# Patient Record
Sex: Female | Born: 1935 | Race: White | Hispanic: No | Marital: Married | Smoking: Former smoker
Health system: Southern US, Community
[De-identification: ages and names within clinical notes are randomized; demographics above are authoritative.]

## PROBLEM LIST (undated history)

## (undated) DIAGNOSIS — F0280 Dementia in other diseases classified elsewhere without behavioral disturbance: Secondary | ICD-10-CM

## (undated) DIAGNOSIS — E785 Hyperlipidemia, unspecified: Secondary | ICD-10-CM

## (undated) DIAGNOSIS — M8580 Other specified disorders of bone density and structure, unspecified site: Secondary | ICD-10-CM

## (undated) DIAGNOSIS — J189 Pneumonia, unspecified organism: Secondary | ICD-10-CM

## (undated) DIAGNOSIS — I10 Essential (primary) hypertension: Secondary | ICD-10-CM

## (undated) DIAGNOSIS — K5790 Diverticulosis of intestine, part unspecified, without perforation or abscess without bleeding: Secondary | ICD-10-CM

## (undated) DIAGNOSIS — C50912 Malignant neoplasm of unspecified site of left female breast: Secondary | ICD-10-CM

## (undated) DIAGNOSIS — G309 Alzheimer's disease, unspecified: Secondary | ICD-10-CM

## (undated) HISTORY — DX: Diverticulosis of intestine, part unspecified, without perforation or abscess without bleeding: K57.90

## (undated) HISTORY — DX: Essential (primary) hypertension: I10

## (undated) HISTORY — DX: Alzheimer's disease, unspecified: G30.9

## (undated) HISTORY — DX: Dementia in other diseases classified elsewhere, unspecified severity, without behavioral disturbance, psychotic disturbance, mood disturbance, and anxiety: F02.80

## (undated) HISTORY — DX: Other specified disorders of bone density and structure, unspecified site: M85.80

## (undated) HISTORY — DX: Hyperlipidemia, unspecified: E78.5

## (undated) HISTORY — PX: CATARACT EXTRACTION W/ INTRAOCULAR LENS IMPLANT: SHX1309

---

## 1998-02-18 ENCOUNTER — Other Ambulatory Visit: Admission: RE | Admit: 1998-02-18 | Discharge: 1998-02-18 | Payer: Self-pay | Admitting: Obstetrics & Gynecology

## 1998-12-16 ENCOUNTER — Encounter: Payer: Self-pay | Admitting: Family Medicine

## 1998-12-31 ENCOUNTER — Other Ambulatory Visit: Admission: RE | Admit: 1998-12-31 | Discharge: 1998-12-31 | Payer: Self-pay | Admitting: Internal Medicine

## 1999-06-13 ENCOUNTER — Emergency Department (HOSPITAL_COMMUNITY): Admission: EM | Admit: 1999-06-13 | Discharge: 1999-06-13 | Payer: Self-pay | Admitting: Emergency Medicine

## 1999-06-13 ENCOUNTER — Encounter: Payer: Self-pay | Admitting: Emergency Medicine

## 2000-01-13 ENCOUNTER — Other Ambulatory Visit: Admission: RE | Admit: 2000-01-13 | Discharge: 2000-01-13 | Payer: Self-pay | Admitting: Gastroenterology

## 2000-01-13 ENCOUNTER — Encounter (INDEPENDENT_AMBULATORY_CARE_PROVIDER_SITE_OTHER): Payer: Self-pay | Admitting: Specialist

## 2001-10-28 ENCOUNTER — Encounter: Payer: Self-pay | Admitting: Orthopaedic Surgery

## 2001-10-28 ENCOUNTER — Emergency Department (HOSPITAL_COMMUNITY): Admission: EM | Admit: 2001-10-28 | Discharge: 2001-10-28 | Payer: Self-pay | Admitting: Emergency Medicine

## 2004-01-21 ENCOUNTER — Ambulatory Visit: Payer: Self-pay | Admitting: Internal Medicine

## 2004-02-02 ENCOUNTER — Ambulatory Visit: Payer: Self-pay | Admitting: Internal Medicine

## 2005-01-12 ENCOUNTER — Ambulatory Visit: Payer: Self-pay | Admitting: Internal Medicine

## 2005-01-19 ENCOUNTER — Ambulatory Visit: Payer: Self-pay | Admitting: Internal Medicine

## 2005-02-14 DIAGNOSIS — C50912 Malignant neoplasm of unspecified site of left female breast: Secondary | ICD-10-CM

## 2005-02-14 HISTORY — DX: Malignant neoplasm of unspecified site of left female breast: C50.912

## 2005-02-14 HISTORY — PX: BREAST LUMPECTOMY: SHX2

## 2005-07-06 ENCOUNTER — Ambulatory Visit: Payer: Self-pay | Admitting: Internal Medicine

## 2005-07-29 ENCOUNTER — Ambulatory Visit: Payer: Self-pay | Admitting: Internal Medicine

## 2005-09-20 ENCOUNTER — Ambulatory Visit: Payer: Self-pay | Admitting: Cardiology

## 2005-09-20 ENCOUNTER — Ambulatory Visit: Payer: Self-pay | Admitting: Internal Medicine

## 2005-09-20 DIAGNOSIS — G252 Other specified forms of tremor: Secondary | ICD-10-CM

## 2005-09-20 DIAGNOSIS — Z8669 Personal history of other diseases of the nervous system and sense organs: Secondary | ICD-10-CM

## 2005-09-20 DIAGNOSIS — G250 Essential tremor: Secondary | ICD-10-CM

## 2005-09-27 ENCOUNTER — Ambulatory Visit: Payer: Self-pay | Admitting: Family Medicine

## 2005-11-28 ENCOUNTER — Ambulatory Visit: Payer: Self-pay | Admitting: Family Medicine

## 2006-01-18 ENCOUNTER — Encounter: Payer: Self-pay | Admitting: Family Medicine

## 2006-05-12 ENCOUNTER — Encounter: Payer: Self-pay | Admitting: Family Medicine

## 2006-05-12 DIAGNOSIS — I10 Essential (primary) hypertension: Secondary | ICD-10-CM

## 2006-05-12 DIAGNOSIS — M8580 Other specified disorders of bone density and structure, unspecified site: Secondary | ICD-10-CM

## 2006-05-12 DIAGNOSIS — E7800 Pure hypercholesterolemia, unspecified: Secondary | ICD-10-CM

## 2006-05-12 DIAGNOSIS — K5730 Diverticulosis of large intestine without perforation or abscess without bleeding: Secondary | ICD-10-CM | POA: Insufficient documentation

## 2006-05-12 DIAGNOSIS — N952 Postmenopausal atrophic vaginitis: Secondary | ICD-10-CM | POA: Insufficient documentation

## 2006-11-08 ENCOUNTER — Encounter (INDEPENDENT_AMBULATORY_CARE_PROVIDER_SITE_OTHER): Payer: Self-pay | Admitting: *Deleted

## 2006-11-09 ENCOUNTER — Ambulatory Visit: Payer: Self-pay | Admitting: Family Medicine

## 2006-11-14 ENCOUNTER — Encounter: Payer: Self-pay | Admitting: Family Medicine

## 2007-01-10 ENCOUNTER — Ambulatory Visit: Payer: Self-pay | Admitting: *Deleted

## 2007-01-10 ENCOUNTER — Observation Stay (HOSPITAL_COMMUNITY): Admission: EM | Admit: 2007-01-10 | Discharge: 2007-01-10 | Payer: Self-pay | Admitting: Emergency Medicine

## 2007-01-17 ENCOUNTER — Ambulatory Visit: Payer: Self-pay | Admitting: Family Medicine

## 2007-01-26 ENCOUNTER — Ambulatory Visit: Payer: Self-pay | Admitting: Cardiology

## 2007-01-26 ENCOUNTER — Ambulatory Visit (HOSPITAL_COMMUNITY): Admission: RE | Admit: 2007-01-26 | Discharge: 2007-01-26 | Payer: Self-pay | Admitting: Cardiology

## 2007-05-21 ENCOUNTER — Encounter: Payer: Self-pay | Admitting: Family Medicine

## 2007-06-01 ENCOUNTER — Encounter (INDEPENDENT_AMBULATORY_CARE_PROVIDER_SITE_OTHER): Payer: Self-pay | Admitting: *Deleted

## 2007-06-20 ENCOUNTER — Ambulatory Visit: Payer: Self-pay | Admitting: Family Medicine

## 2007-06-20 DIAGNOSIS — L821 Other seborrheic keratosis: Secondary | ICD-10-CM

## 2007-08-02 ENCOUNTER — Ambulatory Visit: Payer: Self-pay | Admitting: Cardiology

## 2008-01-24 ENCOUNTER — Ambulatory Visit: Payer: Self-pay | Admitting: Family Medicine

## 2008-01-24 DIAGNOSIS — L259 Unspecified contact dermatitis, unspecified cause: Secondary | ICD-10-CM | POA: Insufficient documentation

## 2008-01-29 ENCOUNTER — Ambulatory Visit: Payer: Self-pay | Admitting: Family Medicine

## 2008-01-30 LAB — CONVERTED CEMR LAB
ALT: 12 units/L (ref 0–35)
Bilirubin, Direct: 0.1 mg/dL (ref 0.0–0.3)
CO2: 31 meq/L (ref 19–32)
Calcium: 9.4 mg/dL (ref 8.4–10.5)
Chloride: 104 meq/L (ref 96–112)
Creatinine, Ser: 0.6 mg/dL (ref 0.4–1.2)
Direct LDL: 152.3 mg/dL
GFR calc non Af Amer: 104 mL/min
Sodium: 140 meq/L (ref 135–145)
Total Bilirubin: 0.8 mg/dL (ref 0.3–1.2)
Total CHOL/HDL Ratio: 4
VLDL: 13 mg/dL (ref 0–40)

## 2008-02-19 ENCOUNTER — Encounter: Payer: Self-pay | Admitting: Family Medicine

## 2008-02-19 ENCOUNTER — Ambulatory Visit: Payer: Self-pay | Admitting: Internal Medicine

## 2008-02-19 ENCOUNTER — Ambulatory Visit: Payer: Self-pay | Admitting: Gastroenterology

## 2008-03-05 ENCOUNTER — Ambulatory Visit: Payer: Self-pay | Admitting: Gastroenterology

## 2008-03-05 ENCOUNTER — Encounter: Payer: Self-pay | Admitting: Gastroenterology

## 2008-03-06 ENCOUNTER — Encounter: Payer: Self-pay | Admitting: Gastroenterology

## 2008-03-31 ENCOUNTER — Ambulatory Visit: Payer: Self-pay | Admitting: Family Medicine

## 2008-05-05 ENCOUNTER — Encounter (INDEPENDENT_AMBULATORY_CARE_PROVIDER_SITE_OTHER): Payer: Self-pay | Admitting: *Deleted

## 2008-05-09 ENCOUNTER — Ambulatory Visit: Payer: Self-pay | Admitting: Family Medicine

## 2008-05-14 LAB — CONVERTED CEMR LAB
Cholesterol: 224 mg/dL — ABNORMAL HIGH (ref 0–200)
HDL: 46.8 mg/dL (ref >39.00)
Total CHOL/HDL Ratio: 5
VLDL: 20.4 mg/dL (ref 0.0–40.0)

## 2008-05-22 ENCOUNTER — Encounter: Payer: Self-pay | Admitting: Family Medicine

## 2008-05-26 ENCOUNTER — Encounter: Payer: Self-pay | Admitting: Family Medicine

## 2008-05-27 ENCOUNTER — Encounter: Payer: Self-pay | Admitting: Family Medicine

## 2008-07-01 ENCOUNTER — Encounter: Payer: Self-pay | Admitting: Family Medicine

## 2008-07-02 ENCOUNTER — Encounter: Payer: Self-pay | Admitting: Family Medicine

## 2008-07-03 ENCOUNTER — Telehealth: Payer: Self-pay | Admitting: Family Medicine

## 2008-07-04 ENCOUNTER — Ambulatory Visit: Payer: Self-pay | Admitting: Family Medicine

## 2008-07-10 ENCOUNTER — Encounter: Admission: RE | Admit: 2008-07-10 | Discharge: 2008-07-10 | Payer: Self-pay | Admitting: Otolaryngology

## 2008-07-10 LAB — CONVERTED CEMR LAB
BUN: 14 mg/dL (ref 6–23)
Calcium: 9.8 mg/dL (ref 8.4–10.5)
Creatinine, Ser: 0.71 mg/dL (ref 0.40–1.20)
Glucose, Bld: 87 mg/dL (ref 70–99)
Potassium: 5.1 meq/L (ref 3.5–5.3)

## 2008-07-11 DIAGNOSIS — D0512 Intraductal carcinoma in situ of left breast: Secondary | ICD-10-CM | POA: Insufficient documentation

## 2008-07-15 ENCOUNTER — Encounter: Admission: RE | Admit: 2008-07-15 | Discharge: 2008-07-15 | Payer: Self-pay | Admitting: Surgery

## 2008-07-17 ENCOUNTER — Ambulatory Visit (HOSPITAL_BASED_OUTPATIENT_CLINIC_OR_DEPARTMENT_OTHER): Admission: RE | Admit: 2008-07-17 | Discharge: 2008-07-17 | Payer: Self-pay | Admitting: Surgery

## 2008-07-17 ENCOUNTER — Encounter: Admission: RE | Admit: 2008-07-17 | Discharge: 2008-07-17 | Payer: Self-pay | Admitting: Surgery

## 2008-07-17 ENCOUNTER — Encounter (INDEPENDENT_AMBULATORY_CARE_PROVIDER_SITE_OTHER): Payer: Self-pay | Admitting: Surgery

## 2008-07-24 ENCOUNTER — Encounter: Payer: Self-pay | Admitting: Family Medicine

## 2008-07-28 ENCOUNTER — Ambulatory Visit: Payer: Self-pay | Admitting: Oncology

## 2008-08-13 ENCOUNTER — Ambulatory Visit: Payer: Self-pay | Admitting: Family Medicine

## 2008-08-19 ENCOUNTER — Encounter: Payer: Self-pay | Admitting: Family Medicine

## 2008-08-19 ENCOUNTER — Ambulatory Visit: Admission: RE | Admit: 2008-08-19 | Discharge: 2008-10-15 | Payer: Self-pay | Admitting: Radiation Oncology

## 2008-08-19 LAB — CBC WITH DIFFERENTIAL (CANCER CENTER ONLY)
BASO#: 0.1 10*3/uL (ref 0.0–0.2)
HCT: 38.9 % (ref 34.8–46.6)
HGB: 13.7 g/dL (ref 11.6–15.9)
LYMPH#: 1.8 10*3/uL (ref 0.9–3.3)
MCHC: 35.2 g/dL (ref 32.0–36.0)
MONO#: 0.5 10*3/uL (ref 0.1–0.9)
NEUT%: 67.2 % (ref 39.6–80.0)

## 2008-08-19 LAB — CMP (CANCER CENTER ONLY)
ALT(SGPT): 16 U/L (ref 10–47)
Albumin: 3.5 g/dL (ref 3.3–5.5)
CO2: 29 mEq/L (ref 18–33)
Calcium: 9.5 mg/dL (ref 8.0–10.3)
Chloride: 97 mEq/L — ABNORMAL LOW (ref 98–108)
Sodium: 138 mEq/L (ref 128–145)
Total Protein: 7.1 g/dL (ref 6.4–8.1)

## 2008-08-20 ENCOUNTER — Encounter: Payer: Self-pay | Admitting: Family Medicine

## 2008-08-20 LAB — CONVERTED CEMR LAB
AST: 17 units/L (ref 0–37)
Cholesterol: 151 mg/dL (ref 0–200)
Triglycerides: 61 mg/dL (ref 0.0–149.0)

## 2008-10-09 ENCOUNTER — Ambulatory Visit: Payer: Self-pay | Admitting: Oncology

## 2008-10-12 ENCOUNTER — Encounter: Payer: Self-pay | Admitting: Family Medicine

## 2008-10-14 ENCOUNTER — Encounter: Payer: Self-pay | Admitting: Family Medicine

## 2008-10-14 LAB — CBC WITH DIFFERENTIAL (CANCER CENTER ONLY)
BASO%: 0.5 % (ref 0.0–2.0)
HCT: 39.7 % (ref 34.8–46.6)
HGB: 13.4 g/dL (ref 11.6–15.9)
LYMPH#: 1.2 10*3/uL (ref 0.9–3.3)
MONO#: 0.3 10*3/uL (ref 0.1–0.9)
NEUT%: 68.3 % (ref 39.6–80.0)
RBC: 4.16 10*6/uL (ref 3.70–5.32)
RDW: 11.4 % (ref 10.5–14.6)
WBC: 5.3 10*3/uL (ref 3.9–10.0)

## 2008-10-14 LAB — CMP (CANCER CENTER ONLY)
BUN, Bld: 11 mg/dL (ref 7–22)
CO2: 31 mEq/L (ref 18–33)
Calcium: 9.3 mg/dL (ref 8.0–10.3)
Chloride: 99 mEq/L (ref 98–108)
Creat: 0.7 mg/dl (ref 0.6–1.2)
Total Bilirubin: 0.5 mg/dl (ref 0.20–1.60)

## 2008-12-11 ENCOUNTER — Ambulatory Visit: Payer: Self-pay | Admitting: Oncology

## 2008-12-16 ENCOUNTER — Encounter: Payer: Self-pay | Admitting: Family Medicine

## 2008-12-16 LAB — CBC WITH DIFFERENTIAL (CANCER CENTER ONLY)
BASO%: 0.5 % (ref 0.0–2.0)
LYMPH#: 1.6 10*3/uL (ref 0.9–3.3)
LYMPH%: 23.4 % (ref 14.0–48.0)
MONO#: 0.4 10*3/uL (ref 0.1–0.9)
NEUT#: 4.6 10*3/uL (ref 1.5–6.5)
Platelets: 249 10*3/uL (ref 145–400)
RDW: 11.2 % (ref 10.5–14.6)
WBC: 6.7 10*3/uL (ref 3.9–10.0)

## 2008-12-16 LAB — CMP (CANCER CENTER ONLY)
ALT(SGPT): 17 U/L (ref 10–47)
Alkaline Phosphatase: 60 U/L (ref 26–84)
CO2: 30 mEq/L (ref 18–33)
Creat: 0.6 mg/dl (ref 0.6–1.2)
Sodium: 138 mEq/L (ref 128–145)
Total Bilirubin: 0.3 mg/dl (ref 0.20–1.60)
Total Protein: 6.7 g/dL (ref 6.4–8.1)

## 2009-02-18 ENCOUNTER — Encounter: Payer: Self-pay | Admitting: Family Medicine

## 2009-03-03 ENCOUNTER — Ambulatory Visit: Payer: Self-pay | Admitting: Family Medicine

## 2009-06-05 ENCOUNTER — Encounter: Payer: Self-pay | Admitting: Family Medicine

## 2009-06-09 LAB — HM MAMMOGRAPHY

## 2009-07-23 ENCOUNTER — Ambulatory Visit: Payer: Self-pay | Admitting: Family Medicine

## 2009-07-24 LAB — CONVERTED CEMR LAB
AST: 17 units/L (ref 0–37)
Albumin: 3.8 g/dL (ref 3.5–5.2)
BUN: 10 mg/dL (ref 6–23)
Calcium: 9 mg/dL (ref 8.4–10.5)
Cholesterol: 190 mg/dL (ref 0–200)
Creatinine, Ser: 0.5 mg/dL (ref 0.4–1.2)
GFR calc non Af Amer: 117.39 mL/min (ref >60)
Glucose, Bld: 88 mg/dL (ref 70–99)
HDL: 50.3 mg/dL (ref >39.00)
LDL Cholesterol: 111 mg/dL — ABNORMAL HIGH (ref 0–99)
Potassium: 4.5 meq/L (ref 3.5–5.1)
Total Bilirubin: 0.6 mg/dL (ref 0.3–1.2)
Triglycerides: 145 mg/dL (ref 0.0–149.0)
VLDL: 29 mg/dL (ref 0.0–40.0)

## 2009-07-31 ENCOUNTER — Ambulatory Visit: Payer: Self-pay | Admitting: Family Medicine

## 2009-07-31 LAB — CONVERTED CEMR LAB

## 2009-09-16 ENCOUNTER — Encounter: Payer: Self-pay | Admitting: Family Medicine

## 2009-11-10 ENCOUNTER — Ambulatory Visit: Payer: Self-pay | Admitting: Family Medicine

## 2009-11-10 DIAGNOSIS — L84 Corns and callosities: Secondary | ICD-10-CM | POA: Insufficient documentation

## 2009-12-10 ENCOUNTER — Ambulatory Visit: Payer: Self-pay | Admitting: Oncology

## 2009-12-16 ENCOUNTER — Encounter: Payer: Self-pay | Admitting: Family Medicine

## 2009-12-16 LAB — CBC WITH DIFFERENTIAL (CANCER CENTER ONLY)
BASO%: 0.4 % (ref 0.0–2.0)
EOS%: 2.2 % (ref 0.0–7.0)
HCT: 37.1 % (ref 34.8–46.6)
LYMPH%: 26.5 % (ref 14.0–48.0)
MCH: 32.7 pg (ref 26.0–34.0)
MCHC: 34.5 g/dL (ref 32.0–36.0)
MCV: 95 fL (ref 81–101)
MONO%: 8 % (ref 0.0–13.0)
NEUT%: 62.9 % (ref 39.6–80.0)
RDW: 11.3 % (ref 10.5–14.6)

## 2009-12-16 LAB — CMP (CANCER CENTER ONLY)
ALT(SGPT): 19 U/L (ref 10–47)
AST: 18 U/L (ref 11–38)
CO2: 30 mEq/L (ref 18–33)
Creat: 0.6 mg/dl (ref 0.6–1.2)
Total Bilirubin: 0.5 mg/dl (ref 0.20–1.60)

## 2010-01-26 ENCOUNTER — Ambulatory Visit: Payer: Self-pay | Admitting: Family Medicine

## 2010-03-08 ENCOUNTER — Encounter: Payer: Self-pay | Admitting: Radiation Oncology

## 2010-03-12 ENCOUNTER — Ambulatory Visit: Payer: Self-pay | Admitting: Oncology

## 2010-03-16 ENCOUNTER — Encounter: Payer: Self-pay | Admitting: Family Medicine

## 2010-03-16 LAB — CBC WITH DIFFERENTIAL/PLATELET
BASO%: 0.5 % (ref 0.0–2.0)
Basophils Absolute: 0 10*3/uL (ref 0.0–0.1)
EOS%: 1.4 % (ref 0.0–7.0)
HGB: 13.1 g/dL (ref 11.6–15.9)
MCH: 33 pg (ref 25.1–34.0)
MONO#: 0.5 10*3/uL (ref 0.1–0.9)
RDW: 12.9 % (ref 11.2–14.5)
WBC: 6.7 10*3/uL (ref 3.9–10.3)
lymph#: 1.4 10*3/uL (ref 0.9–3.3)

## 2010-03-16 LAB — COMPREHENSIVE METABOLIC PANEL
ALT: <8 U/L (ref 0–35)
AST: 14 U/L (ref 0–37)
Albumin: 4 g/dL (ref 3.5–5.2)
BUN: 11 mg/dL (ref 6–23)
CO2: 27 mEq/L (ref 19–32)
Calcium: 9.3 mg/dL (ref 8.4–10.5)
Chloride: 101 mEq/L (ref 96–112)
Potassium: 4.6 mEq/L (ref 3.5–5.3)

## 2010-03-16 NOTE — Assessment & Plan Note (Signed)
Summary: place on big toe on left foot/rbh   Vital Signs:  Patient profile:   77 year old female Height:      67 inches Weight:      160.0 pounds BMI:     25.15 Temp:     98.8 degrees F oral Pulse rate:   64 / minute Pulse rhythm:   regular BP sitting:   140 / 78  (left arm) Cuff size:   regular  Vitals Entered By: Benny Lennert CMA Duncan Dull) (November 10, 2009 2:21 PM)  History of Present Illness: Chief complaint place on bottom of left great toe  At base of left great toe...large callus, come bloody discoloration.  Minimally painful.  No discharge.  No fever.  No redness on feet.   No personal history of diabetes.  Problems Prior to Update: 1)  Preventive Health Care  (ICD-V70.0) 2)  Carcinoma in Situ of Breast  (ICD-233.0) 3)  Dermatitis, Hands  (ICD-692.9) 4)  Seborrheic Keratosis  (ICD-702.19) 5)  Syncope, Hx of  (ICD-V12.49) 6)  Vaginitis, Atrophic  (ICD-627.3) 7)  Hypercholesterolemia  (ICD-272.0) 8)  Tremor, Essential  (ICD-333.1) 9)  Bradycardia / Atenolol  (ICD-427.89) 10)  Osteopenia  (ICD-733.90) 11)  Hypertension  (ICD-401.9) 12)  Diverticulosis, Colon  (ICD-562.10)  Current Medications (verified): 1)  Atenolol 25 Mg  Tabs (Atenolol) .... Take One By Mouth Once A Day 2)  Lisinopril 10 Mg  Tabs (Lisinopril) .... Take 1 Tablet By Mouth Once A Day 3)  Calcium Carbonate-Vitamin D 600-400 Mg-Unit  Tabs (Calcium Carbonate-Vitamin D) .... Take 1 Tablet By Mouth Once A Day 4)  Tamoxifen Citrate 20 Mg Tabs (Tamoxifen Citrate) .... Take 1 Tablet By Mouth Once A Day  Allergies: 1)  ! Sulfa  Past History:  Past medical, surgical, family and social histories (including risk factors) reviewed, and no changes noted (except as noted below).  Past Medical History: Reviewed history from 05/12/2006 and no changes required. Diverticulosis, colon Hypertension Osteopenia  Past Surgical History: Reviewed history from 03/03/2009 and no changes required. DEXA T 1-5  2006 HEAD CT NEG 09/2005 CXR NEG 1994 ENROLLED IN CLIP PROGRAM [id number] left breast lumpectomy   Family History: Reviewed history from 06/20/2007 and no changes required. no family  hx of skin cancer  Social History: Reviewed history and no changes required.  Review of Systems General:  Denies fatigue and fever. CV:  Denies chest pain or discomfort and swelling of feet. Resp:  Denies shortness of breath.  Physical Exam  General:  Well-developed,well-nourished,in no acute distress; alert,appropriate and cooperative throughout examination Msk:  left great toe...at base of toe pad..lagre callus with rough edges. Also right 4th digit with corn between toes. Cleaned areas with alcohol, debrided with 15 blade.  No bleeding, no pain Pulses:  R and L posterior tibial pulses are full and equal bilaterally  Extremities:  no edmea   Impression & Recommendations:  Problem # 1:  CALLUS, TOE (ICD-700) Debrided two areas of callus...procedure note in PE section.   Complete Medication List: 1)  Atenolol 25 Mg Tabs (Atenolol) .... Take one by mouth once a day 2)  Lisinopril 10 Mg Tabs (Lisinopril) .... Take 1 tablet by mouth once a day 3)  Calcium Carbonate-vitamin D 600-400 Mg-unit Tabs (Calcium carbonate-vitamin d) .... Take 1 tablet by mouth once a day 4)  Tamoxifen Citrate 20 Mg Tabs (Tamoxifen citrate) .... Take 1 tablet by mouth once a day  Other Orders: Shave Skin Lesion < 0.5 cm/trunk/arm/leg (11300)  Patient Instructions: 1)  Kersal to soften feet nightly. 2)   Consider wearing corn pads to prevent areas from rubbing on shoes and other toes.  3)  Follow up as needed.   Current Allergies (reviewed today): ! SULFA Flu Vaccine Next Due:  Refused

## 2010-03-16 NOTE — Letter (Signed)
Summary: [facility]  [facility]   Imported By: Lanelle Bal 12/28/2009 12:24:47  _____________________________________________________________________  External Attachment:    Type:   Image     Comment:   External Document

## 2010-03-16 NOTE — Letter (Signed)
Summary: [facility]   Imported By: Lester  10/21/2009 12:41:30  _____________________________________________________________________  External Attachment:    Type:   Image     Comment:   External Document

## 2010-03-16 NOTE — Assessment & Plan Note (Signed)
Summary: REFILL MEDICATION/CLE   Vital Signs:  Patient profile:   77 year old female Height:      67 inches Weight:      157.0 pounds BMI:     24.68 Temp:     99.0 degrees F oral Pulse rate:   60 / minute Pulse rhythm:   regular BP sitting:   130 / 70  (left arm) Cuff size:   regular  Vitals Entered By: Benny Lennert CMA Duncan Dull) (March 03, 2009 2:24 PM)  History of Present Illness: Chief complaint refill medication  Left breast adenocarcinoma... s/p lumpectomy and radiation. Sees Dr. Welton Flakes...on tamoxifen..  Stopped alendronate for  osteoporosis.Marland Kitchennow on tamoxifen which may help...recheck next year.   Hypertension History:      She denies headache, chest pain, dyspnea with exertion, peripheral edema, and side effects from treatment.  well controlled at home .        Positive major cardiovascular risk factors include female age 20 years old or older, hyperlipidemia, and hypertension.  Negative major cardiovascular risk factors include non-tobacco-user status.     Problems Prior to Update: 1)  Carcinoma in Situ of Breast  (ICD-233.0) 2)  Screening For Lipoid Disorders  (ICD-V77.91) 3)  Dermatitis, Hands  (ICD-692.9) 4)  Seborrheic Keratosis  (ICD-702.19) 5)  Laceration, Scalp  (ICD-873.0) 6)  Syncope, Hx of  (ICD-V12.49) 7)  Vaginitis, Atrophic  (ICD-627.3) 8)  Hypercholesterolemia  (ICD-272.0) 9)  Syncope  (ICD-780.2) 10)  Tremor, Essential  (ICD-333.1) 11)  Bradycardia / Atenolol  (ICD-427.89) 12)  Osteopenia  (ICD-733.90) 13)  Hypertension  (ICD-401.9) 14)  Diverticulosis, Colon  (ICD-562.10)  Current Medications (verified): 1)  Atenolol 25 Mg  Tabs (Atenolol) .... Take One By Mouth Once A Day 2)  Lisinopril 10 Mg  Tabs (Lisinopril) .... Take 1 Tablet By Mouth Once A Day 3)  Calcium Carbonate-Vitamin D 600-400 Mg-Unit  Tabs (Calcium Carbonate-Vitamin D) .... Take 1 Tablet By Mouth Once A Day  Allergies: 1)  ! Sulfa  Past History:  Past medical, surgical,  family and social histories (including risk factors) reviewed, and no changes noted (except as noted below).  Past Medical History: Reviewed history from 05/12/2006 and no changes required. Diverticulosis, colon Hypertension Osteopenia  Past Surgical History: DEXA T 1-5 2006 HEAD CT NEG 09/2005 CXR NEG 1994 ENROLLED IN CLIP PROGRAM [id number] left breast lumpectomy   Family History: Reviewed history from 06/20/2007 and no changes required. no family  hx of skin cancer  Social History: Reviewed history and no changes required.  Review of Systems General:  Denies fatigue and fever. CV:  Denies chest pain or discomfort. Resp:  Denies shortness of breath. GI:  Denies abdominal pain.  Physical Exam  General:  overweight appearing female in NAD, with tremor Mouth:  MMM Neck:  .nekc no cervical or supraclavicular lymphadenopathy  Lungs:  Normal respiratory effort, chest expands symmetrically. Lungs are clear to auscultation, no crackles or wheezes. Heart:  Normal rate and regular rhythm. S1 and S2 normal without gallop, murmur, click, rub or other extra sounds. Abdomen:  Bowel sounds positive,abdomen soft and non-tender without masses, organomegaly or hernias noted. Pulses:  R and L posterior tibial pulses are full and equal bilaterally  Extremities:  no edmea  Skin:  dorsal surface of B hands red, cracked, dry flacky multiple SK and skin tags. Psych:  Cognition and judgment appear intact. Alert and cooperative with normal attention span and concentration. No apparent delusions, illusions, hallucinations   Impression & Recommendations:  Problem #  1:  HYPERTENSION (ICD-401.9) Well controlled on current medcation, refilled.  Her updated medication list for this problem includes:    Atenolol 25 Mg Tabs (Atenolol) .Marland Kitchen... Take one by mouth once a day    Lisinopril 10 Mg Tabs (Lisinopril) .Marland Kitchen... Take 1 tablet by mouth once a day  Problem # 2:  HYPERCHOLESTEROLEMIA (ICD-272.0)  Due  for reeval in 6 months.   Labs Reviewed: SGOT: 17 (08/13/2008)   SGPT: 12 (08/13/2008)  Prior 10 Yr Risk Heart Disease: 7 % (01/24/2008)   HDL:53.10 (08/13/2008), 46.80 (05/09/2008)  LDL:86 (08/13/2008), DEL (84/13/2440)  Chol:151 (08/13/2008), 224 (05/09/2008)  Trig:61.0 (08/13/2008), 102.0 (05/09/2008)  Problem # 3:  OSTEOPENIA (ICD-733.90) On tamoxifen....due for DXA in 1 year.  The following medications were removed from the medication list:    Alendronate Sodium 70 Mg Tabs (Alendronate sodium) .Marland Kitchen... 1 tab by mouth weekly Her updated medication list for this problem includes:    Calcium Carbonate-vitamin D 600-400 Mg-unit Tabs (Calcium carbonate-vitamin d) .Marland Kitchen... Take 1 tablet by mouth once a day  Complete Medication List: 1)  Atenolol 25 Mg Tabs (Atenolol) .... Take one by mouth once a day 2)  Lisinopril 10 Mg Tabs (Lisinopril) .... Take 1 tablet by mouth once a day 3)  Calcium Carbonate-vitamin D 600-400 Mg-unit Tabs (Calcium carbonate-vitamin d) .... Take 1 tablet by mouth once a day  Hypertension Assessment/Plan:      The patient's hypertensive risk group is category B: At least one risk factor (excluding diabetes) with no target organ damage.  Her calculated 10 year risk of coronary heart disease is 7 %.  Today's blood pressure is 130/70.  Her blood pressure goal is < 140/90.  Patient Instructions: 1)  Schedule CPE in June with fasting lipids. CMET Dx 272.0 prior.  Prescriptions: LISINOPRIL 10 MG  TABS (LISINOPRIL) Take 1 tablet by mouth once a day  #30 x 11   Entered and Authorized by:   Kerby Nora MD   Signed by:   Kerby Nora MD on 03/03/2009   Method used:   Electronically to        [facility]* (retail)       [id number]       [place], [place]  [address]       Ph: [phone]       Fax: [phone]   RxID:   [id number]   Current Allergies (reviewed today): ! SULFA

## 2010-03-16 NOTE — Assessment & Plan Note (Signed)
Summary: cpx/dlo   Vital Signs:  Patient profile:   77 year old female Height:      67 inches Weight:      159.8 pounds BMI:     25.12 Temp:     98.2 degrees F oral Pulse rate:   60 / minute Pulse rhythm:   regular BP sitting:   120 / 82  (left arm) Cuff size:   regular  Vitals Entered By: Benny Lennert CMA Duncan Dull) (July 31, 2009 1:46 PM) CC: first annual wellness visit, Hypertension Management  Does patient need assistance? Functional Status Self care Ambulation Normal Comments No recent falls, reviewed home safety precautions.   Vision Screening:      Vision Comments: wear glasses  Hearing Screen  20db HL: Left  Right  Audiometry Comment: can hear whisper 6 feet away   Hearing Testing Entered By: Benny Lennert CMA Duncan Dull) (July 31, 2009 1:48 PM)     Last PAP Date 07/31/2009 Last PAP Result DVE no pap q 3 years because breast cancer   Prevention & Chronic Care Immunizations   Influenza vaccine: Not documented   Influenza vaccine due: Refused  (01/24/2008)    Tetanus booster: 01/24/2008: Tdap   Tetanus booster due: 11/28/2007    Pneumococcal vaccine: Pneumovax (Medicare)  (01/24/2008)   Pneumococcal vaccine deferral: Not indicated  (07/31/2009)   Pneumococcal vaccine due: None    H. zoster vaccine: Not documented   H. zoster vaccine deferral: Not available  (07/31/2009)  Colorectal Screening   Hemoccult: Not documented   Hemoccult due: Not Indicated    Colonoscopy: Location:  [facility].    (03/05/2008)   Colonoscopy due: 03/06/2011  Other Screening   Pap smear: DVE no pap q 3 years because breast cancer  (07/31/2009)   Pap smear due: 07/31/2012    Mammogram: normal B diagnostic  (06/09/2009)   Mammogram due: 06/10/2010    DXA bone density scan: abnormal, osteopenia in femur  (02/22/2008)   DXA scan due: 02/21/2010    Smoking status: quit  (05/12/2006)  Lipids   Total Cholesterol: 190  (07/23/2009)   LDL: 111   (07/23/2009)   LDL Direct: 160.5  (05/09/2008)   HDL: 50.30  (07/23/2009)   Triglycerides: 145.0  (07/23/2009)   Lipid panel due: 08/01/2010    SGOT (AST): 17  (07/23/2009)   SGPT (ALT): 13  (07/23/2009)   Alkaline phosphatase: 47  (07/23/2009)   Total bilirubin: 0.6  (07/23/2009)   Liver panel due: 08/01/2010    Lipid flowsheet reviewed?: Yes   Progress toward LDL goal: At goal  Hypertension   Last Blood Pressure: 120 / 82  (07/31/2009)   Serum creatinine: 0.5  (07/23/2009)   Serum potassium 4.5  (07/23/2009)   Basic metabolic panel due: 08/01/2010    Hypertension flowsheet reviewed?: Yes   Progress toward BP goal: At goal  Self-Management Support :    Hypertension self-management support: Not documented    Lipid self-management support: Not documented    History of Present Illness: Here for Medicare AWV:  1.   Risk factors based on Past M, S, F history: 2.   Physical Activities:  3.   Depression/mood:  4.   Hearing:  5.   ADL's:  6.   Fall Risk:  7.   Home Safety:  8.   Height, weight, &visual acuity: 9.   Counseling:  10.   Labs ordered based on risk factors:  11.  Referral Coordination 12.           Care Plan 13.            Cognitive Assessment   Left breast adenocarcinoma... s/p lumpectomy and radiation. Sees Dr. Welton Flakes...on tamoxifen..  Stopped alendronate for  osteoporosis.Marland Kitchennow on tamoxifen which may help.    Hypertension History:      She denies headache, palpitations, peripheral edema, neurologic problems, and side effects from treatment.  well controlled at home .        Positive major cardiovascular risk factors include female age 82 years old or older, hyperlipidemia, and hypertension.  Negative major cardiovascular risk factors include non-tobacco-user status.     Preventive Screening-Counseling & Management  Alcohol-Tobacco     Alcohol drinks/day: 0     Alcohol Counseling: not indicated; patient does not  drink  Caffeine-Diet-Exercise     Diet Counseling: not indicated; diet is assessed to be healthy     Does Patient Exercise: yes     Type of exercise: walking     Exercise (avg: min/session): 20-30 min     Times/week: 4     Exercise Counseling: not indicated; exercise is adequate     [facility] Depression Score: none      Depression Counseling: not indicated; screening negative for depression  Problems Prior to Update: 1)  Carcinoma in Situ of Breast  (ICD-233.0) 2)  Dermatitis, Hands  (ICD-692.9) 3)  Seborrheic Keratosis  (ICD-702.19) 4)  Syncope, Hx of  (ICD-V12.49) 5)  Vaginitis, Atrophic  (ICD-627.3) 6)  Hypercholesterolemia  (ICD-272.0) 7)  Syncope  (ICD-780.2) 8)  Tremor, Essential  (ICD-333.1) 9)  Bradycardia / Atenolol  (ICD-427.89) 10)  Osteopenia  (ICD-733.90) 11)  Hypertension  (ICD-401.9) 12)  Diverticulosis, Colon  (ICD-562.10)  Current Medications (verified): 1)  Atenolol 25 Mg  Tabs (Atenolol) .... Take One By Mouth Once A Day 2)  Lisinopril 10 Mg  Tabs (Lisinopril) .... Take 1 Tablet By Mouth Once A Day 3)  Calcium Carbonate-Vitamin D 600-400 Mg-Unit  Tabs (Calcium Carbonate-Vitamin D) .... Take 1 Tablet By Mouth Once A Day  Allergies: 1)  ! Sulfa  Past History:  Past medical, surgical, family and social histories (including risk factors) reviewed, and no changes noted (except as noted below).  Past Medical History: Reviewed history from 05/12/2006 and no changes required. Diverticulosis, colon Hypertension Osteopenia  Past Surgical History: Reviewed history from 03/03/2009 and no changes required. DEXA T 1-5 2006 HEAD CT NEG 09/2005 CXR NEG 1994 ENROLLED IN CLIP PROGRAM [id number] left breast lumpectomy   Family History: Reviewed history from 06/20/2007 and no changes required. no family  hx of skin cancer  Social History: Reviewed history and no changes required. Does Patient Exercise:  yes  Review of Systems       No further syncopal events.   General:  Complains of fatigue; denies fever. Resp:  Denies shortness of breath. GI:  Denies abdominal pain. GU:  Denies abnormal vaginal bleeding and dysuria. Derm:  Denies rash.  Physical Exam  General:  overweight appearing female in NAD, with tremor Ears:  External ear exam shows no significant lesions or deformities.  Otoscopic examination reveals clear canals, tympanic membranes are intact bilaterally without bulging, retraction, inflammation or discharge. Hearing is grossly normal bilaterally. Nose:  External nasal examination shows no deformity or inflammation. Nasal mucosa are pink and moist without lesions or exudates. Mouth:  MMM Neck:  no carotid bruit or thyromegaly no cervical or supraclavicular lymphadenopathy  Chest Wall:  No  deformities, masses, or tenderness noted. Breasts:  No mass, nodules, thickening, tenderness, bulging, retraction, inflamation, nipple discharge or skin changes noted.   Lungs:  Normal respiratory effort, chest expands symmetrically. Lungs are clear to auscultation, no crackles or wheezes. Heart:  Normal rate and regular rhythm. S1 and S2 normal without gallop, murmur, click, rub or other extra sounds. Genitalia:  normal introitus, cystocele, and rectocele, mild vaginal prolapsenormal introitus, no external lesions, normal uterus size and position, no adnexal masses or tenderness, cystocele, rectocele, and vaginal atrophy.   Pulses:  R and L posterior tibial pulses are full and equal bilaterally  Extremities:  no edema Neurologic:  alert & oriented X3.   Skin:  dorsal surface of B hands red, cracked, dry flacky multiple SK and skin tags. Psych:  Cognition and judgment appear intact. Alert and cooperative with normal attention span and concentration. No apparent delusions, illusions, hallucinations   Impression & Recommendations:  Problem # 1:  Preventive Health Care (ICD-V70.0) The patient's preventative maintenance and recommended screening tests  for an annual wellness exam were reviewed in full today. Brought up to date unless services declined.  Counselled on the importance of diet, exercise, and its role in overall health and mortality. The patient's FH and SH was reviewed, including their home life, tobacco status, and drug and alcohol status.     Problem # 2:  HYPERCHOLESTEROLEMIA (ICD-272.0) Slightly worsening control...but LDL remains at goal. Reviewed diet cand lifestyle changes.   Problem # 3:  HYPERTENSION (ICD-401.9) Well controlled. Continue current medication.  Her updated medication list for this problem includes:    Atenolol 25 Mg Tabs (Atenolol) .Marland Kitchen... Take one by mouth once a day    Lisinopril 10 Mg Tabs (Lisinopril) .Marland Kitchen... Take 1 tablet by mouth once a day  Problem # 4:  TREMOR, ESSENTIAL (ICD-333.1) stable.   Complete Medication List: 1)  Atenolol 25 Mg Tabs (Atenolol) .... Take one by mouth once a day 2)  Lisinopril 10 Mg Tabs (Lisinopril) .... Take 1 tablet by mouth once a day 3)  Calcium Carbonate-vitamin D 600-400 Mg-unit Tabs (Calcium carbonate-vitamin d) .... Take 1 tablet by mouth once a day 4)  Tamoxifen Citrate 20 Mg Tabs (Tamoxifen citrate) .... Take 1 tablet by mouth once a day  Hypertension Assessment/Plan:      The patient's hypertensive risk group is category B: At least one risk factor (excluding diabetes) with no target organ damage.  Her calculated 10 year risk of coronary heart disease is 9 %.  Today's blood pressure is 120/82.  Her blood pressure goal is < 140/90.  Patient Instructions: 1)  Check with insurance for shingles vaccine coverage.  2)  Please schedule a follow-up appointment in 6 months HTN.   Current Allergies (reviewed today): ! SULFA  Last PAP:  DVE, no pap (01/24/2008 2:19:15 PM) PAP Result Date:  07/31/2009 PAP Result:  DVE no pap q 3 years because breast cancer PAP Next Due:  3 yr

## 2010-03-16 NOTE — Letter (Signed)
Summary: [facility]   Imported By: Lanelle Bal 03/26/2009 10:36:20  _____________________________________________________________________  External Attachment:    Type:   Image     Comment:   External Document

## 2010-03-18 NOTE — Assessment & Plan Note (Signed)
Summary: ROA FOR 6 MONTH FOLLOW-UP/JRR   Vital Signs:  Patient profile:   77 year old female Height:      67 inches Weight:      160.0 pounds BMI:     25.15 Temp:     98.2 degrees F oral Pulse rate:   64 / minute Pulse rhythm:   regular BP sitting:   120 / 70  (left arm) Cuff size:   regular  Vitals Entered By: Benny Lennert CMA Duncan Dull) (January 26, 2010 11:57 AM)  History of Present Illness:   77 year old female here for 6 month follow up appt.   Sees ONC for breast CA in situ on tamoxifen .. on for 5 years total..on year #2. Next follow up in Feb.   Callus improved on right foot.Marland Kitchen area on left great tow where we debrided has decreased in size but still protruding out.   Hypertension History:      She denies headache, chest pain, palpitations, dyspnea with exertion, peripheral edema, neurologic problems, and side effects from treatment.  Well controlled at home. Marland Kitchen        Positive major cardiovascular risk factors include female age 27 years old or older, hyperlipidemia, and hypertension.  Negative major cardiovascular risk factors include non-tobacco-user status.    Lipid Management History:      Positive NCEP/ATP III risk factors include female age 67 years old or older and hypertension.  Negative NCEP/ATP III risk factors include non-tobacco-user status.     Problems Prior to Update: 1)  Callus, Toe  (ICD-700) 2)  Preventive Health Care  (ICD-V70.0) 3)  Carcinoma in Situ of Breast  (ICD-233.0) 4)  Dermatitis, Hands  (ICD-692.9) 5)  Seborrheic Keratosis  (ICD-702.19) 6)  Syncope, Hx of  (ICD-V12.49) 7)  Vaginitis, Atrophic  (ICD-627.3) 8)  Hypercholesterolemia  (ICD-272.0) 9)  Tremor, Essential  (ICD-333.1) 10)  Bradycardia / Atenolol  (ICD-427.89) 11)  Osteopenia  (ICD-733.90) 12)  Hypertension  (ICD-401.9) 13)  Diverticulosis, Colon  (ICD-562.10)  Current Medications (verified): 1)  Atenolol 25 Mg  Tabs (Atenolol) .... Take One By Mouth Once A Day 2)   Lisinopril 10 Mg  Tabs (Lisinopril) .... Take 1 Tablet By Mouth Once A Day 3)  Calcium Carbonate-Vitamin D 600-400 Mg-Unit  Tabs (Calcium Carbonate-Vitamin D) .... Take 1 Tablet By Mouth Once A Day 4)  Tamoxifen Citrate 20 Mg Tabs (Tamoxifen Citrate) .... Take 1 Tablet By Mouth Once A Day  Allergies: 1)  ! Sulfa  Past History:  Past medical, surgical, family and social histories (including risk factors) reviewed, and no changes noted (except as noted below).  Past Medical History: Reviewed history from 05/12/2006 and no changes required. Diverticulosis, colon Hypertension Osteopenia  Past Surgical History: Reviewed history from 03/03/2009 and no changes required. DEXA T 1-5 2006 HEAD CT NEG 09/2005 CXR NEG 1994 ENROLLED IN CLIP PROGRAM [phone] left breast lumpectomy   Family History: Reviewed history from 06/20/2007 and no changes required. no family  hx of skin cancer  Social History: Reviewed history and no changes required.  Review of Systems General:  Denies chills, fatigue, and fever. CV:  Denies chest pain or discomfort. Resp:  Denies shortness of breath. GI:  Denies abdominal pain. GU:  Denies dysuria.  Physical Exam  General:  Well-developed,well-nourished,in no acute distress; alert,appropriate and cooperative throughout examination Mouth:  MMM Neck:  no carotid bruit or thyromegaly no cervical or supraclavicular lymphadenopathy  Lungs:  Normal respiratory effort, chest expands symmetrically. Lungs are clear  to auscultation, no crackles or wheezes. Heart:  Normal rate and regular rhythm. S1 and S2 normal without gallop, murmur, click, rub or other extra sounds. Pulses:  R and L posterior tibial pulses are full and equal bilaterally  Extremities:  no edmea Skin:  dorsal surface of B hands red, cracked, dry flacky multiple SK and skin tags.  LArge callus on right foot great toe over toe pad Psych:  Cognition and judgment appear intact. Alert and cooperative  with normal attention span and concentration. No apparent delusions, illusions, hallucinations   Impression & Recommendations:  Problem # 1:  HYPERTENSION (ICD-401.9) Assessment Unchanged Well controlled. Continue current medication.  Her updated medication list for this problem includes:    Atenolol 25 Mg Tabs (Atenolol) .Marland Kitchen... Take one by mouth once a day    Lisinopril 10 Mg Tabs (Lisinopril) .Marland Kitchen... Take 1 tablet by mouth once a day  Problem # 2:  CALLUS, TOE (ICD-700) Assessment: Deteriorated Large ge callus debrided with 11 blade scalpe after callus was cleaned with alcohol. No bleeding.   Problem # 3:  CARCINOMA IN SITU OF BREAST (ICD-233.0) Assessment: Unchanged Last note from ONC reviewed.   Complete Medication List: 1)  Atenolol 25 Mg Tabs (Atenolol) .... Take one by mouth once a day 2)  Lisinopril 10 Mg Tabs (Lisinopril) .... Take 1 tablet by mouth once a day 3)  Calcium Carbonate-vitamin D 600-400 Mg-unit Tabs (Calcium carbonate-vitamin d) .... Take 1 tablet by mouth once a day 4)  Tamoxifen Citrate 20 Mg Tabs (Tamoxifen citrate) .... Take 1 tablet by mouth once a day  Hypertension Assessment/Plan:      The patient's hypertensive risk group is category B: At least one risk factor (excluding diabetes) with no target organ damage.  Her calculated 10 year risk of coronary heart disease is 9 %.  Today's blood pressure is 120/70.  Her blood pressure goal is < 140/90.  Lipid Assessment/Plan:      Based on NCEP/ATP III, the patient's risk factor category is "2 or more risk factors and a calculated 10 year CAD risk of < 20%".  The patient's lipid goals are as follows: Total cholesterol goal is 200; LDL cholesterol goal is 130; HDL cholesterol goal is 40; Triglyceride goal is 150.     Patient Instructions: 1)  Please schedule a follow-up appointment in 6 months  CPX.  2)  Fasting labs prior 3)  BMP prior to visit, ICD-9: 401.1, 272.0 4)  Hepatic Panel prior to visit ICD-9:  5)   Lipid panel prior to visit ICD-9 :    Orders Added: 1)  Est. Patient Level IV [[address]]    Current Allergies (reviewed today): ! SULFA

## 2010-04-01 NOTE — Letter (Signed)
Summary: [facility]  [facility]   Imported By: Kassie Mends 03/24/2010 10:19:39  _____________________________________________________________________  External Attachment:    Type:   Image     Comment:   External Document

## 2010-04-26 ENCOUNTER — Encounter: Payer: Self-pay | Admitting: Family Medicine

## 2010-04-26 ENCOUNTER — Ambulatory Visit (INDEPENDENT_AMBULATORY_CARE_PROVIDER_SITE_OTHER): Payer: Medicare Other | Admitting: Family Medicine

## 2010-04-26 DIAGNOSIS — L821 Other seborrheic keratosis: Secondary | ICD-10-CM

## 2010-04-27 ENCOUNTER — Telehealth: Payer: Self-pay | Admitting: Family Medicine

## 2010-05-04 NOTE — Assessment & Plan Note (Signed)
Summary: check mole on face/rbh   Vital Signs:  Patient profile:   77 year old female Height:      67 inches Weight:      163 pounds BMI:     25.62 Temp:     98.5 degrees F oral Pulse rate:   72 / minute Pulse rhythm:   regular BP sitting:   130 / 80  (left arm) Cuff size:   regular  Vitals Entered By: Benny Lennert CMA Duncan Dull) (April 26, 2010 3:18 PM)  History of Present Illness: Chief complaint check mole on face  Mole on left lower jaw.. increasing in size. Doubled in size in last 2-3 months. Itchy. No bleeding. No pain.  Has multiple SKs. No personal histroy of melanoma or skin cancer.  Problems Prior to Update: 1)  Callus, Toe  (ICD-700) 2)  Preventive Health Care  (ICD-V70.0) 3)  Carcinoma in Situ of Breast  (ICD-233.0) 4)  Dermatitis, Hands  (ICD-692.9) 5)  Seborrheic Keratosis  (ICD-702.19) 6)  Syncope, Hx of  (ICD-V12.49) 7)  Vaginitis, Atrophic  (ICD-627.3) 8)  Hypercholesterolemia  (ICD-272.0) 9)  Tremor, Essential  (ICD-333.1) 10)  Bradycardia / Atenolol  (ICD-427.89) 11)  Osteopenia  (ICD-733.90) 12)  Hypertension  (ICD-401.9) 13)  Diverticulosis, Colon  (ICD-562.10)  Current Medications (verified): 1)  Atenolol 25 Mg  Tabs (Atenolol) .... Take One By Mouth Once A Day 2)  Lisinopril 10 Mg  Tabs (Lisinopril) .... Take 1 Tablet By Mouth Once A Day 3)  Calcium Carbonate-Vitamin D 600-400 Mg-Unit  Tabs (Calcium Carbonate-Vitamin D) .... Take 1 Tablet By Mouth Once A Day 4)  Tamoxifen Citrate 20 Mg Tabs (Tamoxifen Citrate) .... Take 1 Tablet By Mouth Once A Day  Allergies: 1)  ! Sulfa  Past History:  Past medical, surgical, family and social histories (including risk factors) reviewed, and no changes noted (except as noted below).  Past Medical History: Reviewed history from 05/12/2006 and no changes required. Diverticulosis, colon Hypertension Osteopenia  Past Surgical History: Reviewed history from 03/03/2009 and no changes required. DEXA T 1-5  2006 HEAD CT NEG 09/2005 CXR NEG 1994 ENROLLED IN CLIP PROGRAM [phone] left breast lumpectomy   Family History: Reviewed history from 06/20/2007 and no changes required. no family  hx of skin cancer  Social History: Reviewed history and no changes required.  Review of Systems General:  Denies fatigue and fever. CV:  Denies chest pain or discomfort. Resp:  Denies shortness of breath.  Physical Exam  General:  Well-developed,well-nourished,in no acute distress; alert,appropriate and cooperative throughout examination Mouth:  MMM Lungs:  Normal respiratory effort, chest expands symmetrically. Lungs are clear to auscultation, no crackles or wheezes. Heart:  Normal rate and regular rhythm. S1 and S2 normal without gallop, murmur, click, rub or other extra sounds. Skin:  irritated SK left jaw. No worrisome features   Impression & Recommendations:  Problem # 1:  SEBORRHEIC KERATOSIS (ICD-702.19) Assessment New Irritated SK.  Offered removal given location and irritation, but no worrisome characteristics.  pt not interested in rempoval unless absolutely necessary.  Complete Medication List: 1)  Atenolol 25 Mg Tabs (Atenolol) .... Take one by mouth once a day 2)  Lisinopril 10 Mg Tabs (Lisinopril) .... Take 1 tablet by mouth once a day 3)  Calcium Carbonate-vitamin D 600-400 Mg-unit Tabs (Calcium carbonate-vitamin d) .... Take 1 tablet by mouth once a day 4)  Tamoxifen Citrate 20 Mg Tabs (Tamoxifen citrate) .... Take 1 tablet by mouth once a day  Patient Instructions:  1)  Due for Annual Medicare Wellness in 07/2009 2)  Call f interested in procedure appt for removal of irritate sebborheic dermatosis.   Orders Added: 1)  Est. Patient Level III [40981]    Current Allergies (reviewed today): ! SULFA

## 2010-05-13 NOTE — Progress Notes (Signed)
  Phone Note Other Incoming   Caller: Husband and son Summary of Call: Husband, Jalei Shibley was in today and asked to talk to you about his wife . He has concerns about her memory and fills like he wants to talk to you without his wife knowing. He is taking a HIPPA release form home to fill out with his wife.  Initial call taken by: Mills Koller,  April 27, 2010 2:27 PM  Follow-up for Phone Call        Have him call to speak with me at lunch time after form signed and in chart. Follow-up by: Kerby Nora MD,  April 27, 2010 2:50 PM  Additional Follow-up for Phone Call Additional follow up Details #1::        Dr. Ermalene Searing this form was signed by patient in 07-2009 can you call him on friday to discuss concerns Additional Follow-up by: Benny Lennert CMA Duncan Dull),  April 28, 2010 4:48 PM    Additional Follow-up for Phone Call Additional follow up Details #2::    Called pt's home number to speak with husband.. he was not available. I could not leave a message given husband did not want pt to know that I was talking to him about this issue.  Heather: Please try back again later today.Marland KitchenMarland KitchenI don't know how we can contact him otherwise... we may have to let her know that he wanted to speak with [place] and ask her to have him call us back.  If you have a better idea, let me know.  Follow-up by: Kerby Nora MD,  May 03, 2010 10:40 AM  Additional Follow-up for Phone Call Additional follow up Details #3:: Details for Additional Follow-up Action Taken: Tried to call patient but, wife says that he will not be home until after 3 pm probably Benny Lennert CMA Duncan Dull),  May 03, 2010 11:39 AM  Can you make a note that we need to contact him tommorow on paper do it does not get lost in the Epic changeover? Thx. Additional Follow-up by: Kerby Nora MD,  May 03, 2010 4:53 PM    Note made.Consuello Masse CMA

## 2010-05-19 ENCOUNTER — Telehealth: Payer: Self-pay | Admitting: *Deleted

## 2010-05-19 NOTE — Telephone Encounter (Signed)
Pt's husband want you to be aware that pt is showing signs of dementia.  He said she is due for an office visit in July- nothing scheduled yet- and he would like you to check her for that.  I advised him that she would need a 30 minute appt, but he said she schedules her appts herself and wouldn't know to do that.  Asks if we can schedule her appt and call her with the date and time.

## 2010-05-19 NOTE — Telephone Encounter (Signed)
Please make her next appt 30 min.. Call to schedule.

## 2010-05-24 LAB — BASIC METABOLIC PANEL
CO2: 29 mEq/L (ref 19–32)
Chloride: 102 mEq/L (ref 96–112)
Creatinine, Ser: 0.52 mg/dL (ref 0.4–1.2)
GFR calc Af Amer: >60 mL/min (ref >60)

## 2010-05-24 NOTE — Telephone Encounter (Signed)
Patient advised Dr. Ermalene Searing would like her to come in for appt and appt made 06-29-2010 at 12;15

## 2010-06-22 ENCOUNTER — Encounter (INDEPENDENT_AMBULATORY_CARE_PROVIDER_SITE_OTHER): Payer: Self-pay | Admitting: Surgery

## 2010-06-25 ENCOUNTER — Encounter: Payer: Self-pay | Admitting: Family Medicine

## 2010-06-29 ENCOUNTER — Ambulatory Visit (INDEPENDENT_AMBULATORY_CARE_PROVIDER_SITE_OTHER): Payer: Medicare Other | Admitting: Family Medicine

## 2010-06-29 ENCOUNTER — Encounter: Payer: Self-pay | Admitting: Family Medicine

## 2010-06-29 VITALS — BP 130/80 | HR 72 | Temp 97.9°F | Ht 65.0 in | Wt 163.0 lb

## 2010-06-29 DIAGNOSIS — R413 Other amnesia: Secondary | ICD-10-CM

## 2010-06-29 NOTE — Letter (Signed)
August 02, 2007    Kerby Nora, MD  [address]  [place]  [place] [address]   RE:  OLUWADARA, [name] [name]  MRN:  [id number]  /  DOB:  22-Mar-1933   Dear Linton Rump,   Ms. Tuckerman returns to the office after admission to [facility]  in 2008 for a syncopal spell.  She has a history of hypertension, but  has otherwise been generally healthy.  With adjustment of her  antihypertensive regimen, she has done quite well.  She has had no  recurrent syncope, not even any lightheadedness.  Blood pressure has  been good when assessed at home, but somewhat suboptimal when evaluated  in the office.  She remains active without any other medical issues.   CURRENT MEDICATIONS:  1. Atenolol 25 mg daily.  2. Lisinopril 10 mg daily.  3. Vitamin D plus calcium.   PHYSICAL EXAMINATION:  GENERAL:  Very pleasant woman, in no acute  distress.  The weight is 139, 7 pounds less than at her last visit in  December 2008.  VITAL SIGNS:  Blood pressure 144/60, heart rate 75 and regular,  respirations 13.  SKIN:  Multiple keratoses and skin tags.  LUNGS:  Clear.  CARDIAC:  Normal first and second heart sounds; fourth heart sound  present.  EXTREMITIES:  No edema.  NEUROLOGIC:  Minimal tremor of head and hands.   IMPRESSION:  Ms. Bolanos is doing extremely well at the present time.  She  does not require additional Cardiology followup unless she has recurrent  syncope or other cardiac issues.   Thank you so much for allowing me to share into her care and please let  me know if I can be of further assistance.    Sincerely,      Gerrit Friends. Dietrich Pates, MD, [facility]  Electronically Signed    RMR/MedQ  DD: 08/02/2007  DT: 08/03/2007  Job #: [id number]

## 2010-06-29 NOTE — H&P (Signed)
NAMEDAMARA, KLUNDER NO.:  [id number]   MEDICAL RECORD NO.:  [id number]          PATIENT TYPE:  EMS   LOCATION:  MAJO                         FACILITY:  [facility]   PHYSICIAN:  Elmore Guise., M.D.DATE OF BIRTH:  October 15, 1933   DATE OF ADMISSION:  01/09/2007  DATE OF DISCHARGE:                              HISTORY & PHYSICAL   INDICATION FOR ADMISSION:  Syncope.   PRIMARY CARE PHYSICIAN:  [facility], [place].   HISTORY OF PRESENT ILLNESS:  The patient is a very pleasant 77 year old  white female with past medical history of hypertension and resting  tremor who presented after a syncopal episode.  The patient reports  normal state of health.  She is very active.  She was exercising daily  by walking more than a mile.  Today she was doing her normal activities.  She did her normal morning walk.  This afternoon she was out raking  leaves.  She denies any significant problems.  No recent fever or cough,  nausea, vomiting, diarrhea.  No exertional chest pain.  No palpitations.  No change in her diet.  This evening after a late snack, she was walking  to the table.  She felt acutely flushed and dizzy and then she passed  out.  Her husband reported that he heard her hit the floor, came to her  assistance and she was out for two to three minutes.  He denies any  seizure activity.  On awakening, she was groggy, but alert and oriented.  She was bleeding from the back of her head where she hit the door stop.  She was then brought to the emergency department for further evaluation.  Currently, she was resting comfortably without complaints.  She did have  staples put in the posterior portion with a laceration on her occipital  region which was currently without any further bleeding.  She was  hemodynamically stable and awaiting admission.   REVIEW OF SYSTEMS:  As per HPI.  All others are negative.  The patient  does report that she has been very active over  the last six years. She  used to weigh 225 pounds. She is now down to 145 and over this time just  by doing exercise and diet has been able to trim her weight down over 80  pounds.  She also reports a similar spell back in August 2007. At that  time it was thought to be secondary to bradycardia and she had her  atenolol decreased from 100 down to 50 mg daily.   CURRENT MEDICATIONS:  1. Atenolol 50 mg daily.  2. Enalapril/HCTZ 10/25 mg daily.   ALLERGIES:  SULFA.   FAMILY HISTORY:  Positive for heart disease in a grandparent and  hypertension with her father.   SOCIAL HISTORY:  She is married.  She has an approximately 45-year  history of tobacco and continues to smoke at least one pack per day.  She has occasional alcohol intake.   PHYSICAL EXAMINATION:  VITAL SIGNS:  Her temperature is 98.4, blood  pressure 154/50, heart rate 60-70, normal  sinus rhythm on telemetry  monitoring.  She is satting 97% on room air.  GENERAL:  She is a very pleasant, elderly white female, alert and  oriented except for in no acute distress.  HEENT: Normal.  NECK:  Supple.  No lymphadenopathy.  2+ carotids.  No JVD and no bruits.  LUNGS:  Clear.  HEART:  Regular with soft 2/6 systolic flow murmur noted.  ABDOMEN:  Soft, nontender, nondistended.  No rebound or guarding.  No  abdominal bruits.  EXTREMITIES:  Warm with 2+ pulses and no significant edema.  NEUROLOGIC:  Cranial nerves are intact.  She does have fine resting  tremor noted.  She also has a laceration noted in the occipital area  with staples in place.   LABORATORY DATA:  Her blood work showed myoglobin of 54.9 and 72 and MB  of less than 1 x2.  Troponin-I less than 0.05 x2.  Her coags are normal.  Potassium is 3.5, BUN 18, creatinine 0.7.  Hemoglobin is 13.6. CT scan  of the head and spine were showed no acute changes.  Chest x-ray showed  no acute cardiopulmonary disease.  An ECG showed sinus bradycardia, rate  of 59 per minute with no  significant ST-T wave changes   IMPRESSION:  1. Syncope.  2. History of hypertension.   PLAN:  1. The patient to be admitted to the telemetry bed for 24-hour      observation.  We will continue serial cardiac enzymes, check      orthostatic blood pressures and check an echo.  I plan to decrease      her atenolol down to 25 mg daily.  Further measures per her primary      team.      Elmore Guise., M.D.  Electronically Signed     TWK/MEDQ  D:  01/10/2007  T:  01/10/2007  Job:  [id number]

## 2010-06-29 NOTE — Assessment & Plan Note (Signed)
[facility]                       [facility] OFFICE NOTE   NAME:[name], [name] BOLINGER                        MRN:          [id number]  DATE:01/26/2007                            DOB:          03/16/1933    [name] [name] returns to the office for continued assessment and treatment  of syncope. She was recently admitted to [facility] where  initial evaluation was negative. Her anti-hypertensive regimen was  modified. She has had no subsequent symptoms.   CURRENT MEDICATIONS:  1. Atenolol 25 mg daily.  2. Lisinopril 10 mg daily.  3. Calcium plus vitamin D.   PHYSICAL EXAMINATION:  VITAL SIGNS:  Heart rate 60 and regular, blood  pressure 120/60 without orthostatic change, respirations 16, weight 146  pounds.  NECK:  No jugular venous distention; normal carotid upstrokes without  bruits.  LUNGS:  Clear.  CARDIAC:  Normal first and second heart sounds; fourth heart sound  present.  ABDOMEN:  Soft and nontender; no organomegaly; aortic pulsation not  palpable.  EXTREMITIES:  No edema; normal distal pulses.   ECHOCARDIOGRAM:  Slight hypertrophy at the base of the septum; otherwise  unremarkable.   IMPRESSION:  [name] [name] is doing well following hospital admission,  substitution of an ACE inhibitor for a diuretic, and a decrease in her  dose of beta blocker. Blood pressure control appears good at the present  time. I suspect that she will do well from here on out, but we will plan  to reassess this nice woman in six months.     Gerrit Friends. Dietrich Pates, MD, [facility]  Electronically Signed    RMR/MedQ  DD: 01/26/2007  DT: 01/27/2007  Job #: [id number]

## 2010-06-29 NOTE — Procedures (Signed)
[name], [name] [name]                 ACCOUNT NO.:  [id number]   MEDICAL RECORD NO.:  [id number]          PATIENT TYPE:  OUT   LOCATION:  RAD                           FACILITY:  [facility]   PHYSICIAN:  Gerrit Friends. Dietrich Pates, MD, FACCDATE OF BIRTH:  December 25, 1933   DATE OF PROCEDURE:  01/26/2007  DATE OF DISCHARGE:  01/26/2007                                ECHOCARDIOGRAM   CLINICAL DATA:  A 77 year old woman with chest pain and hypertension.   M-MODE:  Aorta 3.3, left atrium 4.0, septum 1.5, posterior wall 1.1, LV  diastole 4.1, LV systole 3.4.   1. Technically adequate echocardiographic study.  2. Left atrial size at the upper limit of normal; normal right atrium      and right ventricle.  3. Normal and trileaflet aortic valve.  4. Normal diameter of the proximal ascending aorta; mild calcification      of the wall and annulus.  5. Slight thickening of the mitral valve with trivial regurgitation.  6. Slight thickening of the tricuspid valve with mild regurgitation      and normal estimated RV systolic pressure.  7. Normal pulmonic valve and proximal pulmonary artery.  8. Normal left ventricular size; no hypertrophy except for minimal      thickening of the very basilar septum; normal regional and global      LV systolic function.  9. Minimal pericardial effusion.  10.Normal IVC.      Gerrit Friends. Dietrich Pates, MD, [facility]  Electronically Signed     RMR/MEDQ  D:  01/27/2007  T:  01/28/2007  Job:  [id number]

## 2010-06-29 NOTE — Discharge Summary (Signed)
NAMEJERUSALEN, [name] [name]                 ACCOUNT NO.:  [id number]   MEDICAL RECORD NO.:  [id number]          PATIENT TYPE:  INP   LOCATION:  6524                         FACILITY:  [facility]   PHYSICIAN:  Gerrit Friends. Dietrich Pates, MD, FACCDATE OF BIRTH:  02-18-1933   DATE OF ADMISSION:  01/09/2007  DATE OF DISCHARGE:  01/10/2007                               DISCHARGE SUMMARY   PRIMARY CARDIOLOGIST:  Gerrit Friends. Dietrich Pates, MD, [facility].   DISCHARGE DIAGNOSIS:  Syncope.   PAST MEDICAL HISTORY:  Includes hypertension.   HOSPITAL COURSE:  [name] [name] is a 77 year old Caucasian female who  presented after a syncopal episode.  [name] [name] is normally a very active  female who exercises daily.  Apparently on the evening prior to  admission, the patient was walking to the table to have an evening  snack, suddenly  felt flushed and dizzy and passed out.  Husband  reported that he heard her hit the floor, came to her assistance.  She  was out 2 to 3 minutes.  No seizure activity was noted.  On awakening  she was groggy but alert and oriented.  She was bleeding from her head  where she hit the door stop.  She was brought to the emergency room at  [facility] for further evaluation.  She was hemodynamically stable.  Blood pressure 154/50, sat 97% on room air in the emergency room.  Initial cardiac enzymes were negative.  Electrolytes within normal  limits.  Chest x-ray:  No acute findings.  EKG sinus brady at a rate of  59.  The patient was admitted for observation.  She underwent a CT of  the head that showed atrophy with minimal small vessel chronic ischemic  changes of deep cerebral white matter.  No acute findings.   LABORATORY WORK:  The patient ruled out for myocardial infarction.  TSH  3.565.  Chemistry within normal limits with a mildly elevated glucose of  114 and a sodium of 134.  Urinalysis was negative.  Adjustments were  made in the patient's medication.  She had no further syncopal episodes.  Dr.  Dietrich Pates to see the patient on the morning of discharge.  The  patient being sent home on atenolol.  The dose been decreased to 25 mg  daily.  Her HCTZ has been discontinued.  She is just to continue her  lisinopril 10 mg daily.  She is scheduled for followup appointment with  Dr. Dietrich Pates.  I have scheduled her for outpatient echocardiogram  December 12.  She is to present to the front desk at [facility]  at 10:00 a.m. for a 2-D echocardiogram and then she has a followup  appointment with Dr. Dietrich Pates at 11:30 that day.   Duration of discharge encounter less than 30 minutes.      Dorian Pod, ACNP      Gerrit Friends. Dietrich Pates, MD, [facility]  Electronically Signed   MB/MEDQ  D:  01/10/2007  T:  01/10/2007  Job:  [id number]

## 2010-06-29 NOTE — Op Note (Signed)
[name], [name] [name]                 ACCOUNT NO.:  [id number]   MEDICAL RECORD NO.:  [id number]          PATIENT TYPE:  AMB   LOCATION:  DSC                          FACILITY:  [facility]   PHYSICIAN:  Sandria Bales. Ezzard Standing, M.D.  DATE OF BIRTH:  1933/11/10   DATE OF PROCEDURE:  07/17/2008  DATE OF DISCHARGE:                               OPERATIVE REPORT   Date of Surgery ?   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   POSTOPERATIVE DIAGNOSIS:  Ductal carcinoma in situ of left breast at 2-3  o'clock position.   PROCEDURE:  Needle localization left breast lumpectomy.   SURGEON:  Sandria Bales. Ezzard Standing, MD   FIRST ASSISTANT:  None.   ANESTHESIA:  General endotracheal with approximately 30 mL of 0.25%  Marcaine.   COMPLICATIONS:  None.   INDICATIONS FOR PROCEDURE:  [name] is a 77 year old white female,  patient of Dr. Kerby Nora of [facility].  On a routine mammography,  she was found to have abnormal calcifications of her left breast and  underwent a biopsy on Jul 01, 2008, which showed ductal carcinoma in  situ.   I discussed with her the options for treatment and she has elected to  have a lumpectomy of her left breast.  I discussed with her the  indications, potential complications of lumpectomy.  Potential  complications include, but not limited bleeding, infection, nerve  injury, recurrence of her tumor, and inadequate excision.   OPERATIVE NOTE:  The [facility] with a wire  from her left breast that was coming out at the 9 o'clock position.  She  was taken to the operating room where she underwent a general  endotracheal anesthetic.  Her breast was prepped with ChloraPrep and  sterilely draped.   The wire came out fairly at the 3 o'clock position of her left breast  some, 8-10 cm from the areola.  I made a radial incision directly over  where I imagined this abnormality was.   I carried down the  incision all the way down the chest wall, excised a  block of breast tissue approximately 7 or 8 cm long about 4-5 cm wide.  I again went down to the chest wall, I did paint it with orientation  paint kit.   I then did a specimen mammogram, which confirmed the wire and a clip in  the middle of the specimen and this was sent for permanent pathology.   The wound was then irrigated.  The subcutaneous tissue was closed with 3-  0 Vicryl suture.  The skin closed with 5-0 Vicryl suture.  Dermabond  placed in the wound and then sterilely dressed and wrapped.  The patient  tolerated the procedure well, will be discharged home today, will be  back in touch with me in a week for followup.      Sandria Bales. Ezzard Standing, M.D.  Electronically Signed     DHN/MEDQ  D:  07/17/2008  T:  07/18/2008  Job:  [id number]   cc:  Kerby Nora, MD  Wardell Heath, MD

## 2010-07-05 DIAGNOSIS — F0280 Dementia in other diseases classified elsewhere without behavioral disturbance: Secondary | ICD-10-CM | POA: Insufficient documentation

## 2010-07-05 NOTE — Progress Notes (Signed)
Appt cancelled

## 2010-07-14 ENCOUNTER — Ambulatory Visit (INDEPENDENT_AMBULATORY_CARE_PROVIDER_SITE_OTHER): Payer: Medicare Other

## 2010-07-14 ENCOUNTER — Telehealth: Payer: Self-pay | Admitting: *Deleted

## 2010-07-14 ENCOUNTER — Inpatient Hospital Stay (INDEPENDENT_AMBULATORY_CARE_PROVIDER_SITE_OTHER)
Admission: RE | Admit: 2010-07-14 | Discharge: 2010-07-14 | Disposition: A | Discharge status: Home / Self Care | Payer: Medicare Other | Source: Ambulatory Visit | Attending: Emergency Medicine | Admitting: Emergency Medicine

## 2010-07-14 DIAGNOSIS — S52509A Unspecified fracture of the lower end of unspecified radius, initial encounter for closed fracture: Secondary | ICD-10-CM

## 2010-07-14 DIAGNOSIS — S52609A Unspecified fracture of lower end of unspecified ulna, initial encounter for closed fracture: Secondary | ICD-10-CM

## 2010-07-14 NOTE — Telephone Encounter (Signed)
Pt states she fell on concrete yesterday and injured her arm- painful and swollen.  She thinks she could have fractured it.  Per Drs. Copland and Hetty Ely, advised pt to go to ER or [facility] for treatment.  Pt was reluctant to do this, then said she has an ortho that she sees, might call that office.

## 2010-07-14 NOTE — Telephone Encounter (Signed)
Due to feeling might be broken, would have pt seen at ER for immediate diagnosis and immed treatment if broken.

## 2010-07-14 NOTE — Telephone Encounter (Signed)
Very reasonable. Due to volume, I cannot urgently eval today.

## 2010-08-10 ENCOUNTER — Encounter: Payer: Self-pay | Admitting: Family Medicine

## 2010-08-10 ENCOUNTER — Ambulatory Visit (INDEPENDENT_AMBULATORY_CARE_PROVIDER_SITE_OTHER): Payer: Medicare Other | Admitting: Family Medicine

## 2010-08-10 VITALS — BP 110/78 | HR 63 | Temp 98.0°F | Ht 65.0 in | Wt 152.0 lb

## 2010-08-10 DIAGNOSIS — S59909A Unspecified injury of unspecified elbow, initial encounter: Secondary | ICD-10-CM

## 2010-08-10 DIAGNOSIS — S6992XA Unspecified injury of left wrist, hand and finger(s), initial encounter: Secondary | ICD-10-CM

## 2010-08-10 DIAGNOSIS — R413 Other amnesia: Secondary | ICD-10-CM

## 2010-08-10 DIAGNOSIS — S62009A Unspecified fracture of navicular [scaphoid] bone of unspecified wrist, initial encounter for closed fracture: Secondary | ICD-10-CM

## 2010-08-10 NOTE — Assessment & Plan Note (Addendum)
MMSE 26-27/30 most issue is date and short term recall.  Pt does not have significant concerns. I have asked her to bring her husband for discussion if he has concerns in this area to next appt.  Will check lab eval to assess for secondary cause.  Her mother did have alzheimer's dz.  No suggestion of depression. Consider initiating aricept at next OV if labs nml.

## 2010-08-10 NOTE — Progress Notes (Signed)
Subjective:    Patient ID: [name] [name], female    DOB: [birth year], 77 y.o.   MRN: [id number]  HPI 77 year old female here for memory eval. She does not have significant concerns about memory.  She does admit some problems with key placement etc.   Husband voiced concerns over phone  regarding this issue. He did not want to let pt know he had concerns.  She does have questions about what she is supposed to do since the 5/30 injury to her left wrist. X-ray showed hairline fracture in scaphoid. Minimal pain,has been wearing left wrist brace constantly, icing.  She was supposed to follow up with Ortho closely, but per pt she never did. She states her daughter in law was supposed to take care of it but has been out of town.      Review of Systems  Constitutional: Negative for fever and fatigue.  HENT: Negative for ear pain.   Eyes: Negative for pain.  Respiratory: Negative for chest tightness and shortness of breath.   Cardiovascular: Negative for chest pain, palpitations and leg swelling.  Gastrointestinal: Negative for abdominal pain.  Genitourinary: Negative for dysuria.  Neurological: Positive for tremors. Negative for headaches.  Psychiatric/Behavioral: Negative for behavioral problems, dysphoric mood and agitation.       Objective:   Physical Exam  Constitutional: Vital signs are normal. She appears well-developed and well-nourished. She is cooperative.  Non-toxic appearance. She does not appear ill. No distress.  HENT:  Head: Normocephalic.  Right Ear: Hearing, tympanic membrane, external ear and ear canal normal. Tympanic membrane is not erythematous, not retracted and not bulging.  Left Ear: Hearing, tympanic membrane, external ear and ear canal normal. Tympanic membrane is not erythematous, not retracted and not bulging.  Nose: No mucosal edema or rhinorrhea. Right sinus exhibits no maxillary sinus tenderness and no frontal sinus tenderness. Left sinus exhibits no  maxillary sinus tenderness and no frontal sinus tenderness.  Mouth/Throat: Uvula is midline, oropharynx is clear and moist and mucous membranes are normal.  Eyes: Conjunctivae, EOM and lids are normal. Pupils are equal, round, and reactive to light. No foreign bodies found.  Neck: Trachea normal and normal range of motion. Neck supple. Carotid bruit is not present. No mass and no thyromegaly present.  Cardiovascular: Normal rate, regular rhythm, S1 normal, S2 normal, normal heart sounds, intact distal pulses and normal pulses.  Exam reveals no gallop and no friction rub.   No murmur heard. Pulmonary/Chest: Effort normal and breath sounds normal. Not tachypneic. No respiratory distress. She has no decreased breath sounds. She has no wheezes. She has no rhonchi. She has no rales.  Abdominal: Soft. Normal appearance and bowel sounds are normal. There is no tenderness.  Musculoskeletal:       Left wrist in brace, not examined in detail  Neurological: She is alert. She has normal strength and normal reflexes. She displays no atrophy and no tremor. No cranial nerve deficit or sensory deficit. She exhibits normal muscle tone. She displays a negative Romberg sign. Gait normal. GCS eye subscore is 4. GCS verbal subscore is 5. GCS motor subscore is 6.       Has benign tremor fairly severe  MMSE 26-27/30  Skin: Skin is warm, dry and intact. No rash noted.  Psychiatric: She has a normal mood and affect. Her speech is normal and behavior is normal. Judgment and thought content normal. Her mood appears not anxious. Cognition and memory are normal. She does not exhibit a  depressed mood.          Assessment & Plan:

## 2010-08-10 NOTE — Assessment & Plan Note (Signed)
Refer to Ortho for further eval/treat ASAP. Likely was supposed to follow up sooner.

## 2010-08-11 LAB — CBC WITH DIFFERENTIAL/PLATELET
Basophils Absolute: 0 10*3/uL (ref 0.0–0.1)
Eosinophils Absolute: 0.2 10*3/uL (ref 0.0–0.7)
HCT: 38.7 % (ref 36.0–46.0)
Hemoglobin: 13.1 g/dL (ref 12.0–15.0)
Lymphs Abs: 1.8 10*3/uL (ref 0.7–4.0)
MCHC: 33.7 g/dL (ref 30.0–36.0)
MCV: 99.1 fl (ref 78.0–100.0)
Monocytes Absolute: 0.3 10*3/uL (ref 0.1–1.0)
Neutro Abs: 5.6 10*3/uL (ref 1.4–7.7)
Platelets: 231 10*3/uL (ref 150.0–400.0)
RDW: 13 % (ref 11.5–14.6)

## 2010-08-11 LAB — TSH: TSH: 2.71 u[IU]/mL (ref 0.35–5.50)

## 2010-09-09 ENCOUNTER — Encounter: Payer: Self-pay | Admitting: Family Medicine

## 2010-09-09 ENCOUNTER — Ambulatory Visit (INDEPENDENT_AMBULATORY_CARE_PROVIDER_SITE_OTHER): Payer: Medicare Other | Admitting: Family Medicine

## 2010-09-09 VITALS — BP 120/72 | HR 85 | Temp 98.4°F | Ht 65.0 in | Wt 155.8 lb

## 2010-09-09 DIAGNOSIS — R413 Other amnesia: Secondary | ICD-10-CM

## 2010-09-09 MED ORDER — DONEPEZIL HCL 5 MG PO TABS: 5.0000 mg | ORAL_TABLET | Freq: Every evening | ORAL | Status: DC | PRN

## 2010-09-09 NOTE — Patient Instructions (Signed)
Start on aricept. Call if any side effects. Remember that no change in memory is what we are going for.

## 2010-09-09 NOTE — Assessment & Plan Note (Signed)
Discussed in detail with pt and husband.  Concerns for beginning changes of memory loss. Mother with Alzheimer's. Offered neuro referral or MRI brain.. Not clearly indicated and pt not interested in these at this time.   Will start aricept low dose. Rerpeat MMSE in 6 months.

## 2010-09-09 NOTE — Progress Notes (Signed)
  Subjective:    Patient ID: [name] [name], female    DOB: [birth year], 77 y.o.   MRN: [id number]  HPI  Seen last month for memory concerns.  Pt does not feel she has issues, husband is one concerned and is here today.  He has noted some gradual changes over the last year... More forgetful, now getting confused over adding multipling over bookkeeping, but she had done this for a long time.  Memory loss - [name] Nora, MD 08/10/2010 3:55 PM Addendum  MMSE 26-27/30 most issue is date and short term recall.  Pt does not have significant concerns.  I have asked her to bring her husband for discussion if he has concerns in this area to next appt.  Lab eval for secondary cause showed no anemia, nml B12, normal thyroid Her mother did have alzheimer's dz.  No suggestion of depression.  Consider initiating aricept at next OV if labs nml.      Review of Systems  Constitutional: Negative for fever and fatigue.  Respiratory: Negative for chest tightness and shortness of breath.   Cardiovascular: Negative for chest pain, palpitations and leg swelling.  Gastrointestinal: Negative for abdominal pain.  Genitourinary: Negative for dysuria.  Neurological: Positive for tremors. Negative for dizziness, seizures, syncope, facial asymmetry, weakness, light-headedness, numbness and headaches.  Psychiatric/Behavioral: Negative for behavioral problems, decreased concentration and agitation.       Objective:   Physical Exam  Constitutional: She is oriented to person, place, and time. She appears well-developed and well-nourished.  HENT:  Head: Normocephalic.  Neck: Normal range of motion. Neck supple.  Cardiovascular: Normal rate, regular rhythm, normal heart sounds and intact distal pulses.  Exam reveals no gallop and no friction rub.   No murmur heard. Pulmonary/Chest: Effort normal and breath sounds normal. No respiratory distress. She has no wheezes. She has no rales. She exhibits no tenderness.    Abdominal: Soft. Bowel sounds are normal. She exhibits no distension and no mass. There is no tenderness. There is no rebound and no guarding.  Neurological: She is alert and oriented to person, place, and time. She has normal strength and normal reflexes. No cranial nerve deficit. Coordination normal.  Psychiatric: She has a normal mood and affect. Her behavior is normal. Judgment normal. She exhibits abnormal recent memory.          Assessment & Plan:

## 2010-10-08 ENCOUNTER — Other Ambulatory Visit: Payer: Self-pay | Admitting: *Deleted

## 2010-10-08 MED ORDER — ATENOLOL 25 MG PO TABS: 25.0000 mg | ORAL_TABLET | Freq: Every day | ORAL | Status: DC

## 2010-10-25 ENCOUNTER — Encounter (INDEPENDENT_AMBULATORY_CARE_PROVIDER_SITE_OTHER): Payer: Self-pay | Admitting: Surgery

## 2010-11-09 ENCOUNTER — Encounter (HOSPITAL_BASED_OUTPATIENT_CLINIC_OR_DEPARTMENT_OTHER): Payer: Medicare Other | Admitting: Oncology

## 2010-11-09 ENCOUNTER — Other Ambulatory Visit: Payer: Self-pay | Admitting: Oncology

## 2010-11-09 DIAGNOSIS — Z923 Personal history of irradiation: Secondary | ICD-10-CM

## 2010-11-09 DIAGNOSIS — C50419 Malignant neoplasm of upper-outer quadrant of unspecified female breast: Secondary | ICD-10-CM

## 2010-11-09 DIAGNOSIS — D0590 Unspecified type of carcinoma in situ of unspecified breast: Secondary | ICD-10-CM

## 2010-11-09 DIAGNOSIS — M810 Age-related osteoporosis without current pathological fracture: Secondary | ICD-10-CM

## 2010-11-09 LAB — COMPREHENSIVE METABOLIC PANEL
AST: 15 U/L (ref 0–37)
Alkaline Phosphatase: 49 U/L (ref 39–117)
BUN: 12 mg/dL (ref 6–23)
Calcium: 9.2 mg/dL (ref 8.4–10.5)
Creatinine, Ser: 0.59 mg/dL (ref 0.50–1.10)

## 2010-11-09 LAB — CBC WITH DIFFERENTIAL/PLATELET
Basophils Absolute: 0.2 10*3/uL — ABNORMAL HIGH (ref 0.0–0.1)
EOS%: 1.1 % (ref 0.0–7.0)
HCT: 38.1 % (ref 34.8–46.6)
HGB: 13.2 g/dL (ref 11.6–15.9)
MCH: 33.4 pg (ref 25.1–34.0)
MCV: 96.7 fL (ref 79.5–101.0)
MONO%: 5.2 % (ref 0.0–14.0)
NEUT%: 73.8 % (ref 38.4–76.8)
Platelets: 233 10*3/uL (ref 145–400)

## 2010-11-23 ENCOUNTER — Encounter: Payer: Self-pay | Admitting: *Deleted

## 2010-11-23 LAB — POCT CARDIAC MARKERS
CKMB, poc: <1 — ABNORMAL LOW
Myoglobin, poc: 54.9
Myoglobin, poc: 72
Operator id: 284251
Troponin i, poc: <0.05

## 2010-11-23 LAB — I-STAT 8, (EC8 V) (CONVERTED LAB)
BUN: 18
Glucose, Bld: 130 — ABNORMAL HIGH
Hemoglobin: 13.6
Potassium: 3.5
Sodium: 135
pH, Ven: 7.4 — ABNORMAL HIGH

## 2010-11-23 LAB — CARDIAC PANEL(CRET KIN+CKTOT+MB+TROPI)
CK, MB: 2.8
Relative Index: INVALID
Total CK: 45
Troponin I: 0.01

## 2010-11-23 LAB — URINALYSIS, ROUTINE W REFLEX MICROSCOPIC
Bilirubin Urine: NEGATIVE
Hgb urine dipstick: NEGATIVE
Ketones, ur: NEGATIVE
Protein, ur: NEGATIVE
Urobilinogen, UA: 0.2

## 2010-11-23 LAB — CBC
MCV: 95.3
Platelets: 274
WBC: 11.6 — ABNORMAL HIGH

## 2010-11-23 LAB — BASIC METABOLIC PANEL
Chloride: 100
Creatinine, Ser: 0.55
GFR calc Af Amer: >60
Potassium: 4
Sodium: 134 — ABNORMAL LOW

## 2010-11-23 LAB — PROTIME-INR: INR: 1

## 2010-11-23 LAB — URINE MICROSCOPIC-ADD ON

## 2010-11-23 LAB — TSH: TSH: 3.565

## 2010-11-29 ENCOUNTER — Ambulatory Visit (INDEPENDENT_AMBULATORY_CARE_PROVIDER_SITE_OTHER): Payer: Medicare Other | Admitting: Family Medicine

## 2010-11-29 ENCOUNTER — Encounter: Payer: Self-pay | Admitting: Family Medicine

## 2010-11-29 VITALS — BP 124/70 | HR 71 | Temp 98.6°F | Ht 64.0 in | Wt 164.0 lb

## 2010-11-29 DIAGNOSIS — R350 Frequency of micturition: Secondary | ICD-10-CM

## 2010-11-29 DIAGNOSIS — N814 Uterovaginal prolapse, unspecified: Secondary | ICD-10-CM

## 2010-11-29 DIAGNOSIS — R32 Unspecified urinary incontinence: Secondary | ICD-10-CM | POA: Insufficient documentation

## 2010-11-29 LAB — POCT URINALYSIS DIPSTICK
Blood, UA: NEGATIVE
Glucose, UA: NEGATIVE
Spec Grav, UA: 1.015
Urobilinogen, UA: NEGATIVE

## 2010-11-29 NOTE — Assessment & Plan Note (Signed)
Primarily stress incontinence, but posible mild symptoms of urge incont. May consider med to treat.. Will have her discuss with GYn.

## 2010-11-29 NOTE — Patient Instructions (Signed)
At front desk for referral.. Speak with Shirlee Limerick.

## 2010-11-29 NOTE — Assessment & Plan Note (Signed)
Stage 1-2 uterine prolapse along with rectocele and cystocele. Will refer to GYN for ? Pessary vs surgical treatment.

## 2010-11-29 NOTE — Progress Notes (Signed)
  Subjective:    Patient ID: [name] [name], female    DOB: 04-May-1933, 77 y.o.   MRN: [id number]  HPI    77 year old female presents today to clinic with continues urinary incontinence ongoing for the last few months. She has urinary leakage when she stands up, walks. This is the symptom that bothers her the most. She does report some element of urgency and frequency. as well. She empties well. No dysuria, no vaginal or urinary blood.No abdominal pain. No fever.  She has not noted and protrusion of organ with straining.  No BM issues.  Has never tried medication to treat.   She has uterus and ovaries... No past hysterectomy.   On CPX in 2011 on GYN exam... Noted cystocele, and rectocele, mild vaginal prolapse.   Review of Systems  Constitutional: Negative for fever and fatigue.  Respiratory: Negative for shortness of breath.   Cardiovascular: Negative for chest pain.       Objective:   Physical Exam  Constitutional: She appears well-developed and well-nourished.  Cardiovascular: Normal rate, regular rhythm and normal heart sounds.  Exam reveals no gallop and no friction rub.   No murmur heard. Pulmonary/Chest: Effort normal and breath sounds normal.  Abdominal: Soft. Bowel sounds are normal. She exhibits no distension and no mass. There is no tenderness. There is no rebound and no guarding.  Genitourinary:       Stage 1-2 uterine prolapse... Cystocele and rectocele.          Assessment & Plan:

## 2010-11-30 ENCOUNTER — Other Ambulatory Visit: Payer: Self-pay | Admitting: Oncology

## 2010-11-30 DIAGNOSIS — D0590 Unspecified type of carcinoma in situ of unspecified breast: Secondary | ICD-10-CM

## 2010-12-20 ENCOUNTER — Other Ambulatory Visit (HOSPITAL_COMMUNITY): Payer: Self-pay | Admitting: Oncology

## 2010-12-20 ENCOUNTER — Telehealth: Payer: Self-pay | Admitting: *Deleted

## 2010-12-20 ENCOUNTER — Ambulatory Visit (HOSPITAL_BASED_OUTPATIENT_CLINIC_OR_DEPARTMENT_OTHER): Payer: Medicare Other | Admitting: Oncology

## 2010-12-20 ENCOUNTER — Other Ambulatory Visit (HOSPITAL_BASED_OUTPATIENT_CLINIC_OR_DEPARTMENT_OTHER): Payer: Medicare Other | Admitting: Lab

## 2010-12-20 VITALS — BP 148/72 | HR 73 | Temp 98.4°F | Ht 64.9 in | Wt 163.6 lb

## 2010-12-20 DIAGNOSIS — D0590 Unspecified type of carcinoma in situ of unspecified breast: Secondary | ICD-10-CM

## 2010-12-20 LAB — COMPREHENSIVE METABOLIC PANEL
CO2: 26 mEq/L (ref 19–32)
Creatinine, Ser: 0.59 mg/dL (ref 0.50–1.10)
Glucose, Bld: 126 mg/dL — ABNORMAL HIGH (ref 70–99)
Total Bilirubin: 0.2 mg/dL — ABNORMAL LOW (ref 0.3–1.2)

## 2010-12-20 LAB — CBC WITH DIFFERENTIAL/PLATELET
Basophils Absolute: 0.1 10*3/uL (ref 0.0–0.1)
Eosinophils Absolute: 0.1 10*3/uL (ref 0.0–0.5)
HGB: 13 g/dL (ref 11.6–15.9)
LYMPH%: 25.3 % (ref 14.0–49.7)
MCV: 96.1 fL (ref 79.5–101.0)
MONO%: 7.5 % (ref 0.0–14.0)
NEUT#: 4.3 10*3/uL (ref 1.5–6.5)
NEUT%: 64.4 % (ref 38.4–76.8)
Platelets: 229 10*3/uL (ref 145–400)
RBC: 4.03 10*6/uL (ref 3.70–5.45)

## 2010-12-20 LAB — LACTATE DEHYDROGENASE: LDH: 111 U/L (ref 94–250)

## 2010-12-20 NOTE — Telephone Encounter (Signed)
Gave patient appointment for [phone]

## 2010-12-20 NOTE — Progress Notes (Signed)
Note dictated

## 2010-12-21 NOTE — Progress Notes (Signed)
CC:   [name] [name]. Ezzard Standing, M.D. Kerby Nora, MD Maryln Gottron, M.D.  DIAGNOSIS:  A 77 year old female with ductal carcinoma in situ of the left breast status post lumpectomy in June 2010.  PAST THERAPY: 1. Status post left breast localized lumpectomy on July 17, 2008 with     the final pathology revealing an intermediate grade DCIS with     necrosis and microcalcifications measuring 1.0 cm ER positive 100%. 2. She then received radiation therapy to the left breast between September 11, 2008 to October 09, 2008. 3. Tamoxifen 20 mg since August 2010.  INTERVAL HISTORY:  The patient is seen in followup today.  Overall she seems to be doing quite well without any significant complaints.  She denies any nausea, vomiting, fevers, chills, night sweats, headaches. No shortness of breath.  No chest pains or palpitations.  No myalgias or arthralgias.  The remainder of the 10 point review of systems is negative.  PHYSICAL EXAMINATION:  The patient is awake and alert in no acute distress.  She appears well.  Vital signs:  Recorded separately in the EMR.  HEENT:  EOMI.  PERRLA.  Sclerae is anicteric.  No conjunctival pallor.  Oral mucosa is moist.  Neck:  Supple.  Chest:  Lungs are clear bilaterally to auscultation.  Cardiovascular:  Regular rate and rhythm. No murmurs, gallops or rubs.  Abdomen:  Soft, nontender, nondistended. Bowel sounds are present.  No HSM.  Extremities:  No edema.  Neurologic: Nonfocal.  LABORATORY DATA:  Hemoglobin is 13.0, hematocrit 38.7, white count is 6.7 and platelets are 229,000.  IMPRESSION AND PLAN:  A 77 year old female with ductal carcinoma in situ of the left breast status post lumpectomy followed by radiation now on tamoxifen which she is tolerating well overall.  The patient is on Fosamax for her osteoporosis.  I will continue to see her every 6 months.    ______________________________ Drue Second, M.D. KK/MEDQ  D:  12/20/2010  T:  12/21/2010  Job:   [id number]

## 2011-03-17 ENCOUNTER — Encounter: Payer: Self-pay | Admitting: Gastroenterology

## 2011-03-19 ENCOUNTER — Telehealth: Payer: Self-pay | Admitting: *Deleted

## 2011-03-19 NOTE — Telephone Encounter (Signed)
A message was left on voicemail to notify pt of future appts, 06/09/2010(lab@10am , Dr. Welton Flakes @1030 

## 2011-03-23 ENCOUNTER — Telehealth: Payer: Self-pay | Admitting: Gastroenterology

## 2011-03-23 NOTE — Telephone Encounter (Signed)
Received our Recall Colon Letter in the mail.

## 2011-03-28 ENCOUNTER — Other Ambulatory Visit: Payer: Self-pay

## 2011-03-28 MED ORDER — LISINOPRIL 10 MG PO TABS: 10.0000 mg | ORAL_TABLET | Freq: Every day | ORAL | Status: DC

## 2011-03-28 NOTE — Telephone Encounter (Signed)
[facility] faxed refill request lisinopril 10 mg #30 x 11.

## 2011-04-20 DIAGNOSIS — H52209 Unspecified astigmatism, unspecified eye: Secondary | ICD-10-CM | POA: Diagnosis not present

## 2011-04-20 DIAGNOSIS — Z961 Presence of intraocular lens: Secondary | ICD-10-CM | POA: Diagnosis not present

## 2011-05-12 ENCOUNTER — Telehealth: Payer: Self-pay

## 2011-05-12 NOTE — Telephone Encounter (Signed)
Noted. Thanks.

## 2011-05-12 NOTE — Telephone Encounter (Signed)
Pt walked in.Started sore or callus on bottom of left big toe and developed blister size of dime on side of toe which burst 2 days ago.Pt said left toe hurts from base of toenail to where toe joins foot with some redness and warm to touch.Pt said dull pain (pain level 2) on lt big toe and last night toe drained pus. No fever and no hx of diabetes. No available appts. Dr Sharen Hones suggested pt go to UC. Pt said she would go to [facility] now. Redressed toe with telfa and gauze pad.

## 2011-05-13 ENCOUNTER — Encounter (HOSPITAL_COMMUNITY): Payer: Self-pay

## 2011-05-13 ENCOUNTER — Emergency Department (INDEPENDENT_AMBULATORY_CARE_PROVIDER_SITE_OTHER): Payer: Medicare Other

## 2011-05-13 ENCOUNTER — Emergency Department (INDEPENDENT_AMBULATORY_CARE_PROVIDER_SITE_OTHER)
Admission: EM | Admit: 2011-05-13 | Discharge: 2011-05-13 | Disposition: A | Discharge status: Home / Self Care | Payer: Medicare Other | Source: Home / Self Care | Attending: Emergency Medicine | Admitting: Emergency Medicine

## 2011-05-13 DIAGNOSIS — L089 Local infection of the skin and subcutaneous tissue, unspecified: Secondary | ICD-10-CM

## 2011-05-13 DIAGNOSIS — L97509 Non-pressure chronic ulcer of other part of unspecified foot with unspecified severity: Secondary | ICD-10-CM | POA: Diagnosis not present

## 2011-05-13 DIAGNOSIS — G609 Hereditary and idiopathic neuropathy, unspecified: Secondary | ICD-10-CM

## 2011-05-13 DIAGNOSIS — G629 Polyneuropathy, unspecified: Secondary | ICD-10-CM

## 2011-05-13 LAB — GLUCOSE, CAPILLARY: Glucose-Capillary: 89 mg/dL (ref 70–99)

## 2011-05-13 MED ORDER — CEPHALEXIN 500 MG PO CAPS: 500.0000 mg | ORAL_CAPSULE | Freq: Three times a day (TID) | ORAL | Status: AC

## 2011-05-13 MED ORDER — CIPROFLOXACIN HCL 500 MG PO TABS: 250.0000 mg | ORAL_TABLET | Freq: Two times a day (BID) | ORAL | Status: AC

## 2011-05-13 NOTE — ED Notes (Signed)
Pt states that left toes is giving her problems. Bandage removed on left toe and slight drainage noted on gauge. Denies pain and no pain when walking. Upon examination pt states she can feel sensation. Left toe looks blistered on the right side of left toes with a hard callulus on the bottom. Left foot slightly swollen and has an odor.

## 2011-05-13 NOTE — ED Provider Notes (Addendum)
History     CSN: [id number]  Arrival date & time 05/13/11  0840   First MD Initiated Contact with Patient 05/13/11 [phone]      Chief Complaint  Patient presents with  . Toe Injury    (Consider location/radiation/quality/duration/timing/severity/associated sxs/prior treatment) HPI Comments: Patient presents today to urgent care and she's been having a discrete drainage with marked odor from her left great toe. She has noticed this for the last 3 days." It looked like a blister on my left toe" on the right side of my toe and have a callus on the bottom of my left great toe. Told friend and she brought me in today to be checked" "I have no pain and seems like him not feeling anything from my toe"  Unaware of I have diabetes or any other problems such as "nerve issues".  Patient denied any recent injury or trauma. No fevers  The history is provided by the patient.    Past Medical History  Diagnosis Date  . Diverticulosis   . HTN (hypertension)   . Osteopenia   . Breast cancer   . Hyperlipidemia     Past Surgical History  Procedure Date  . Breast lumpectomy   . Right cataract extraction     Family History  Problem Relation Age of Onset  . Cancer Brother     Prostate    History  Substance Use Topics  . Smoking status: Never Smoker   . Smokeless tobacco: Not on file  . Alcohol Use: No    OB History    Grav Para Term Preterm Abortions TAB SAB Ect Mult Living                  Review of Systems  Constitutional: Negative for fever, chills and fatigue.  Cardiovascular: Negative for leg swelling.  Skin: Positive for wound.    Allergies  Sulfonamide derivatives and Sulfur  Home Medications   Current Outpatient Rx  Name Route Sig Dispense Refill  . ATENOLOL 25 MG PO TABS Oral Take 1 tablet (25 mg total) by mouth daily. 30 tablet 11  . CALCIUM CARBONATE-VITAMIN D 600-200 MG-UNIT PO TABS Oral Take 2 tablets by mouth.      Marland Kitchen LISINOPRIL 10 MG PO TABS Oral Take 1  tablet (10 mg total) by mouth daily. 30 tablet 11  . TAMOXIFEN CITRATE 20 MG PO TABS Oral Take 20 mg by mouth.     . CEPHALEXIN 500 MG PO CAPS Oral Take 1 capsule (500 mg total) by mouth 3 (three) times daily. 15 capsule 0  . CIPROFLOXACIN HCL 500 MG PO TABS Oral Take 0.5 tablets (250 mg total) by mouth 2 (two) times daily. 20 tablet 0  . DONEPEZIL HCL 5 MG PO TABS Oral Take 1 tablet (5 mg total) by mouth at bedtime as needed. 30 tablet 11  . SIMVASTATIN 40 MG PO TABS Oral Take 40 mg by mouth daily.       BP 151/57  Pulse 76  Temp(Src) 97.8 F (36.6 C) (Oral)  Resp 16  Ht 5\' 5"  (1.651 m)  Wt 162 lb (73.483 kg)  BMI 26.96 kg/m2  SpO2 100%  Physical Exam  Nursing note reviewed. Constitutional: She appears well-developed and well-nourished.  Musculoskeletal:       Feet:  Neurological: She is alert.  Skin: Skin is warm.    ED Course  Procedures (including critical care time)   Labs Reviewed  GLUCOSE, CAPILLARY  WOUND CULTURE   Dg  Toe Great Left  05/13/2011  *RADIOLOGY REPORT*  Clinical Data: Ulcer on the medial aspect of the great toe.  LEFT GREAT TOE  Comparison: None.  Findings: Skin ulceration is seen.  No soft tissue gas collection is present.  There is no bony destructive change.  No fracture or dislocation.  IMPRESSION: Skin ulceration without underlying evidence of osteomyelitis.  Original Report Authenticated By: Bernadene Bell. D'ALESSIO, M.D.     1. Toe infection   2. Peripheral neuropathy       MDM  Suspect patient has been having some peripheral sensorial deficits and has developed a callus formation on her left toe which subsequently got infected. Have today provided antibiotic coverage to include Pseudomonas and 2 cultures available. Cleaned affected area and debrided necrotic epidermis and residual material. Observe signs of chronicity and granuloma formation with a marked odorous discharge( was cultured). Time frame patient and patient's companion as described  and as of 3 days with skin changes patient denied having felt any discomfort pain swelling or changes prior to that somewhat but callus formation. On neuro sensorial exam exam noted that she has significant compromised proprioception and two point discrimination. mainly in the dorsal aspect of her left foot. Patient was instructed to followup with primary care Dr. for further evaluation and look for etiologies or causes of this peripheral neurological deficit. Capillary refill seemed to be under 2 seconds        Jimmie Molly, MD 05/13/11 1138  Jimmie Molly, MD 05/13/11 [phone]

## 2011-05-13 NOTE — Discharge Instructions (Signed)
Please followup with her primary care doctor early next week further evaluation needs to occur as you seem to be having some important sensorial deficit that could have been the cause for this current infection. Start with both antibiotics we will contact you if there is need to modify his current regimen. Try to keep open while at indoors. Will encourage her to also see a foot doctor to help you fully resolve this localized callus formation.   Neuropathy Neuropathy means your peripheral nerves are not working normally. Peripheral nerves are the nerves outside the brain and spinal cord. Messages between the brain and the rest of the body do not work properly with peripheral nerve disorders. CAUSES There are many different causes of peripheral nerve disorders. These include:  Injury.   Infections.   Diabetes.   Vitamin deficiency.   Poor circulation.   Alcoholism.   Exposure to toxins.   Drug effects.   Tumors.   Kidney disease.  SYMPTOMS  Tingling, burning, pain, and numbness in the extremities.   Weakness and loss of muscle tone and size.  DIAGNOSIS Blood tests and special studies of nerve function may help confirm the diagnosis.  TREATMENT  Treatment includes adopting healthy life habits.   A good diet, vitamin supplements, and mild pain medicine may be needed.   Avoid known toxins such as alcohol, tobacco, and recreational drugs.   Anti-convulsant medicines are helpful in some types of neuropathy.  Make a follow-up appointment with your caregiver to be sure you are getting better with treatment.  SEEK IMMEDIATE MEDICAL CARE IF:   You have breathing problems.   You have severe or uncontrolled pain.   You notice extreme weakness or you feel faint.   You are not better after 1 week or if you have worse symptoms.  Document Released: 03/10/2004 Document Revised: 10/13/2010 Document Reviewed: 01/31/2005 [facility] Patient Information 2012 [place], [place].Peripheral  Nerve Problems Peripheral nerve disorders are problems with the nerves in your arms or legs. CAUSES  There are many different causes of these disorders. They include:  Injury.   Diabetes.   Chronic alcoholism.   Toxic chemicals and drugs.   Vitamin deficiencies.   Tumors.   Liver or kidney diseases.  SYMPTOMS  Some of the problems caused are:  Tingling, burning, pain, and numbness in the extremities and feet.   Weakness, loss of muscle tone, and size.  DIAGNOSIS  Sometimes blood tests and studies to examine nerve function are needed to make the diagnosis.  TREATMENT  Sometimes peripheral nerve problems can be treated with vitamins, medication, and avoiding known toxins such as alcohol. Please make a follow-up appointment to be sure you are getting better with treatment.  SEEK MEDICAL CARE IF:   You are not better after one week of treatment.   You have worsening of problems or have breathing trouble.  Document Released: 03/10/2004 Document Revised: 01/20/2011 Document Reviewed: 01/31/2005 [facility] Patient Information 2012 [place], [place].

## 2011-05-16 ENCOUNTER — Encounter: Payer: Self-pay | Admitting: Family Medicine

## 2011-05-16 ENCOUNTER — Ambulatory Visit (INDEPENDENT_AMBULATORY_CARE_PROVIDER_SITE_OTHER): Payer: Medicare Other | Admitting: Family Medicine

## 2011-05-16 VITALS — BP 140/80 | HR 63 | Temp 97.9°F | Ht 65.0 in | Wt 163.4 lb

## 2011-05-16 DIAGNOSIS — L97509 Non-pressure chronic ulcer of other part of unspecified foot with unspecified severity: Secondary | ICD-10-CM | POA: Diagnosis not present

## 2011-05-16 DIAGNOSIS — L03039 Cellulitis of unspecified toe: Secondary | ICD-10-CM | POA: Diagnosis not present

## 2011-05-16 DIAGNOSIS — L84 Corns and callosities: Secondary | ICD-10-CM

## 2011-05-16 DIAGNOSIS — L02619 Cutaneous abscess of unspecified foot: Secondary | ICD-10-CM

## 2011-05-16 DIAGNOSIS — G629 Polyneuropathy, unspecified: Secondary | ICD-10-CM

## 2011-05-16 DIAGNOSIS — G609 Hereditary and idiopathic neuropathy, unspecified: Secondary | ICD-10-CM

## 2011-05-16 LAB — BASIC METABOLIC PANEL
GFR: 116.82 mL/min (ref >60.00)
Potassium: 4.5 mEq/L (ref 3.5–5.1)
Sodium: 138 mEq/L (ref 135–145)

## 2011-05-16 LAB — TSH: TSH: 2.33 u[IU]/mL (ref 0.35–5.50)

## 2011-05-16 LAB — VITAMIN B12: Vitamin B-12: 300 pg/mL (ref 211–911)

## 2011-05-16 MED ORDER — UREA 40 % EX CREA: TOPICAL_CREAM | CUTANEOUS | Status: DC

## 2011-05-16 NOTE — Progress Notes (Signed)
Patient Name: [name] [name] Date of Birth: 1933-03-04 Age: 78 y.o. Medical Record Number: [id number] Gender: female Date of Encounter: 05/16/2011  History of Present Illness:  [name] [name] is a 78 y.o. very pleasant female patient who presents with the following:  F/u toe, seen at [facility] on 05/13/2011 with concerns over her left toe. She has been having some difficulty with decreased sensation in her toe, did not notice any particular problem, until the time of her toe becoming troubling. She is not really having any pain there. Her family noticed the drainage and black coloration, so took her in for evaluation. At that point she had some of this black tissue debridement, and she has been placed on Cipro and Keflex. She is done well, and is improved compared to her initial evaluation per her report.  Previously, the patient does not have a known history of peripheral neuropathy. She does have some mild dementia. She has no known history of diabetes.  Patient Active Problem List  Diagnoses  . CARCINOMA IN SITU OF BREAST  . HYPERCHOLESTEROLEMIA  . TREMOR, ESSENTIAL  . HYPERTENSION  . DIVERTICULOSIS, COLON  . VAGINITIS, ATROPHIC  . DERMATITIS, HANDS  . CALLUS, TOE  . SEBORRHEIC KERATOSIS  . OSTEOPENIA  . SYNCOPE, HX OF  . Dementia, mild  . Scaphoid fracture of wrist  . Uterine prolapse  . Urinary incontinence  . Breast cancer in situ  . Peripheral neuropathy   Past Medical History  Diagnosis Date  . Diverticulosis   . HTN (hypertension)   . Osteopenia   . Breast cancer   . Hyperlipidemia    Past Surgical History  Procedure Date  . Breast lumpectomy   . Right cataract extraction    History  Substance Use Topics  . Smoking status: Never Smoker   . Smokeless tobacco: Not on file  . Alcohol Use: No   Family History  Problem Relation Age of Onset  . Cancer Brother     Prostate   Allergies  Allergen Reactions  . Sulfonamide Derivatives     REACTION: rash  . Sulfur       hives   Current Outpatient Prescriptions on File Prior to Visit  Medication Sig Dispense Refill  . atenolol (TENORMIN) 25 MG tablet Take 1 tablet (25 mg total) by mouth daily.  30 tablet  11  . Calcium Carbonate-Vitamin D (CALCIUM 600+D) 600-200 MG-UNIT TABS Take 2 tablets by mouth.        . cephALEXin (KEFLEX) 500 MG capsule Take 1 capsule (500 mg total) by mouth 3 (three) times daily.  15 capsule  0  . ciprofloxacin (CIPRO) 500 MG tablet Take 0.5 tablets (250 mg total) by mouth 2 (two) times daily.  20 tablet  0  . donepezil (ARICEPT) 5 MG tablet Take 1 tablet (5 mg total) by mouth at bedtime as needed.  30 tablet  11  . lisinopril (PRINIVIL,ZESTRIL) 10 MG tablet Take 1 tablet (10 mg total) by mouth daily.  30 tablet  11  . simvastatin (ZOCOR) 40 MG tablet Take 40 mg by mouth daily.       . tamoxifen (NOLVADEX) 20 MG tablet Take 20 mg by mouth.          Past Medical History, Surgical History, Social History, Family History, Problem List, Medications, and Allergies have been reviewed and updated if relevant.  Review of Systems: As above. Afebrile. No chills or sweats. Eating and drinking normally. No chest pain. No shortness of breath. No  nausea. No falls or recent trauma  Physical Examination: Filed Vitals:   05/16/11 1013  BP: 140/80  Pulse: 63  Temp: 97.9 F (36.6 C)  TempSrc: Oral  Height: 5\' 5"  (1.651 m)  Weight: 163 lb 6.4 oz (74.118 kg)  SpO2: 99%    Body mass index is 27.19 kg/(m^2).   GEN: WDWN, NAD, Non-toxic, Alert & Oriented x 3 HEENT: Atraumatic, Normocephalic.  Ears and Nose: No external deformity. EXTR: No clubbing/cyanosis/edema, there does appear to be some decreased pulses in the dorsalis pedis compared to the RIGHT, and PT is palpable but faint NEURO: decreased sensation, distal LEFT foot Skin: Well demarcated ulcer without surrounding erythema, redness, fluctuance. Skin with area of prior debridement. Thickened tissue also on the dorsum of the toe with  quite thick skin. PSYCH: Normally interactive. Conversant. Not depressed or anxious appearing.  Calm demeanor.    Assessment and Plan: 1. Peripheral neuropathy  TSH, Vitamin B12, Basic metabolic panel, Vitamin B1, Ankle brachial index, Lower Extremity Arterial Duplex Bilateral  2. Skin ulcer of great toe    3. Cellulitis, toe    4. Corn or callus      Ulcer and infection appears to be overall grossly improving per her report.  Initiate peripheral neuropathy workup for potential reversible medical causes. I discussed with the patient and her family, that sometimes there is no identifiable cause, and this is idiopathic. She is due to followup with her primary care doctor in 4-5 weeks for a recheck and discussion of this and further after our initial workup.  A small amount of corn was debrided down to viable tissue with a #15 blade. No complications. When ulcer heals, they will start with urea cream daily to the area of callus.

## 2011-05-17 ENCOUNTER — Encounter (INDEPENDENT_AMBULATORY_CARE_PROVIDER_SITE_OTHER): Payer: Medicare Other

## 2011-05-17 DIAGNOSIS — I739 Peripheral vascular disease, unspecified: Secondary | ICD-10-CM | POA: Diagnosis not present

## 2011-05-17 DIAGNOSIS — G629 Polyneuropathy, unspecified: Secondary | ICD-10-CM

## 2011-05-17 DIAGNOSIS — E1140 Type 2 diabetes mellitus with diabetic neuropathy, unspecified: Secondary | ICD-10-CM | POA: Insufficient documentation

## 2011-05-17 DIAGNOSIS — L98499 Non-pressure chronic ulcer of skin of other sites with unspecified severity: Secondary | ICD-10-CM

## 2011-05-18 LAB — VITAMIN B1: Vitamin B1 (Thiamine): <7 nmol/L — ABNORMAL LOW (ref 8–30)

## 2011-06-09 ENCOUNTER — Encounter: Payer: Self-pay | Admitting: Oncology

## 2011-06-09 ENCOUNTER — Other Ambulatory Visit (HOSPITAL_BASED_OUTPATIENT_CLINIC_OR_DEPARTMENT_OTHER): Payer: Medicare Other | Admitting: Lab

## 2011-06-09 ENCOUNTER — Telehealth: Payer: Self-pay | Admitting: Oncology

## 2011-06-09 ENCOUNTER — Ambulatory Visit (HOSPITAL_BASED_OUTPATIENT_CLINIC_OR_DEPARTMENT_OTHER): Payer: Medicare Other | Admitting: Oncology

## 2011-06-09 VITALS — BP 160/83 | HR 81 | Temp 98.3°F | Ht 65.0 in | Wt 165.0 lb

## 2011-06-09 DIAGNOSIS — M810 Age-related osteoporosis without current pathological fracture: Secondary | ICD-10-CM

## 2011-06-09 DIAGNOSIS — I10 Essential (primary) hypertension: Secondary | ICD-10-CM | POA: Diagnosis not present

## 2011-06-09 DIAGNOSIS — D0590 Unspecified type of carcinoma in situ of unspecified breast: Secondary | ICD-10-CM

## 2011-06-09 DIAGNOSIS — Z170 Estrogen receptor positive status [ER+]: Secondary | ICD-10-CM

## 2011-06-09 DIAGNOSIS — Z853 Personal history of malignant neoplasm of breast: Secondary | ICD-10-CM | POA: Diagnosis not present

## 2011-06-09 LAB — CBC WITH DIFFERENTIAL/PLATELET
Basophils Absolute: 0.1 10*3/uL (ref 0.0–0.1)
EOS%: 1.8 % (ref 0.0–7.0)
Eosinophils Absolute: 0.1 10*3/uL (ref 0.0–0.5)
HGB: 12.2 g/dL (ref 11.6–15.9)
MCH: 32.8 pg (ref 25.1–34.0)
MONO#: 0.5 10*3/uL (ref 0.1–0.9)
NEUT#: 4.5 10*3/uL (ref 1.5–6.5)
RDW: 12.9 % (ref 11.2–14.5)
WBC: 6.7 10*3/uL (ref 3.9–10.3)
lymph#: 1.6 10*3/uL (ref 0.9–3.3)

## 2011-06-09 LAB — COMPREHENSIVE METABOLIC PANEL
Albumin: 3.8 g/dL (ref 3.5–5.2)
Alkaline Phosphatase: 51 U/L (ref 39–117)
Chloride: 104 mEq/L (ref 96–112)
Glucose, Bld: 99 mg/dL (ref 70–99)
Potassium: 4.2 mEq/L (ref 3.5–5.3)
Sodium: 140 mEq/L (ref 135–145)
Total Protein: 6.1 g/dL (ref 6.0–8.3)

## 2011-06-09 NOTE — Progress Notes (Signed)
OFFICE PROGRESS NOTE  CC  Kerby Nora, MD, MD [address] [place] [address] Dr. Ovidio Kin Dr. 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 Tumor was ER +100%.  #2 patient underwent radiation therapy to the left breast from 09/11/2008 to 10/09/2008.  #3 she was then begun on tamoxifen 20 mg starting in August 2010.  CURRENT THERAPY:Tamoxifen 20 mg daily since August 2010.  INTERVAL HISTORY: [name] [name] 78 y.o. female returns for Followup visit. She is accompanied by her husband as well as her daughter-in-law. She apparently had an infected big toe that was treated with antibiotics and is significantly better. She also has been non-compliant with her tamoxifen and many of her other medications including Aricept dose given to her for memory. She herself feels well no fevers chills night sweats headaches she has no shortness of breath no chest pains no palpitations no myalgias or arthralgias no vaginal bleeding. No hot flashes remainder of the 10 point review of systems is negative.  MEDICAL HISTORY: Past Medical History  Diagnosis Date  . Diverticulosis   . HTN (hypertension)   . Osteopenia   . Breast cancer   . Hyperlipidemia     ALLERGIES:  is allergic to sulfonamide derivatives and sulfur.  MEDICATIONS:  Current Outpatient Prescriptions  Medication Sig Dispense Refill  . atenolol (TENORMIN) 25 MG tablet Take 1 tablet (25 mg total) by mouth daily.  30 tablet  11  . Calcium Carbonate-Vitamin D (CALCIUM 600+D) 600-200 MG-UNIT TABS Take 2 tablets by mouth.        . donepezil (ARICEPT) 5 MG tablet Take 1 tablet (5 mg total) by mouth at bedtime as needed.  30 tablet  11  . lisinopril  (PRINIVIL,ZESTRIL) 10 MG tablet Take 1 tablet (10 mg total) by mouth daily.  30 tablet  11  . simvastatin (ZOCOR) 40 MG tablet Take 40 mg by mouth daily.       . tamoxifen (NOLVADEX) 20 MG tablet Take 20 mg by mouth.       . urea (CARMOL) 40 % CREA Apply to toe callus daily (disp 198.6 gram jar)  1 each  1    SURGICAL HISTORY:  Past Surgical History  Procedure Date  . Breast lumpectomy   . Right cataract extraction     REVIEW OF SYSTEMS:  Pertinent items are noted in HPI.   PHYSICAL EXAMINATION: General appearance: alert, cooperative and appears stated age Neck: no adenopathy, no carotid bruit, no JVD, supple, symmetrical, trachea midline and thyroid not enlarged, symmetric, no tenderness/mass/nodules Lymph nodes: Cervical, supraclavicular, and axillary nodes normal. Resp: clear to auscultation bilaterally Back: symmetric, no curvature. ROM normal. No CVA tenderness. Cardio: regular rate and rhythm, S1, S2 normal, no murmur, click, rub or gallop GI: soft, non-tender; bowel sounds normal; no masses,  no organomegaly Extremities: extremities normal, atraumatic, no cyanosis or edema Neurologic: Grossly normal Bilateral breast examination right breast no masses nipple discharge or skin changes. Left breast reveals well-healed incisional scar no masses nipple discharge. ECOG PERFORMANCE STATUS: 1 - Symptomatic but completely ambulatory  Blood pressure 160/83, pulse 81, temperature 98.3 F (36.8 C), temperature source Oral, height 5\' 5"  (1.651 m), weight 165 lb (74.844 kg).  LABORATORY DATA: Lab Results  Component  Value Date   WBC 6.7 06/09/2011   HGB 12.2 06/09/2011   HCT 35.8 06/09/2011   MCV 96.5 06/09/2011   PLT 301 06/09/2011      Chemistry      Component Value Date/Time   NA 138 05/16/2011 1121   NA 141 12/16/2009 1052   K 4.5 05/16/2011 1121   K 4.4 12/16/2009 1052   CL 100 05/16/2011 1121   CL 101 12/16/2009 1052   CO2 31 05/16/2011 1121   CO2 30 12/16/2009 1052   BUN 14 05/16/2011  1121   BUN 16 12/16/2009 1052   CREATININE 0.5 05/16/2011 1121   CREATININE 0.6 12/16/2009 1052      Component Value Date/Time   CALCIUM 9.8 05/16/2011 1121   CALCIUM 9.4 12/16/2009 1052   ALKPHOS 48 12/20/2010 1051   ALKPHOS 48 12/20/2010 1051   ALKPHOS 48 12/20/2010 1051   ALKPHOS 48 12/16/2009 1052   AST 13 12/20/2010 1051   AST 13 12/20/2010 1051   AST 13 12/20/2010 1051   AST 18 12/16/2009 1052   ALT 10 12/20/2010 1051   ALT 10 12/20/2010 1051   ALT 10 12/20/2010 1051   BILITOT 0.2* 12/20/2010 1051   BILITOT 0.2* 12/20/2010 1051   BILITOT 0.2* 12/20/2010 1051   BILITOT 0.50 12/16/2009 1052       RADIOGRAPHIC STUDIES:   ASSESSMENT: 78 year old female with  #1 DCIS of left breast diagnosed in 2010 status post lumpectomy followed by radiation and now on tamoxifen. Unfortunate she is not taking her tamoxifen regularly.  #2 patient has other multiple medical problems which are managed by her primary care physician.   PLAN:   #1 I have encouraged her to continue taking tamoxifen 20 mg daily as much as she possibly can.  #2 I will see her back in 6 months time.   All questions were answered. The patient knows to call the clinic with any problems, questions or concerns. We can certainly see the patient much sooner if necessary.  I spent 25 minutes counseling the patient face to face. The total time spent in the appointment was 25 minutes.    Drue Second, MD Medical/Oncology [facility] [phone] (beeper) [phone] (Office)  06/09/2011, 11:38 AM

## 2011-06-09 NOTE — Telephone Encounter (Signed)
gve the pt her oct 2013 appt calendar °

## 2011-06-09 NOTE — Patient Instructions (Signed)
1. Continue to take your medictions.  2. I will see you back in 6 months on 10/31

## 2011-08-25 ENCOUNTER — Ambulatory Visit: Payer: Medicare Other | Admitting: Oncology

## 2011-08-25 ENCOUNTER — Other Ambulatory Visit: Payer: Medicare Other | Admitting: Lab

## 2011-10-11 ENCOUNTER — Other Ambulatory Visit: Payer: Self-pay | Admitting: *Deleted

## 2011-10-11 MED ORDER — ATENOLOL 25 MG PO TABS: 25.0000 mg | ORAL_TABLET | Freq: Every day | ORAL | Status: DC

## 2011-10-14 ENCOUNTER — Telehealth: Payer: Self-pay | Admitting: Family Medicine

## 2011-10-14 ENCOUNTER — Encounter: Payer: Self-pay | Admitting: Family Medicine

## 2011-10-14 ENCOUNTER — Ambulatory Visit (INDEPENDENT_AMBULATORY_CARE_PROVIDER_SITE_OTHER): Payer: Medicare Other | Admitting: Family Medicine

## 2011-10-14 VITALS — BP 134/74 | HR 64 | Temp 98.3°F | Resp 20 | Ht 65.0 in | Wt 156.8 lb

## 2011-10-14 DIAGNOSIS — E7800 Pure hypercholesterolemia, unspecified: Secondary | ICD-10-CM

## 2011-10-14 DIAGNOSIS — F0390 Unspecified dementia without behavioral disturbance: Secondary | ICD-10-CM

## 2011-10-14 DIAGNOSIS — M8580 Other specified disorders of bone density and structure, unspecified site: Secondary | ICD-10-CM

## 2011-10-14 DIAGNOSIS — Z0000 Encounter for general adult medical examination without abnormal findings: Secondary | ICD-10-CM | POA: Diagnosis not present

## 2011-10-14 DIAGNOSIS — M899 Disorder of bone, unspecified: Secondary | ICD-10-CM

## 2011-10-14 DIAGNOSIS — I10 Essential (primary) hypertension: Secondary | ICD-10-CM

## 2011-10-14 DIAGNOSIS — H9190 Unspecified hearing loss, unspecified ear: Secondary | ICD-10-CM

## 2011-10-14 DIAGNOSIS — G609 Hereditary and idiopathic neuropathy, unspecified: Secondary | ICD-10-CM

## 2011-10-14 DIAGNOSIS — G629 Polyneuropathy, unspecified: Secondary | ICD-10-CM

## 2011-10-14 MED ORDER — ATENOLOL 25 MG PO TABS: 25.0000 mg | ORAL_TABLET | Freq: Every day | ORAL | Status: DC

## 2011-10-14 NOTE — Progress Notes (Signed)
Subjective:    Patient ID: [name] [name], female    DOB: 01-Feb-1934, 78 y.o.   MRN: [id number]  HPI I have personally reviewed the Medicare Annual Wellness questionnaire and have noted 1. The patient's medical and social history 2. Their use of alcohol, tobacco or illicit drugs 3. Their current medications and supplements 4. The patient's functional ability including ADL's, fall risks, home safety risks and hearing or visual             impairment. 5. Diet and physical activities 6. Evidence for depression or mood disorders The patients weight, height, BMI and visual acuity have been recorded in the chart I have made referrals, counseling and provided education to the patient based review of the above and I have provided the pt with a written personalized care plan for preventive services.  Dementia, mild: per husband today memory is not any better, but not worse. Never took aricept as discussed at previous OV. She does not recall anything about aricept. She threw it in the trash, but she does not recall. May have been causing some upset stomach.  08/10/2010    MMSE 26-27/30 most issue is date and short term recall.   Pt does not have significant concerns.   I have asked her to bring her husband for discussion if he has concerns in this area to next appt.   Lab eval for secondary cause showed no anemia, nml B12, normal thyroid   Her mother did have alzheimer's dz.   No suggestion of depression.   Recent diagnosis of peripheral neuropathy: started on thiamin for  Low levels . She reports this is better now.  Hypertension:  Well controlled on lisinopril  Using medication without problems or lightheadedness: None Chest pain with exertion:None Edema:None Short of breath:None Average home BPs: not checking Other issues:  Elevated Cholesterol: Due for re-eval on zocor 40 mg daily Lab Results  Component Value Date   CHOL 190 07/23/2009   HDL 50.30 07/23/2009   LDLCALC 111* 07/23/2009   LDLDIRECT 160.5 05/09/2008   TRIG 145.0 07/23/2009   CHOLHDL 4 07/23/2009    Using medications without problems: Muscle aches:  Diet compliance: Exercise: Other complaints:   Review of Systems  Constitutional: Negative for fever, fatigue and unexpected weight change.  HENT: Negative for ear pain, congestion, sore throat, sneezing, trouble swallowing and sinus pressure.   Eyes: Negative for pain and itching.  Respiratory: Negative for cough, shortness of breath and wheezing.   Cardiovascular: Negative for chest pain, palpitations and leg swelling.  Gastrointestinal: Negative for nausea, abdominal pain, diarrhea, constipation and blood in stool.  Genitourinary: Negative for dysuria, hematuria, vaginal discharge and difficulty urinating.  Skin: Negative for rash.  Neurological: Positive for numbness. Negative for syncope, weakness, light-headedness and headaches.  Psychiatric/Behavioral: Positive for confusion. Negative for dysphoric mood. The patient is not nervous/anxious.        Objective:   Physical Exam  Constitutional: Vital signs are normal. She appears well-developed and well-nourished. She is cooperative.  Non-toxic appearance. She does not appear ill. No distress.  HENT:  Head: Normocephalic.  Right Ear: Hearing, tympanic membrane, external ear and ear canal normal.  Left Ear: Hearing, tympanic membrane, external ear and ear canal normal.  Nose: Nose normal.  Eyes: Conjunctivae normal, EOM and lids are normal. Pupils are equal, round, and reactive to light. No foreign bodies found.  Neck: Trachea normal and normal range of motion. Neck supple. Carotid bruit is not present. No mass and no  thyromegaly present.  Cardiovascular: Normal rate, regular rhythm, S1 normal, S2 normal, normal heart sounds and intact distal pulses.  Exam reveals no gallop and no friction rub.   No murmur heard. Pulmonary/Chest: Effort normal and breath sounds normal. No respiratory distress. She has no  wheezes. She has no rhonchi. She has no rales. She exhibits no tenderness.  Abdominal: Soft. Normal appearance and bowel sounds are normal. She exhibits no distension, no fluid wave, no abdominal bruit and no mass. There is no hepatosplenomegaly. There is no tenderness. There is no rebound, no guarding and no CVA tenderness. No hernia.  Lymphadenopathy:    She has no cervical adenopathy.    She has no axillary adenopathy.  Neurological: She is alert. She has normal strength. No cranial nerve deficit or sensory deficit. Coordination normal.  Skin: Skin is warm, dry and intact. No rash noted.  Psychiatric: She has a normal mood and affect. Her speech is normal and behavior is normal. Judgment normal. Her mood appears not anxious. She does not exhibit a depressed mood. She exhibits abnormal recent memory.          Assessment & Plan:  The patient's preventative maintenance and recommended screening tests for an annual wellness exam were reviewed in full today. Brought up to date unless services declined.  Counselled on the importance of diet, exercise, and its role in overall health and mortality. The patient's FH and SH was reviewed, including their home life, tobacco status, and drug and alcohol status.    Vaccines:uptodate PNA and td, deferred shingles in 2011 and again this year DVE/pap:m not indicated DEXA: 2011 osteopenia, due this year for repeat  Mammo: hx of breast cancer, 04/2011 mammo stable, followed by Dr. Welton Flakes (has appt in 11/2011)  Colon: 02/2008 abnormal, recommend repeat in 03/2011 Dr. Russella Dar

## 2011-10-14 NOTE — Patient Instructions (Addendum)
Return for fasting labs in next week.  Stop at front desk to schedule bone density.. Can do next April with mammogram. Call Dr. Ardell Isaacs office to schedule 3 year follow up colonoscopy this year.  Stop at front desk to schedule MRI.  Make follow up here to discuss results after MRI returns. Referral to audiologist for hearing screen.

## 2011-10-14 NOTE — Telephone Encounter (Signed)
rx sent

## 2011-10-14 NOTE — Telephone Encounter (Signed)
Pt is needing refill on Atenolol pt uses [facility].

## 2011-10-14 NOTE — Assessment & Plan Note (Addendum)
MMSE has dropped 7 point since last year.. Could no longer note date, year, season, could not spell world backwards , poor short term recall.  Could name only 8/15 animals in 1 minute. Further decline in memory on no medicaiton. Will send for MRI. HAve pt follow up in next few weeks to discuss results further, med vs referral to neuro.

## 2011-10-14 NOTE — Telephone Encounter (Signed)
No answer and no v/m. Will try later.

## 2011-10-20 ENCOUNTER — Other Ambulatory Visit (INDEPENDENT_AMBULATORY_CARE_PROVIDER_SITE_OTHER): Payer: Medicare Other

## 2011-10-20 DIAGNOSIS — G609 Hereditary and idiopathic neuropathy, unspecified: Secondary | ICD-10-CM | POA: Diagnosis not present

## 2011-10-20 DIAGNOSIS — E7800 Pure hypercholesterolemia, unspecified: Secondary | ICD-10-CM

## 2011-10-20 DIAGNOSIS — G629 Polyneuropathy, unspecified: Secondary | ICD-10-CM

## 2011-10-20 DIAGNOSIS — I10 Essential (primary) hypertension: Secondary | ICD-10-CM

## 2011-10-20 LAB — LIPID PANEL
Total CHOL/HDL Ratio: 3
Triglycerides: 147 mg/dL (ref 0.0–149.0)

## 2011-10-20 LAB — COMPREHENSIVE METABOLIC PANEL
ALT: 9 U/L (ref 0–35)
CO2: 28 mEq/L (ref 19–32)
Calcium: 9 mg/dL (ref 8.4–10.5)
Chloride: 102 mEq/L (ref 96–112)
Creatinine, Ser: 0.6 mg/dL (ref 0.4–1.2)
GFR: 105.35 mL/min (ref >60.00)
Sodium: 138 mEq/L (ref 135–145)
Total Protein: 6.8 g/dL (ref 6.0–8.3)

## 2011-10-21 ENCOUNTER — Ambulatory Visit
Admission: RE | Admit: 2011-10-21 | Discharge: 2011-10-21 | Disposition: A | Discharge status: Home / Self Care | Payer: Medicare Other | Source: Ambulatory Visit | Attending: Family Medicine | Admitting: Family Medicine

## 2011-10-21 DIAGNOSIS — F0390 Unspecified dementia without behavioral disturbance: Secondary | ICD-10-CM | POA: Diagnosis not present

## 2011-10-25 ENCOUNTER — Encounter: Payer: Self-pay | Admitting: *Deleted

## 2011-10-25 ENCOUNTER — Other Ambulatory Visit: Payer: Self-pay | Admitting: Family Medicine

## 2011-10-25 DIAGNOSIS — F0390 Unspecified dementia without behavioral disturbance: Secondary | ICD-10-CM

## 2011-10-26 ENCOUNTER — Encounter: Payer: Self-pay | Admitting: Gastroenterology

## 2011-11-04 ENCOUNTER — Encounter: Payer: Self-pay | Admitting: Family Medicine

## 2011-11-04 ENCOUNTER — Ambulatory Visit (INDEPENDENT_AMBULATORY_CARE_PROVIDER_SITE_OTHER): Payer: Medicare Other | Admitting: Family Medicine

## 2011-11-04 VITALS — BP 134/71 | HR 64 | Temp 98.6°F | Ht 65.5 in | Wt 156.5 lb

## 2011-11-04 DIAGNOSIS — F0390 Unspecified dementia without behavioral disturbance: Secondary | ICD-10-CM

## 2011-11-04 DIAGNOSIS — E7800 Pure hypercholesterolemia, unspecified: Secondary | ICD-10-CM

## 2011-11-04 NOTE — Progress Notes (Signed)
  Subjective:    Patient ID: [name] [name], female    DOB: December 07, 1933, 78 y.o.   MRN: [id number]  HPI  Moderate dementia: MRI performed showed atrophy but no specific changes.  Pt is here today to discuss results.  She has a neuro  (Dr. Marjory Lies) referral in place.. Has appt for next week.  Has hearing evaluation set.  Denies depression, anxiety.  No routine exercsie. No recent falls.  Reviewed recent labs... B1 now nml, nml cholesterol no DM, previous RPR, B12, TSH nml in 05/2011.  Review of Systems  Constitutional: Negative for fever and fatigue.  HENT: Negative for ear pain.   Eyes: Negative for pain.  Respiratory: Negative for chest tightness and shortness of breath.   Cardiovascular: Negative for chest pain, palpitations and leg swelling.  Gastrointestinal: Negative for abdominal pain.  Genitourinary: Negative for dysuria.       Objective:   Physical Exam  Constitutional: Vital signs are normal. She appears well-developed and well-nourished. She is cooperative.  Non-toxic appearance. She does not appear ill. No distress.  HENT:  Head: Normocephalic.  Right Ear: Hearing, tympanic membrane, external ear and ear canal normal. Tympanic membrane is not erythematous, not retracted and not bulging.  Left Ear: Hearing, tympanic membrane, external ear and ear canal normal. Tympanic membrane is not erythematous, not retracted and not bulging.  Nose: No mucosal edema or rhinorrhea. Right sinus exhibits no maxillary sinus tenderness and no frontal sinus tenderness. Left sinus exhibits no maxillary sinus tenderness and no frontal sinus tenderness.  Mouth/Throat: Uvula is midline, oropharynx is clear and moist and mucous membranes are normal.  Eyes: Conjunctivae normal, EOM and lids are normal. Pupils are equal, round, and reactive to light. No foreign bodies found.  Neck: Trachea normal and normal range of motion. Neck supple. Carotid bruit is not present. No mass and no thyromegaly  present.  Cardiovascular: Normal rate, regular rhythm, S1 normal, S2 normal, normal heart sounds, intact distal pulses and normal pulses.  Exam reveals no gallop and no friction rub.   No murmur heard. Pulmonary/Chest: Effort normal and breath sounds normal. Not tachypneic. No respiratory distress. She has no decreased breath sounds. She has no wheezes. She has no rhonchi. She has no rales.  Abdominal: Soft. Normal appearance and bowel sounds are normal. There is no tenderness.  Neurological: She is alert.  Skin: Skin is warm, dry and intact. No rash noted.  Psychiatric: Her speech is normal and behavior is normal. Judgment and thought content normal. Her mood appears not anxious. Cognition and memory are impaired. She does not exhibit a depressed mood. She exhibits abnormal recent memory. She exhibits normal remote memory.          Assessment & Plan:

## 2011-11-04 NOTE — Assessment & Plan Note (Signed)
At goal on current meds. 

## 2011-11-04 NOTE — Assessment & Plan Note (Signed)
MRI reviewed in detail. Lab work up negative, b1 now in nml range. Keep appt with Dr Marjory Lies to disucss any further work up and medications.   Of note pt given Aricept in past but threw away because she thought she did not need. Family feels pt definitely needs med to stay where her memory is now for longer. Pt is now more accepting to considering medication. Wi

## 2011-11-04 NOTE — Patient Instructions (Addendum)
Keep appt with neurology for further eval and treatment.

## 2011-11-09 DIAGNOSIS — F068 Other specified mental disorders due to known physiological condition: Secondary | ICD-10-CM | POA: Diagnosis not present

## 2011-11-09 DIAGNOSIS — G250 Essential tremor: Secondary | ICD-10-CM | POA: Diagnosis not present

## 2011-11-09 DIAGNOSIS — G252 Other specified forms of tremor: Secondary | ICD-10-CM | POA: Diagnosis not present

## 2011-11-10 ENCOUNTER — Telehealth: Payer: Self-pay | Admitting: *Deleted

## 2011-11-10 NOTE — Telephone Encounter (Signed)
Daughter Aram Beecham) came in for her Dad Miia Blanks, Sr. who is a patient of Dr. Para March) concerning [name] [name] (who is your patient) being diagnosed with moderate Alzheimer's.  She feels that they all need to talk to you about the patient.  They say her Neurologist is Dr. Concepcion Elk (? Sp)  Should we try to schedule an appt?

## 2011-11-11 NOTE — Telephone Encounter (Signed)
Discussed with daughter in law and son. Dr. Lewie Loron stated she has DX of  moderate alzheimer's. Aram Beecham states that she was started on medication, not sure which med. She concerned that the husband does not know how to handle the Alzheimer's in spouse. They are arguing constantly.  I recommended that Mr. Katina Degree Molly Maduro) make an appt with myself or his PCP to discuss coping mechanisms, stress etc, ways to deal with an Alzhemier's pt.one on one.  I also recommended alzheimer's spousal support group, and or pshycologist referral...son stated Mr. Gramlich would likely ":clam up" in these settings.  Most improvtantly Mr. Grieb needs to go to her appts with her to the neuro to get as much info about what to expect as possible.  Also recommended that if Mrs. Neil Crouch is having feelings of depression over the diagnosis to let me know.  They will call for an appt with myself or Dr. Algis Downs next week.

## 2011-11-11 NOTE — Telephone Encounter (Signed)
You do have permission listed on the snapshot to talk to Aram Beecham, the daughter.

## 2011-11-11 NOTE — Telephone Encounter (Signed)
I just recently saw pt.. Does not need to come back in. I will call the daughter on the phone to discuss if we have a HIPPA release  For me to do so..I don't know where to look for this.. Let me know if I have formal permission to talk to the daughter on the phone or if not have a form completed.

## 2011-11-13 NOTE — Telephone Encounter (Signed)
Noted.  Will await input form Mr Wakeham.

## 2011-11-14 ENCOUNTER — Ambulatory Visit (AMBULATORY_SURGERY_CENTER): Payer: Medicare Other | Admitting: *Deleted

## 2011-11-14 VITALS — Ht 65.0 in | Wt 159.3 lb

## 2011-11-14 DIAGNOSIS — Z86010 Personal history of colonic polyps: Secondary | ICD-10-CM

## 2011-11-14 DIAGNOSIS — Z1211 Encounter for screening for malignant neoplasm of colon: Secondary | ICD-10-CM

## 2011-11-14 MED ORDER — MOVIPREP 100 G PO SOLR: ORAL | Status: DC

## 2011-11-14 NOTE — Progress Notes (Signed)
Patient's daughter-in-law at her side, helping explain the procedure&consent to patient.

## 2011-11-18 ENCOUNTER — Encounter: Payer: Self-pay | Admitting: Gastroenterology

## 2011-11-21 DIAGNOSIS — H908 Mixed conductive and sensorineural hearing loss, unspecified: Secondary | ICD-10-CM | POA: Diagnosis not present

## 2011-11-21 DIAGNOSIS — H903 Sensorineural hearing loss, bilateral: Secondary | ICD-10-CM | POA: Diagnosis not present

## 2011-11-24 ENCOUNTER — Encounter: Payer: Self-pay | Admitting: Family Medicine

## 2011-11-24 ENCOUNTER — Ambulatory Visit (INDEPENDENT_AMBULATORY_CARE_PROVIDER_SITE_OTHER): Payer: Medicare Other | Admitting: Family Medicine

## 2011-11-24 VITALS — BP 124/78 | HR 68 | Temp 98.3°F | Wt 159.5 lb

## 2011-11-24 DIAGNOSIS — H918X9 Other specified hearing loss, unspecified ear: Secondary | ICD-10-CM

## 2011-11-24 DIAGNOSIS — H6120 Impacted cerumen, unspecified ear: Secondary | ICD-10-CM | POA: Diagnosis not present

## 2011-11-24 NOTE — Progress Notes (Signed)
  Subjective:    Patient ID: [name] [name], female    DOB: [birth year], 78 y.o.   MRN: [id number]  HPI   No charge for visit given ears should have been examined at last OV before referral.   B ears cleaned with bulb syringe and warm water. No complications. Review of Systems     Objective:   Physical Exam        Assessment & Plan:

## 2011-11-24 NOTE — Patient Instructions (Addendum)
Cerumenex or debrox ear wax drops in left ear several times a day for one week. Place then lie on side or use cotton ball in external ear to keep drops from spilling out.  return in 1 week for bulb suction if wax does not come out on its own or if hearing not better on left.

## 2011-11-29 ENCOUNTER — Encounter: Payer: Self-pay | Admitting: Family Medicine

## 2011-12-01 ENCOUNTER — Ambulatory Visit (AMBULATORY_SURGERY_CENTER): Payer: Medicare Other | Admitting: Gastroenterology

## 2011-12-01 ENCOUNTER — Encounter: Payer: Self-pay | Admitting: Gastroenterology

## 2011-12-01 VITALS — BP 158/70 | HR 70 | Temp 98.7°F | Resp 20 | Ht 65.0 in | Wt 159.0 lb

## 2011-12-01 DIAGNOSIS — Z860100 Personal history of colon polyps, unspecified: Secondary | ICD-10-CM

## 2011-12-01 DIAGNOSIS — D126 Benign neoplasm of colon, unspecified: Secondary | ICD-10-CM

## 2011-12-01 DIAGNOSIS — F0390 Unspecified dementia without behavioral disturbance: Secondary | ICD-10-CM | POA: Diagnosis not present

## 2011-12-01 DIAGNOSIS — Z1211 Encounter for screening for malignant neoplasm of colon: Secondary | ICD-10-CM | POA: Diagnosis not present

## 2011-12-01 MED ORDER — SODIUM CHLORIDE 0.9 % IV SOLN: 500.0000 mL | INTRAVENOUS | Status: DC

## 2011-12-01 NOTE — Op Note (Signed)
[facility] [address] [place], [address]   COLONOSCOPY PROCEDURE REPORT  PATIENT: [name] [name], [name] [name]  MR#: [id number] BIRTHDATE: 28-Oct-1933 , 78  yrs. old GENDER: Female ENDOSCOPIST: Meryl Dare, MD, [facility]  PROCEDURE DATE:  12/01/2011 PROCEDURE:   Colonoscopy with biopsy ASA CLASS:   Class II INDICATIONS:patient's personal history of adenomatous colon polyps.  MEDICATIONS: MAC sedation, administered by CRNA and propofol (Diprivan) 220mg  IV DESCRIPTION OF PROCEDURE:   After the risks benefits and alternatives of the procedure were thoroughly explained, informed consent was obtained.  A digital rectal exam revealed no abnormalities of the rectum.   The LB [id number]-[id number]  endoscope was introduced through the anus and advanced to the cecum, which was identified by both the appendix and ileocecal valve. No adverse events experienced.   The quality of the prep was good, using MoviPrep  The instrument was then slowly withdrawn as the colon was fully examined.   COLON FINDINGS: A sessile polyp measuring 6 mm in size was found in the sigmoid colon.  A polypectomy was performed with cold forceps. The resection was complete and the polyp tissue was completely retrieved.   Mild diverticulosis was noted.   The colon was otherwise normal.  There was no diverticulosis, inflammation, polyps or cancers unless previously stated.  Retroflexed views revealed small internal hemorrhoids. The time to cecum=2 minutes 12 seconds.  Withdrawal time=12 minutes 09 seconds.  The scope was withdrawn and the procedure completed. COMPLICATIONS: There were no complications.  ENDOSCOPIC IMPRESSION: 1.   Sessile polyp measuring 6 mm in size was found in the sigmoid colon; polypectomy was performed with cold forceps 2.   Mild diverticulosis was noted 3.   Small internal hemorrhoids  RECOMMENDATIONS: 1.  Await pathology results 2.  High fiber diet with liberal fluid intake. 3.   Given your age, you will not need another colonoscopy for colon cancer screening or polyp surveillance.  These types of tests usually stop around the age 68.  eSigned:  Meryl Dare, MD, [facility] 12/01/2011 11:16 AM

## 2011-12-01 NOTE — Progress Notes (Signed)
Abdominal pressure applied to reach the cecum.

## 2011-12-01 NOTE — Patient Instructions (Addendum)
Findings:  Polyp, Mild Diverticulosis, Small Internal Hemorrhoids Recommendations:  High Fiber Diet with Liberal Fluid Intake  YOU HAD AN ENDOSCOPIC PROCEDURE TODAY AT THE [facility]: Refer to the procedure report that was given to you for any specific questions about what was found during the examination.  If the procedure report does not answer your questions, please call your gastroenterologist to clarify.  If you requested that your care partner not be given the details of your procedure findings, then the procedure report has been included in a sealed envelope for you to review at your convenience later.  YOU SHOULD EXPECT: Some feelings of bloating in the abdomen. Passage of more gas than usual.  Walking can help get rid of the air that was put into your GI tract during the procedure and reduce the bloating. If you had a lower endoscopy (such as a colonoscopy or flexible sigmoidoscopy) you may notice spotting of blood in your stool or on the toilet paper. If you underwent a bowel prep for your procedure, then you may not have a normal bowel movement for a few days.  DIET: Your first meal following the procedure should be a light meal and then it is ok to progress to your normal diet.  A half-sandwich or bowl of soup is an example of a good first meal.  Heavy or fried foods are harder to digest and may make you feel nauseous or bloated.  Likewise meals heavy in dairy and vegetables can cause extra gas to form and this can also increase the bloating.  Drink plenty of fluids but you should avoid alcoholic beverages for 24 hours.  ACTIVITY: Your care partner should take you home directly after the procedure.  You should plan to take it easy, moving slowly for the rest of the day.  You can resume normal activity the day after the procedure however you should NOT DRIVE or use heavy machinery for 24 hours (because of the sedation medicines used during the test).    SYMPTOMS TO REPORT  IMMEDIATELY: A gastroenterologist can be reached at any hour.  During normal business hours, 8:30 AM to 5:00 PM Monday through Friday, call [phone].  After hours and on weekends, please call the GI answering service at [phone] who will take a message and have the physician on call contact you.   Following lower endoscopy (colonoscopy or flexible sigmoidoscopy):  Excessive amounts of blood in the stool  Significant tenderness or worsening of abdominal pains  Swelling of the abdomen that is new, acute  Fever of 100F or higher  Following upper endoscopy (EGD)  Vomiting of blood or coffee ground material  New chest pain or pain under the shoulder blades  Painful or persistently difficult swallowing  New shortness of breath  Fever of 100F or higher  Black, tarry-looking stools  FOLLOW UP: If any biopsies were taken you will be contacted by phone or by letter within the next 1-3 weeks.  Call your gastroenterologist if you have not heard about the biopsies in 3 weeks.  Our staff will call the home number listed on your records the next business day following your procedure to check on you and address any questions or concerns that you may have at that time regarding the information given to you following your procedure. This is a courtesy call and so if there is no answer at the home number and we have not heard from you through the emergency physician on call, we will  assume that you have returned to your regular daily activities without incident.  SIGNATURES/CONFIDENTIALITY: You and/or your care partner have signed paperwork which will be entered into your electronic medical record.  These signatures attest to the fact that that the information above on your After Visit Summary has been reviewed and is understood.  Full responsibility of the confidentiality of this discharge information lies with you and/or your care-partner.   Please follow all discharge instructions given to you  by the recovery room nurse. If you have any questions or problems after discharge please call one of the numbers listed above. You will receive a phone call in the am to see how you are doing and answer any questions you may have. Thank you for choosing [facility] for your health care needs.

## 2011-12-01 NOTE — Progress Notes (Signed)
Patient did not experience any of the following events: a burn prior to discharge; a fall within the facility; wrong site/side/patient/procedure/implant event; or a hospital transfer or hospital admission upon discharge from the facility. (G8907) Patient did not have preoperative order for IV antibiotic SSI prophylaxis. (G8918)  

## 2011-12-02 ENCOUNTER — Telehealth: Payer: Self-pay

## 2011-12-02 NOTE — Telephone Encounter (Signed)
  Follow up Call-  Call back number 12/01/2011  Post procedure Call Back phone  # [phone]     Patient questions:  Do you have a fever, pain , or abdominal swelling? no Pain Score  0 *  Have you tolerated food without any problems? yes  Have you been able to return to your normal activities? yes  Do you have any questions about your discharge instructions: Diet   no Medications  no Follow up visit  no  Do you have questions or concerns about your Care? no  Actions: * If pain score is 4 or above: No action needed, pain <4.  Per the pt, "everthing went well". Maw

## 2011-12-07 ENCOUNTER — Encounter: Payer: Self-pay | Admitting: Gastroenterology

## 2011-12-15 ENCOUNTER — Telehealth: Payer: Self-pay | Admitting: Oncology

## 2011-12-15 ENCOUNTER — Ambulatory Visit (HOSPITAL_BASED_OUTPATIENT_CLINIC_OR_DEPARTMENT_OTHER): Payer: Medicare Other | Admitting: Oncology

## 2011-12-15 ENCOUNTER — Encounter: Payer: Self-pay | Admitting: Oncology

## 2011-12-15 ENCOUNTER — Other Ambulatory Visit (HOSPITAL_BASED_OUTPATIENT_CLINIC_OR_DEPARTMENT_OTHER): Payer: Medicare Other | Admitting: Lab

## 2011-12-15 VITALS — BP 135/73 | HR 83 | Temp 98.8°F | Resp 20 | Ht 65.0 in | Wt 162.4 lb

## 2011-12-15 DIAGNOSIS — D0590 Unspecified type of carcinoma in situ of unspecified breast: Secondary | ICD-10-CM

## 2011-12-15 LAB — CBC WITH DIFFERENTIAL/PLATELET
BASO%: 0.5 % (ref 0.0–2.0)
Basophils Absolute: 0 10*3/uL (ref 0.0–0.1)
Eosinophils Absolute: 0.1 10*3/uL (ref 0.0–0.5)
HCT: 38.1 % (ref 34.8–46.6)
HGB: 13 g/dL (ref 11.6–15.9)
LYMPH%: 22.3 % (ref 14.0–49.7)
MCHC: 34.1 g/dL (ref 31.5–36.0)
MONO#: 0.5 10*3/uL (ref 0.1–0.9)
NEUT#: 5.7 10*3/uL (ref 1.5–6.5)
NEUT%: 69.3 % (ref 38.4–76.8)
Platelets: 229 10*3/uL (ref 145–400)
WBC: 8.3 10*3/uL (ref 3.9–10.3)
lymph#: 1.9 10*3/uL (ref 0.9–3.3)

## 2011-12-15 LAB — COMPREHENSIVE METABOLIC PANEL (CC13)
AST: 13 U/L (ref 5–34)
Albumin: 3.4 g/dL — ABNORMAL LOW (ref 3.5–5.0)
Alkaline Phosphatase: 66 U/L (ref 40–150)
BUN: 17 mg/dL (ref 7.0–26.0)
Potassium: 4.3 mEq/L (ref 3.5–5.1)
Total Bilirubin: 0.21 mg/dL (ref 0.20–1.20)

## 2011-12-15 NOTE — Telephone Encounter (Signed)
gve the pt her nov 2014 appt calendar °

## 2011-12-15 NOTE — Progress Notes (Signed)
OFFICE PROGRESS NOTE  CC  Kerby Nora, MD [address] [place] [address] Dr. Ovidio Kin Dr. 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 Tumor was ER +100%.  #2 patient underwent radiation therapy to the left breast from 09/11/2008 to 10/09/2008.  #3 she was then begun on tamoxifen 20 mg starting in August 2010.  CURRENT THERAPY:Tamoxifen 20 mg daily since August 2010.  INTERVAL HISTORY: [name] [name] 78 y.o. female returns for Followup visit. She is accompanied by her husband as well as her daughter-in-law.  She herself feels well no fevers chills night sweats headaches she has no shortness of breath no chest pains no palpitations no myalgias or arthralgias no vaginal bleeding. No hot flashes remainder of the 10 point review of systems is negative.  MEDICAL HISTORY: Past Medical History  Diagnosis Date  . Diverticulosis   . HTN (hypertension)   . Osteopenia   . Hyperlipidemia   . Breast cancer     ALLERGIES:  is allergic to sulfonamide derivatives and sulfur.  MEDICATIONS:  Current Outpatient Prescriptions  Medication Sig Dispense Refill  . atenolol (TENORMIN) 25 MG tablet Take 1 tablet (25 mg total) by mouth daily.  30 tablet  11  . Calcium Carbonate-Vitamin D (CALCIUM 600+D) 600-200 MG-UNIT TABS Take 2 tablets by mouth.        Marland Kitchen lisinopril (PRINIVIL,ZESTRIL) 10 MG tablet Take 1 tablet (10 mg total) by mouth daily.  30 tablet  11  . Memantine HCl ER (NAMENDA XR) 14 MG CP24 Take 1 tablet by mouth daily. Taking 28mg  daily      . simvastatin (ZOCOR) 40 MG tablet Take 40 mg by mouth daily.       . tamoxifen (NOLVADEX) 20 MG tablet Take 20 mg by mouth.       . thiamine (VITAMIN B-1) 100  MG tablet Take 100 mg by mouth daily.        SURGICAL HISTORY:  Past Surgical History  Procedure Date  . Breast lumpectomy 2007    left breast  . Right cataract extraction     REVIEW OF SYSTEMS:  Pertinent items are noted in HPI.   PHYSICAL EXAMINATION: General appearance: alert, cooperative and appears stated age Neck: no adenopathy, no carotid bruit, no JVD, supple, symmetrical, trachea midline and thyroid not enlarged, symmetric, no tenderness/mass/nodules Lymph nodes: Cervical, supraclavicular, and axillary nodes normal. Resp: clear to auscultation bilaterally Back: symmetric, no curvature. ROM normal. No CVA tenderness. Cardio: regular rate and rhythm, S1, S2 normal, no murmur, click, rub or gallop GI: soft, non-tender; bowel sounds normal; no masses,  no organomegaly Extremities: extremities normal, atraumatic, no cyanosis or edema Neurologic: Grossly normal Bilateral breast examination right breast no masses nipple discharge or skin changes. Left breast reveals well-healed incisional scar no masses nipple discharge. ECOG PERFORMANCE STATUS: 1 - Symptomatic but completely ambulatory  Blood pressure 135/73, pulse 83, temperature 98.8 F (37.1 C), temperature source Oral, resp. rate 20, height 5\' 5"  (1.651 m), weight 162 lb 6.4 oz (73.664 kg).  LABORATORY DATA: Lab Results  Component Value Date   WBC 8.3 12/15/2011   HGB 13.0 12/15/2011   HCT 38.1 12/15/2011   MCV 95.3 12/15/2011   PLT 229 12/15/2011  Chemistry      Component Value Date/Time   NA 139 12/15/2011 1132   NA 138 10/20/2011 0805   NA 141 12/16/2009 1052   K 4.3 12/15/2011 1132   K 4.0 10/20/2011 0805   K 4.4 12/16/2009 1052   CL 104 12/15/2011 1132   CL 102 10/20/2011 0805   CL 101 12/16/2009 1052   CO2 29 12/15/2011 1132   CO2 28 10/20/2011 0805   CO2 30 12/16/2009 1052   BUN 17.0 12/15/2011 1132   BUN 10 10/20/2011 0805   BUN 16 12/16/2009 1052   CREATININE 0.6 12/15/2011 1132   CREATININE 0.6 10/20/2011  0805   CREATININE 0.6 12/16/2009 1052      Component Value Date/Time   CALCIUM 9.8 12/15/2011 1132   CALCIUM 9.0 10/20/2011 0805   CALCIUM 9.4 12/16/2009 1052   ALKPHOS 66 12/15/2011 1132   ALKPHOS 48 10/20/2011 0805   ALKPHOS 48 12/16/2009 1052   AST 13 12/15/2011 1132   AST 13 10/20/2011 0805   AST 18 12/16/2009 1052   ALT 14 12/15/2011 1132   ALT 9 10/20/2011 0805   BILITOT 0.21 12/15/2011 1132   BILITOT 0.4 10/20/2011 0805   BILITOT 0.50 12/16/2009 1052       RADIOGRAPHIC STUDIES:   ASSESSMENT: 78 year old female with  #1 DCIS of left breast diagnosed in 2010 status post lumpectomy followed by radiation and now on tamoxifen.   #2 patient has other multiple medical problems which are managed by her primary care physician.   PLAN:   #1Continue tamoxifen 20 mg daily  #2 I will see her back in 1 year..   All questions were answered. The patient knows to call the clinic with any problems, questions or concerns. We can certainly see the patient much sooner if necessary.  I spent 15 minutes counseling the patient face to face. The total time spent in the appointment was 25 minutes.    Drue Second, MD Medical/Oncology [facility] [phone] (beeper) [phone] (Office)  12/15/2011, 1:24 PM

## 2011-12-15 NOTE — Patient Instructions (Addendum)
Continue tamoxifen daily  I will see you back in 1 year

## 2012-01-16 DIAGNOSIS — F068 Other specified mental disorders due to known physiological condition: Secondary | ICD-10-CM | POA: Diagnosis not present

## 2012-01-27 ENCOUNTER — Other Ambulatory Visit (INDEPENDENT_AMBULATORY_CARE_PROVIDER_SITE_OTHER): Payer: Medicare Other

## 2012-01-27 ENCOUNTER — Telehealth: Payer: Self-pay | Admitting: Family Medicine

## 2012-01-27 DIAGNOSIS — E7800 Pure hypercholesterolemia, unspecified: Secondary | ICD-10-CM

## 2012-01-27 DIAGNOSIS — M899 Disorder of bone, unspecified: Secondary | ICD-10-CM

## 2012-01-27 DIAGNOSIS — M949 Disorder of cartilage, unspecified: Secondary | ICD-10-CM | POA: Diagnosis not present

## 2012-01-27 LAB — COMPREHENSIVE METABOLIC PANEL
ALT: 11 U/L (ref 0–35)
AST: 18 U/L (ref 0–37)
Albumin: 3.7 g/dL (ref 3.5–5.2)
Alkaline Phosphatase: 55 U/L (ref 39–117)
Glucose, Bld: 89 mg/dL (ref 70–99)
Potassium: 4.4 mEq/L (ref 3.5–5.1)
Sodium: 138 mEq/L (ref 135–145)
Total Bilirubin: 0.5 mg/dL (ref 0.3–1.2)
Total Protein: 6.8 g/dL (ref 6.0–8.3)

## 2012-01-27 LAB — LIPID PANEL
HDL: 52.9 mg/dL (ref >39.00)
Total CHOL/HDL Ratio: 4
VLDL: 24.8 mg/dL (ref 0.0–40.0)

## 2012-01-27 NOTE — Telephone Encounter (Signed)
Message copied by Excell Seltzer on Fri Jan 27, 2012  2:01 PM ------      Message from: Alvina Chou      Created: Fri Jan 27, 2012  8:15 AM      Regarding: lab orders       Patient is scheduled for CPX labs, please order future labs, Thanks , Camelia Eng

## 2012-01-31 ENCOUNTER — Other Ambulatory Visit: Payer: Self-pay | Admitting: *Deleted

## 2012-01-31 DIAGNOSIS — C50919 Malignant neoplasm of unspecified site of unspecified female breast: Secondary | ICD-10-CM

## 2012-01-31 MED ORDER — TAMOXIFEN CITRATE 20 MG PO TABS: 20.0000 mg | ORAL_TABLET | Freq: Every day | ORAL | Status: DC

## 2012-02-03 ENCOUNTER — Ambulatory Visit (INDEPENDENT_AMBULATORY_CARE_PROVIDER_SITE_OTHER): Payer: Medicare Other | Admitting: Family Medicine

## 2012-02-03 ENCOUNTER — Encounter: Payer: Self-pay | Admitting: Family Medicine

## 2012-02-03 VITALS — BP 130/84 | HR 74 | Temp 98.4°F | Ht 65.0 in | Wt 166.2 lb

## 2012-02-03 DIAGNOSIS — M899 Disorder of bone, unspecified: Secondary | ICD-10-CM

## 2012-02-03 DIAGNOSIS — E7800 Pure hypercholesterolemia, unspecified: Secondary | ICD-10-CM | POA: Diagnosis not present

## 2012-02-03 DIAGNOSIS — I10 Essential (primary) hypertension: Secondary | ICD-10-CM

## 2012-02-03 DIAGNOSIS — M949 Disorder of cartilage, unspecified: Secondary | ICD-10-CM | POA: Diagnosis not present

## 2012-02-03 DIAGNOSIS — F0390 Unspecified dementia without behavioral disturbance: Secondary | ICD-10-CM | POA: Diagnosis not present

## 2012-02-03 NOTE — Assessment & Plan Note (Signed)
Not at goal LDL < 103 but previously was very well controlled on simvastatin w/o SE. Will continue and work on lifestyle changes.

## 2012-02-03 NOTE — Progress Notes (Signed)
  Subjective:    Patient ID: [name] [name], female    DOB: 1933-09-27, 78 y.o.   MRN: [id number]  HPI  78 year old female with HTN, high cholesterol, and dementia moderate presents for follow up. Initially scheduled as CPX but she had medicare wellness in 09/2011.  Elevated Cholesterol:  LDL not at goal < 130 on simvastatin 40 mg daily . Worse than last few measurements.. Previously very well controlled. Lab Results  Component Value Date   CHOL 206* 01/27/2012   HDL 52.90 01/27/2012   LDLCALC 87 10/20/2011   LDLDIRECT 143.8 01/27/2012   TRIG 124.0 01/27/2012   CHOLHDL 4 01/27/2012    Using medications without problems:None Muscle aches: None Diet compliance: Has not been sticking to diet in last few months with holidays. Exercise: wlaking frequently Other complaints:  Hypertension:  Well controlled on lisinopril and atenolol.  Using medication without problems or lightheadedness: None Chest pain with exertion:None Edema: None Short of breath:None Average home BPs: not checking Other issues:  Vit D in nml range.  Dementia, moderate Alzheimer's : On exelon patches, no SE. Followed by Dr. Marjory Lies, neuro. HAs follow up in 03/2011.   No falls.    Review of Systems  Constitutional: Negative for fever and fatigue.  HENT: Negative for ear pain.   Eyes: Negative for pain.  Respiratory: Negative for chest tightness and shortness of breath.   Cardiovascular: Negative for chest pain, palpitations and leg swelling.  Gastrointestinal: Negative for abdominal pain.  Genitourinary: Negative for dysuria.       Objective:   Physical Exam  Constitutional: Vital signs are normal. She appears well-developed and well-nourished. She is cooperative.  Non-toxic appearance. She does not appear ill. No distress.       Elderly feamle in NAD  HENT:  Head: Normocephalic.  Right Ear: Hearing, tympanic membrane, external ear and ear canal normal. Tympanic membrane is not erythematous, not  retracted and not bulging.  Left Ear: Hearing, tympanic membrane, external ear and ear canal normal. Tympanic membrane is not erythematous, not retracted and not bulging.  Nose: No mucosal edema or rhinorrhea. Right sinus exhibits no maxillary sinus tenderness and no frontal sinus tenderness. Left sinus exhibits no maxillary sinus tenderness and no frontal sinus tenderness.  Mouth/Throat: Uvula is midline, oropharynx is clear and moist and mucous membranes are normal.  Eyes: Conjunctivae normal, EOM and lids are normal. Pupils are equal, round, and reactive to light. No foreign bodies found.  Neck: Trachea normal and normal range of motion. Neck supple. Carotid bruit is not present. No mass and no thyromegaly present.  Cardiovascular: Normal rate, regular rhythm, S1 normal, S2 normal, normal heart sounds, intact distal pulses and normal pulses.  Exam reveals no gallop and no friction rub.   No murmur heard. Pulmonary/Chest: Effort normal and breath sounds normal. Not tachypneic. No respiratory distress. She has no decreased breath sounds. She has no wheezes. She has no rhonchi. She has no rales.  Abdominal: Soft. Normal appearance and bowel sounds are normal. There is no tenderness.  Neurological: She is alert.  Skin: Skin is warm, dry and intact. No rash noted.       Multiple SKs and skin tags.  Psychiatric: Her speech is normal and behavior is normal. Judgment and thought content normal. Her mood appears not anxious. Cognition and memory are normal. She does not exhibit a depressed mood.          Assessment & Plan:

## 2012-02-03 NOTE — Patient Instructions (Signed)
Get back on track with healthy eating and exercise. Continue current cholesterol medication. We will check again in 6 months.  Follow up in 6 months with labs prior.

## 2012-02-03 NOTE — Addendum Note (Signed)
Addended by: Consuello Masse on: 02/03/2012 09:27 AM   Modules accepted: Orders

## 2012-02-03 NOTE — Assessment & Plan Note (Signed)
Well controlled. Continue current medication.  

## 2012-02-03 NOTE — Assessment & Plan Note (Signed)
Vit D at goal.

## 2012-03-20 DIAGNOSIS — G252 Other specified forms of tremor: Secondary | ICD-10-CM | POA: Diagnosis not present

## 2012-03-20 DIAGNOSIS — G250 Essential tremor: Secondary | ICD-10-CM | POA: Diagnosis not present

## 2012-03-20 DIAGNOSIS — F068 Other specified mental disorders due to known physiological condition: Secondary | ICD-10-CM | POA: Diagnosis not present

## 2012-04-23 DIAGNOSIS — H2640 Unspecified secondary cataract: Secondary | ICD-10-CM | POA: Diagnosis not present

## 2012-05-07 ENCOUNTER — Other Ambulatory Visit: Payer: Self-pay

## 2012-05-07 MED ORDER — LISINOPRIL 10 MG PO TABS: 10.0000 mg | ORAL_TABLET | Freq: Every day | ORAL | Status: DC

## 2012-05-07 NOTE — Telephone Encounter (Signed)
[facility] faxed refill lisinopril.# 30 x6.

## 2012-06-11 DIAGNOSIS — Z780 Asymptomatic menopausal state: Secondary | ICD-10-CM | POA: Diagnosis not present

## 2012-06-11 DIAGNOSIS — Z853 Personal history of malignant neoplasm of breast: Secondary | ICD-10-CM | POA: Diagnosis not present

## 2012-06-11 DIAGNOSIS — Z09 Encounter for follow-up examination after completed treatment for conditions other than malignant neoplasm: Secondary | ICD-10-CM | POA: Diagnosis not present

## 2012-06-14 ENCOUNTER — Encounter: Payer: Self-pay | Admitting: Family Medicine

## 2012-06-15 ENCOUNTER — Encounter: Payer: Self-pay | Admitting: Family Medicine

## 2012-07-26 ENCOUNTER — Other Ambulatory Visit (INDEPENDENT_AMBULATORY_CARE_PROVIDER_SITE_OTHER): Payer: Medicare Other

## 2012-07-26 DIAGNOSIS — I10 Essential (primary) hypertension: Secondary | ICD-10-CM | POA: Diagnosis not present

## 2012-07-26 DIAGNOSIS — E7800 Pure hypercholesterolemia, unspecified: Secondary | ICD-10-CM | POA: Diagnosis not present

## 2012-07-26 LAB — COMPREHENSIVE METABOLIC PANEL
AST: 19 U/L (ref 0–37)
BUN: 10 mg/dL (ref 6–23)
Calcium: 9 mg/dL (ref 8.4–10.5)
Chloride: 107 mEq/L (ref 96–112)
Creatinine, Ser: 0.6 mg/dL (ref 0.4–1.2)
GFR: 114 mL/min (ref >60.00)
Total Bilirubin: 0.6 mg/dL (ref 0.3–1.2)

## 2012-07-26 LAB — LIPID PANEL
Cholesterol: 194 mg/dL (ref 0–200)
HDL: 49.5 mg/dL (ref >39.00)
LDL Cholesterol: 119 mg/dL — ABNORMAL HIGH (ref 0–99)
Total CHOL/HDL Ratio: 4
Triglycerides: 130 mg/dL (ref 0.0–149.0)

## 2012-07-27 ENCOUNTER — Other Ambulatory Visit: Payer: Medicare Other

## 2012-08-03 ENCOUNTER — Ambulatory Visit (INDEPENDENT_AMBULATORY_CARE_PROVIDER_SITE_OTHER): Payer: Medicare Other | Admitting: Family Medicine

## 2012-08-03 ENCOUNTER — Encounter: Payer: Self-pay | Admitting: Family Medicine

## 2012-08-03 VITALS — BP 134/82 | HR 82 | Temp 98.5°F | Wt 167.5 lb

## 2012-08-03 DIAGNOSIS — F0280 Dementia in other diseases classified elsewhere without behavioral disturbance: Secondary | ICD-10-CM | POA: Diagnosis not present

## 2012-08-03 DIAGNOSIS — E7800 Pure hypercholesterolemia, unspecified: Secondary | ICD-10-CM | POA: Diagnosis not present

## 2012-08-03 DIAGNOSIS — I10 Essential (primary) hypertension: Secondary | ICD-10-CM | POA: Diagnosis not present

## 2012-08-03 DIAGNOSIS — G309 Alzheimer's disease, unspecified: Secondary | ICD-10-CM | POA: Diagnosis not present

## 2012-08-03 NOTE — Assessment & Plan Note (Signed)
She cannot tolerate namenda, exerlon and aricept... Refuses any further treatment.

## 2012-08-03 NOTE — Patient Instructions (Addendum)
Remove throw rugs to avoid falls. Put hand bars in bathroom for support to avoid falls. Wear medic alert necklace. Follow up for medicare wellness in 6 months with labs prior.

## 2012-08-03 NOTE — Progress Notes (Signed)
79 year old female with HTN, high cholesterol, and dementia moderate presents for follow up.  Elevated Cholesterol: LDL back at goal < 130 on simvastatin 40 mg daily .  Lab Results  Component Value Date   CHOL 194 07/26/2012   HDL 49.50 07/26/2012   LDLCALC 119* 07/26/2012   LDLDIRECT 143.8 01/27/2012   TRIG 130.0 07/26/2012   CHOLHDL 4 07/26/2012  Using medications without problems:None  Muscle aches: None  Diet compliance: Moderate.  Exercise: waking frequently  Other complaints:   Hypertension: Well controlled on lisinopril and atenolol.  Using medication without problems or lightheadedness: None  Chest pain with exertion:None  Edema: None  Short of breath:None  Average home BPs: not checking  Other issues:  Vit D in nml range.   Dementia, moderate Alzheimer's : She refused aricept, namenda and  exelon patches,because it made her nauseated, diarrhea. She refuses to take these medications past she was flowed by Dr. Marjory Lies, neuro. Had follow up in 03/2011. He recommended follow up with PCP given she was not open to other meds.  Fell last night in the shower... She lost balance, feet slipped out from under her. Hit elbows, butt, no head injury. She denies pain now. No headache, no change in mental status.  Review of Systems  Constitutional: Negative for fever and fatigue.  HENT: Negative for ear pain.  Eyes: Negative for pain.  Respiratory: Negative for chest tightness and shortness of breath.  Cardiovascular: Negative for chest pain, palpitations and leg swelling.  Gastrointestinal: Negative for abdominal pain.  Genitourinary: Negative for dysuria.  Objective:   Physical Exam  Constitutional: Vital signs are normal. She appears well-developed and well-nourished. She is cooperative. Non-toxic appearance. She does not appear ill. No distress.  Elderly feamle in NAD  HENT:  Head: Normocephalic.  Right Ear: Hearing, tympanic membrane, external ear and ear canal normal.  Tympanic membrane is not erythematous, not retracted and not bulging.  Left Ear: Hearing, tympanic membrane, external ear and ear canal normal. Tympanic membrane is not erythematous, not retracted and not bulging.  Nose: No mucosal edema or rhinorrhea. Right sinus exhibits no maxillary sinus tenderness and no frontal sinus tenderness. Left sinus exhibits no maxillary sinus tenderness and no frontal sinus tenderness.  Mouth/Throat: Uvula is midline, oropharynx is clear and moist and mucous membranes are normal.  Eyes: Conjunctivae normal, EOM and lids are normal. Pupils are equal, round, and reactive to light. No foreign bodies found.  Neck: Trachea normal and normal range of motion. Neck supple. Carotid bruit is not present. No mass and no thyromegaly present.  Cardiovascular: Normal rate, regular rhythm, S1 normal, S2 normal, normal heart sounds, intact distal pulses and normal pulses. Exam reveals no gallop and no friction rub.  No murmur heard.  Pulmonary/Chest: Effort normal and breath sounds normal. Not tachypneic. No respiratory distress. She has no decreased breath sounds. She has no wheezes. She has no rhonchi. She has no rales.  Abdominal: Soft. Normal appearance and bowel sounds are normal. There is no tenderness.  Neurological: She is alert.  No pelvic pain to palpation, no bony pain. Skin: Skin is warm, dry and intact. No rash noted.  Brusing on elbows. NO head lacerations or bruising. Multiple SKs and skin tags.  Psychiatric: Her speech is normal and behavior is normal. Judgment and thought content normal. Her mood appears not anxious. Cognition and memory are declined. She does not exhibit a depressed mood.

## 2012-08-03 NOTE — Assessment & Plan Note (Signed)
Improved control on simvastatin.

## 2012-08-03 NOTE — Assessment & Plan Note (Signed)
Well controlled. Continue current medication.  

## 2012-10-26 ENCOUNTER — Other Ambulatory Visit: Payer: Self-pay | Admitting: Family Medicine

## 2012-12-17 ENCOUNTER — Ambulatory Visit: Payer: Medicare Other | Admitting: Oncology

## 2012-12-17 ENCOUNTER — Encounter: Payer: Self-pay | Admitting: Family

## 2012-12-17 ENCOUNTER — Encounter (INDEPENDENT_AMBULATORY_CARE_PROVIDER_SITE_OTHER): Payer: Self-pay

## 2012-12-17 ENCOUNTER — Ambulatory Visit (HOSPITAL_BASED_OUTPATIENT_CLINIC_OR_DEPARTMENT_OTHER): Payer: Medicare Other | Admitting: Family

## 2012-12-17 ENCOUNTER — Telehealth: Payer: Self-pay | Admitting: *Deleted

## 2012-12-17 ENCOUNTER — Other Ambulatory Visit (HOSPITAL_BASED_OUTPATIENT_CLINIC_OR_DEPARTMENT_OTHER): Payer: Medicare Other | Admitting: Lab

## 2012-12-17 VITALS — BP 177/90 | HR 109 | Temp 99.2°F | Resp 18 | Ht 65.0 in | Wt 176.5 lb

## 2012-12-17 DIAGNOSIS — R509 Fever, unspecified: Secondary | ICD-10-CM | POA: Diagnosis not present

## 2012-12-17 DIAGNOSIS — C50919 Malignant neoplasm of unspecified site of unspecified female breast: Secondary | ICD-10-CM

## 2012-12-17 DIAGNOSIS — Z853 Personal history of malignant neoplasm of breast: Secondary | ICD-10-CM

## 2012-12-17 DIAGNOSIS — D0590 Unspecified type of carcinoma in situ of unspecified breast: Secondary | ICD-10-CM | POA: Diagnosis not present

## 2012-12-17 DIAGNOSIS — D72829 Elevated white blood cell count, unspecified: Secondary | ICD-10-CM | POA: Diagnosis not present

## 2012-12-17 DIAGNOSIS — M899 Disorder of bone, unspecified: Secondary | ICD-10-CM | POA: Diagnosis not present

## 2012-12-17 LAB — CBC WITH DIFFERENTIAL/PLATELET
Basophils Absolute: 0.1 10*3/uL (ref 0.0–0.1)
Eosinophils Absolute: 0.1 10*3/uL (ref 0.0–0.5)
HCT: 39.4 % (ref 34.8–46.6)
HGB: 13.2 g/dL (ref 11.6–15.9)
MCV: 93.8 fL (ref 79.5–101.0)
NEUT#: 7.1 10*3/uL — ABNORMAL HIGH (ref 1.5–6.5)
NEUT%: 68.6 % (ref 38.4–76.8)
RDW: 13.2 % (ref 11.2–14.5)
lymph#: 2.4 10*3/uL (ref 0.9–3.3)

## 2012-12-17 LAB — COMPREHENSIVE METABOLIC PANEL (CC13)
ALT: 11 U/L (ref 0–55)
Alkaline Phosphatase: 72 U/L (ref 40–150)
Anion Gap: 10 mEq/L (ref 3–11)
BUN: 11.9 mg/dL (ref 7.0–26.0)
CO2: 26 mEq/L (ref 22–29)
Calcium: 9.4 mg/dL (ref 8.4–10.4)
Chloride: 104 mEq/L (ref 98–109)
Creatinine: 0.7 mg/dL (ref 0.6–1.1)
Glucose: 89 mg/dl (ref 70–140)
Total Protein: 6.8 g/dL (ref 6.4–8.3)

## 2012-12-17 MED ORDER — TAMOXIFEN CITRATE 20 MG PO TABS: 20.0000 mg | ORAL_TABLET | Freq: Every day | ORAL | Status: DC

## 2012-12-17 NOTE — Progress Notes (Addendum)
[facility]  Telephone:([phone]) [phone] Fax:([phone]) [phone]  OFFICE PROGRESS NOTE  ID: [name] [name]   DOB: 07-15-1933  MR#: [id number]  FAO#:[id number]   PCP: Kerby Nora, MD SU:    Ovidio Kin, MD RAD ONC:   Chipper Herb, MD    DIAGNOSIS: [name] [name] is a 79 y.o. female with a history of DCIS of left breast, status post lumpectomy in 07/2008.   PRIOR THERAPY: #1 Left breast needle localized lumpectomy on 07/17/2008 with the final pathology revealing an intermediate grade DCIS with necrosis and microcalcifications measuring 1.0 cm, estrogen receptor 100% positive, progesterone receptor negative.  #2 Radiation therapy to the left breast from 09/11/2008 until 10/09/2008.  #3 Started taking Tamoxifen 20 mg PO daily in 09/2008.   CURRENT THERAPY: Tamoxifen 20 mg PO daily since 09/2008.   INTERVAL HISTORY: [name] [name] is a 79 y.o. female returns for followup of left breast DCIS. She is accompanied for today's office visit by her daughter in law [name] [name].  Since her last office visit on 12/15/2011 the patient reports that she has been doing well.  Her interval history is otherwise unremarkable.   MEDICAL HISTORY: Past Medical History  Diagnosis Date  . Diverticulosis   . HTN (hypertension)   . Osteopenia   . Hyperlipidemia   . Breast cancer   . Alzheimer's dementia     ALLERGIES:   Allergies  Allergen Reactions  . Sulfonamide Derivatives     REACTION: rash  . Sulfur     hives    MEDICATIONS:  Current Outpatient Prescriptions  Medication Sig Dispense Refill  . atenolol (TENORMIN) 25 MG tablet TAKE ONE (1) TABLET BY MOUTH EVERY DAY  30 tablet  5  . Calcium Carbonate-Vitamin D (CALCIUM 600+D) 600-200 MG-UNIT TABS Take 1 tablet by mouth 2 (two) times daily.       Marland Kitchen lisinopril (PRINIVIL,ZESTRIL) 10 MG tablet Take 1 tablet (10 mg total) by mouth daily.  30 tablet  6  . simvastatin (ZOCOR) 40 MG tablet Take 40 mg by mouth daily.       . tamoxifen  (NOLVADEX) 20 MG tablet Take 1 tablet (20 mg total) by mouth daily.  30 tablet  12  . thiamine (VITAMIN B-1) 100 MG tablet Take 100 mg by mouth daily.       No current facility-administered medications for this visit.    SURGICAL HISTORY:  Past Surgical History  Procedure Laterality Date  . Breast lumpectomy  2007    left breast  . Right cataract extraction      REVIEW OF SYSTEMS:  Pertinent items are noted in HPI.  A 10 point review of systems was completed and is negative.  [name] [name] denies any symptomatology including fatigue, fever or chills, headache, vision changes, swollen glands, cough or shortness of breath, chest pain or discomfort, nausea, vomiting, diarrhea, constipation, change in urinary or bowel habits, arthralgias/myalgias, unusual bleeding/bruising or any other symptomatology.   PHYSICAL EXAMINATION: Blood pressure 177/90, pulse 109, temperature 99.2 F (37.3 C), temperature source Oral, resp. rate 18, height 5\' 5"  (1.651 m), weight 176 lb 8 oz (80.06 kg).  ECOG FS: 0 - Asymptomatic  General appearance: Alert, cooperative, well nourished, no apparent distress Head: Normocephalic, without obvious abnormality, atraumatic Eyes: Arcus senilis, PERRLA, EOMI Nose: Nares, septum and mucosa are normal, no drainage or sinus tenderness Neck: No adenopathy, supple, symmetrical, trachea midline, no tenderness Resp: Clear to auscultation bilaterally, no wheezes/rales/rhonchi Cardio: Regular rate and rhythm, S1, S2  normal, 1/6 murmur, no click, rub or gallop, no edema Breasts:  Pendulous bilaterally, left breast has well-healed surgical scars, left architectural radiation changes noted, glandular breast tissue bilaterally, firm inframammary and medial mammary ridge, bilateral axillary fullness GI: Soft, distended, non-tender, hypoactive bowel sounds, no organomegaly Skin: No rashes/lesions, skin warm and dry, no erythematous areas, no cyanosis, seborrheic keratoses on upper  extremities bilaterally and trunk areas  M/S:  Atraumatic, normal strength in all extremities, normal range of motion, no clubbing  Lymph nodes: Cervical, supraclavicular, and axillary nodes normal Neurologic: Grossly normal, cranial nerves II through XII intact, alert and oriented x 3, coarse bilateral upper extremity tremors Psych: Appropriate affect   LABORATORY DATA: Lab Results  Component Value Date   WBC 10.4* 12/17/2012   HGB 13.2 12/17/2012   HCT 39.4 12/17/2012   MCV 93.8 12/17/2012   PLT 238 12/17/2012      Chemistry      Component Value Date/Time   NA 140 12/17/2012 1001   NA 140 07/26/2012 0750   NA 141 12/16/2009 1052   K 4.0 12/17/2012 1001   K 3.9 07/26/2012 0750   K 4.4 12/16/2009 1052   CL 107 07/26/2012 0750   CL 104 12/15/2011 1132   CL 101 12/16/2009 1052   CO2 26 12/17/2012 1001   CO2 28 07/26/2012 0750   CO2 30 12/16/2009 1052   BUN 11.9 12/17/2012 1001   BUN 10 07/26/2012 0750   BUN 16 12/16/2009 1052   CREATININE 0.7 12/17/2012 1001   CREATININE 0.6 07/26/2012 0750   CREATININE 0.6 12/16/2009 1052      Component Value Date/Time   CALCIUM 9.4 12/17/2012 1001   CALCIUM 9.0 07/26/2012 0750   CALCIUM 9.4 12/16/2009 1052   ALKPHOS 72 12/17/2012 1001   ALKPHOS 48 07/26/2012 0750   ALKPHOS 48 12/16/2009 1052   AST 15 12/17/2012 1001   AST 19 07/26/2012 0750   AST 18 12/16/2009 1052   ALT 11 12/17/2012 1001   ALT 15 07/26/2012 0750   ALT 19 12/16/2009 1052   BILITOT 0.36 12/17/2012 1001   BILITOT 0.6 07/26/2012 0750   BILITOT 0.50 12/16/2009 1052       RADIOGRAPHIC STUDIES: 1.  Mr Brain Wo Contrast 10/22/2011   *RADIOLOGY REPORT*  Clinical Data: 79 year old female with dementia.  History of breast cancer 3 years ago.  MRI HEAD WITHOUT CONTRAST  Technique:  Multiplanar, multiecho pulse sequences of the brain and surrounding structures were obtained according to standard protocol without intravenous contrast.  Comparison: CT head without contrast 03/10/2006.  Findings: No  restricted diffusion to suggest acute infarction.  No midline shift, mass effect, evidence of mass lesion, ventriculomegaly, extra-axial collection or acute intracranial hemorrhage.  Cervicomedullary junction and pituitary are within normal limits.  There is cerebral volume loss, that this appears generalized with no regional disproportionate involvement evident.  Major intracranial vascular flow voids are preserved, dominant distal left vertebral artery.  Pneumatized left anterior clinoid process.  No contrast administered.  Scattered small foci of T2 and FLAIR hyperintensity in the cerebral white matter.  Deep gray matter nuclei, brainstem and cerebellum are within normal limits. Volume loss but no signal changes identified in the mesial temporal lobes.  Negative visualized cervical spine.  Normal marrow signal. Stable benign small exostosis off the left frontal bone.  Postoperative changes to the globes. Visualized paranasal sinuses and mastoids are clear.  Stable scalp soft tissues.  IMPRESSION: 1. No acute intracranial abnormality. Note that early metastatic disease to  the brain cannot be excluded in the absence of intravenous contrast. 2.  Cerebral volume loss, but appears generalized without a pattern of regional disproportionate involvement identified. 3.  Nonspecific cerebral white matter signal changes, mild for age and compatible with chronic small vessel disease.   Original Report Authenticated By: Ulla Potash III, M.D.   2.  Bone density scan on 06/11/2012 showed a T score of -1.9 (osteopenia).  3.  3-D bilateral digital diagnostic mammogram in 06/11/2012 showed there is scattered fibroglandular tissue (approximately 25% - 50% glandular).  Stable appearance of lumpectomy site in the posterior upper outer left breast without suspicious microcalcifications or developing density or mass noted.  No suspicious findings in the right breast.  Benign findings    ASSESSMENT: MAYUMI SUMMERSON is a 79 y.o.  female with:  #1 Left breast needle localized lumpectomy on 07/17/2008 with the final pathology revealing an intermediate grade, DCIS with necrosis and microcalcifications, measuring 1.0 cm, estrogen receptor 100% positive, progesterone receptor negative.  #2 Radiation therapy to the left breast from 09/11/2008 until 10/09/2008.  #3 Started taking Tamoxifen 20 mg PO daily in 09/2008.  #4 Osteopenia  #5 Low-grade fever/elevated WBCs     PLAN:  #1 Continue Tamoxifen 20 mg daily.  An electronic prescription for Tamoxifen #30 with 12 refills was sent to the patient's pharmacy.  The patient will have 5 years of antiestrogen therapy completed in 09/2013, and we discussed discontinuing Tamoxifen at that time.  She agreed to do so.  We will schedule her next bilateral digital diagnostic mammogram due in 05/2013 for her.  #2  For osteopenia, the patient will continue taking calcium with vitamin D by mouth twice a day.  Her next bone density scan is due in 05/2014.  #3  Mrs. Emmons may have a low-grade fever and elevated WBCs secondary to fall allergies.  She denies any symptomatology related to fever including chills, runny nose, sinus congestion, cough, nausea, vomiting or diarrhea.  Ms. Firkus was asked to take Loratadine 10 mg by mouth daily for the next 3-5 days and Tylenol daily for the next 3-5 days for fever (and to check temperature daily).  She was asked to call her primary care physician immediately if her temperature is greater than 100F and/or if she becomes symptomatic.  #3  We plan to see Mrs. Shampine again in one year at which time we will check laboratories of CBC and CMP.  She was asked to discontinue taking Tamoxifen on 10/14/2013.   All questions were answered.  Mrs. Roadcap and her daughter-in-law [name] [name] were was encouraged to contact us in the interim with any questions, concerns, or problems.   Larina Bras, NP-C 12/17/2012   4:03 PM  ATTENDING'S ATTESTATION:  I  personally reviewed patient's chart, examined patient myself, formulated the treatment plan as followed.    Overall Ms. Mccowan is doing well. She has been tolerating tamoxifen very nicely. She is denying any aches pains may be some hot flashes. Plan is to continue to have her take tamoxifen until August 2015. This will complete her 5 years of therapy.  Drue Second, MD Medical/Oncology [facility] [phone] (beeper) [phone] (Office)  12/21/2012, 1:15 PM

## 2012-12-17 NOTE — Patient Instructions (Addendum)
Please contact us at [phone] if you have any questions or concerns.  Please continue to do well and enjoy life!!!  Get plenty of rest, drink plenty of water, exercise daily, eat a balanced diet.  Continue to take calcium with vitamin D  - 1 tablet at breakfast and 1 tablet at supper.  Complete monthly self-breast examinations.  Have a clinical breast exam by a physician every year.  Have your mammogram completed every year.  Loratidine 10 mg by mouth daily for 3 -5 days along with a daily Tylenol for 3-5 days also.  Take temperature daily before taking Tylenol.  If temperature is above 100 degrees and/or you become symptomatic, contact Dr. Ermalene Searing immediately.  Stop taking Tamoxifen October 14, 2013.      Results for orders placed in visit on 12/17/12 (from the past 24 hour(s))  CBC WITH DIFFERENTIAL     Status: Abnormal   Collection Time    12/17/12 10:01 AM      Result Value Range   WBC 10.4 (*) 3.9 - 10.3 10e3/uL   NEUT# 7.1 (*) 1.5 - 6.5 10e3/uL   HGB 13.2  11.6 - 15.9 g/dL   HCT 16.1  09.6 - 04.5 %   Platelets 238  145 - 400 10e3/uL   MCV 93.8  79.5 - 101.0 fL   MCH 31.4  25.1 - 34.0 pg   MCHC 33.4  31.5 - 36.0 g/dL   RBC 4.09  8.11 - 9.14 10e6/uL   RDW 13.2  11.2 - 14.5 %   lymph# 2.4  0.9 - 3.3 10e3/uL   MONO# 0.7  0.1 - 0.9 10e3/uL   Eosinophils Absolute 0.1  0.0 - 0.5 10e3/uL   Basophils Absolute 0.1  0.0 - 0.1 10e3/uL   NEUT% 68.6  38.4 - 76.8 %   LYMPH% 22.7  14.0 - 49.7 %   MONO% 6.5  0.0 - 14.0 %   EOS% 1.3  0.0 - 7.0 %   BASO% 0.9  0.0 - 2.0 %   Narrative:    Performed At:  [facility]               [address]               [place], [place] [address]  COMPREHENSIVE METABOLIC PANEL (CC13)     Status: Abnormal   Collection Time    12/17/12 10:01 AM      Result Value Range   Sodium 140  136 - 145 mEq/L   Potassium 4.0  3.5 - 5.1 mEq/L   Chloride 104  98 - 109 mEq/L   CO2 26  22 - 29 mEq/L   Glucose 89  70 - 140 mg/dl   BUN 62.1  7.0  - 30.8 mg/dL   Creatinine 0.7  0.6 - 1.1 mg/dL   Total Bilirubin 6.57  0.20 - 1.20 mg/dL   Alkaline Phosphatase 72  40 - 150 U/L   AST 15  5 - 34 U/L   ALT 11  0 - 55 U/L   Total Protein 6.8  6.4 - 8.3 g/dL   Albumin 3.4 (*) 3.5 - 5.0 g/dL   Calcium 9.4  8.4 - 84.6 mg/dL   Anion Gap 10  3 - 11 mEq/L   Narrative:    Performed At:  [facility]               [address]  [place], [place] [address]

## 2012-12-17 NOTE — Telephone Encounter (Signed)
appts made and printed. gv appt for [facility]...td 

## 2013-02-05 ENCOUNTER — Other Ambulatory Visit (INDEPENDENT_AMBULATORY_CARE_PROVIDER_SITE_OTHER): Payer: Medicare Other

## 2013-02-05 ENCOUNTER — Other Ambulatory Visit: Payer: Self-pay | Admitting: Family Medicine

## 2013-02-05 ENCOUNTER — Telehealth: Payer: Self-pay | Admitting: Family Medicine

## 2013-02-05 DIAGNOSIS — I10 Essential (primary) hypertension: Secondary | ICD-10-CM | POA: Diagnosis not present

## 2013-02-05 DIAGNOSIS — M899 Disorder of bone, unspecified: Secondary | ICD-10-CM

## 2013-02-05 DIAGNOSIS — E7800 Pure hypercholesterolemia, unspecified: Secondary | ICD-10-CM

## 2013-02-05 DIAGNOSIS — E119 Type 2 diabetes mellitus without complications: Secondary | ICD-10-CM | POA: Diagnosis not present

## 2013-02-05 LAB — LIPID PANEL
Cholesterol: 212 mg/dL — ABNORMAL HIGH (ref 0–200)
HDL: 58 mg/dL (ref >39)
LDL Cholesterol: 128 mg/dL — ABNORMAL HIGH (ref 0–99)
Total CHOL/HDL Ratio: 3.7 Ratio
Triglycerides: 130 mg/dL (ref <150)
VLDL: 26 mg/dL (ref 0–40)

## 2013-02-05 LAB — COMPREHENSIVE METABOLIC PANEL
ALT: 9 U/L (ref 0–35)
AST: 13 U/L (ref 0–37)
Albumin: 3.8 g/dL (ref 3.5–5.2)
CO2: 26 mEq/L (ref 19–32)
Chloride: 102 mEq/L (ref 96–112)
Creat: 0.64 mg/dL (ref 0.50–1.10)
Glucose, Bld: 88 mg/dL (ref 70–99)
Potassium: 4 mEq/L (ref 3.5–5.3)
Sodium: 138 mEq/L (ref 135–145)
Total Bilirubin: 0.3 mg/dL (ref 0.3–1.2)
Total Protein: 6.7 g/dL (ref 6.0–8.3)

## 2013-02-05 LAB — CBC WITH DIFFERENTIAL/PLATELET
Basophils Absolute: 0 10*3/uL (ref 0.0–0.1)
Basophils Relative: 1 % (ref 0–1)
Eosinophils Absolute: 0.1 10*3/uL (ref 0.0–0.7)
MCH: 32 pg (ref 26.0–34.0)
MCHC: 33.7 g/dL (ref 30.0–36.0)
Monocytes Absolute: 0.6 10*3/uL (ref 0.1–1.0)
Neutrophils Relative %: 63 % (ref 43–77)
Platelets: 257 10*3/uL (ref 150–400)
RBC: 4.35 MIL/uL (ref 3.87–5.11)
RDW: 13.9 % (ref 11.5–15.5)

## 2013-02-05 NOTE — Telephone Encounter (Signed)
Message copied by Excell Seltzer on Tue Feb 05, 2013  3:31 PM ------      Message from: Baldomero Lamy      Created: Tue Feb 05, 2013  9:36 AM      Regarding: pt had labs today, need orders.       Pt came in for labs today, need orders asap.      Thanks      Tasha ------

## 2013-02-06 LAB — VITAMIN D 25 HYDROXY (VIT D DEFICIENCY, FRACTURES): Vit D, 25-Hydroxy: 25 ng/mL — ABNORMAL LOW (ref 30–89)

## 2013-02-06 LAB — HEMOGLOBIN A1C: Mean Plasma Glucose: 120 mg/dL — ABNORMAL HIGH (ref <117)

## 2013-02-08 ENCOUNTER — Encounter: Payer: Medicare Other | Admitting: Family Medicine

## 2013-02-13 ENCOUNTER — Encounter: Payer: Self-pay | Admitting: Family Medicine

## 2013-02-13 ENCOUNTER — Ambulatory Visit (INDEPENDENT_AMBULATORY_CARE_PROVIDER_SITE_OTHER): Payer: Medicare Other | Admitting: Family Medicine

## 2013-02-13 VITALS — BP 130/80 | HR 72 | Temp 98.1°F | Ht 65.0 in | Wt 174.2 lb

## 2013-02-13 DIAGNOSIS — E559 Vitamin D deficiency, unspecified: Secondary | ICD-10-CM | POA: Diagnosis not present

## 2013-02-13 DIAGNOSIS — F0280 Dementia in other diseases classified elsewhere without behavioral disturbance: Secondary | ICD-10-CM | POA: Diagnosis not present

## 2013-02-13 DIAGNOSIS — Z0000 Encounter for general adult medical examination without abnormal findings: Secondary | ICD-10-CM | POA: Diagnosis not present

## 2013-02-13 DIAGNOSIS — I10 Essential (primary) hypertension: Secondary | ICD-10-CM

## 2013-02-13 DIAGNOSIS — E7800 Pure hypercholesterolemia, unspecified: Secondary | ICD-10-CM

## 2013-02-13 MED ORDER — GALANTAMINE HYDROBROMIDE 4 MG PO TABS: 4.0000 mg | ORAL_TABLET | Freq: Two times a day (BID) | ORAL | Status: DC

## 2013-02-13 NOTE — Patient Instructions (Addendum)
Start vit D 3 400 IU twice daily. Work on decreasing sweets and eat healthy foods ( low fat, low carb). Increase exercise as able. Start galantamine low dose.  Follow up on memory in 1-2 months.. Call if galantamine not tolerated.

## 2013-02-13 NOTE — Assessment & Plan Note (Signed)
Replete

## 2013-02-13 NOTE — Assessment & Plan Note (Signed)
Well controlled. Continue current medication.  

## 2013-02-13 NOTE — Assessment & Plan Note (Signed)
LDL at goal < 130. On current meds.

## 2013-02-13 NOTE — Assessment & Plan Note (Addendum)
  Aricept, namenda and exelon patches made her nauseated, diarrhea. She has had progression of disease with decline of MMSE. She is open to trial of galantamine low dose.  Follow up in 1 month.

## 2013-02-13 NOTE — Progress Notes (Signed)
Pre-visit discussion using our clinic review tool. No additional management support is needed unless otherwise documented below in the visit note.  

## 2013-02-13 NOTE — Progress Notes (Signed)
HPI  I have personally reviewed the Medicare Annual Wellness questionnaire and have noted  1. The patient's medical and social history  2. Their use of alcohol, tobacco or illicit drugs  3. Their current medications and supplements  4. The patient's functional ability including ADL's, fall risks, home safety risks and hearing or visual  impairment.  5. Diet and physical activities  6. Evidence for depression or mood disorders  The patients weight, height, BMI and visual acuity have been recorded in the chart  I have made referrals, counseling and provided education to the patient based review of the above and I have provided the pt with a written personalized care plan for preventive services.   Alzheimer's dementia, moderate: per husband today memory is not any better,slightly worse. 09/09/2010 MMSE 26-27/30  10/14/2011 MMSE 20/30  TODAY MMSE 18/30 ( most significantly could not remember date,unable to perform writing and drawing due to tremor, she was able to do these in past) She refused aricept, namenda and exelon patches,because it made her nauseated, diarrhea. She refuses to take these medications past she was flowed by Dr. Marjory Lies, neuro. Had follow up in 03/2011. He recommended follow up with PCP given she was not open to other meds. Pt does not have significant concerns. Her mother did have alzheimer's dz.  No suggestion of depression.   Hypertension: Well controlled on lisinopril  BP Readings from Last 3 Encounters:  02/13/13 130/80  12/17/12 177/90  08/03/12 134/82  Using medication without problems or lightheadedness: None  Chest pain with exertion:None  Edema:None  Short of breath:None  Average home BPs: not checking  Other issues:   Elevated Cholesterol: LDL at goal < 130 on zocor 40 mg daily  Lab Results  Component Value Date   CHOL 212* 02/05/2013   HDL 58 02/05/2013   LDLCALC 128* 02/05/2013   LDLDIRECT 143.8 01/27/2012   TRIG 130 02/05/2013   CHOLHDL 3.7  02/05/2013  Using medications without problems:  none Muscle aches: None Diet compliance: healthy, eats a lot of sweets.  Exercise: walks some Other complaints:  Wt Readings from Last 3 Encounters:  02/13/13 174 lb 4 oz (79.039 kg)  12/17/12 176 lb 8 oz (80.06 kg)  08/03/12 167 lb 8 oz (75.978 kg)     Review of Systems  Constitutional: Negative for fever, fatigue and unexpected weight change.  HENT: Negative for ear pain, congestion, sore throat, sneezing, trouble swallowing and sinus pressure.  Eyes: Negative for pain and itching.  Respiratory: Negative for cough, shortness of breath and wheezing.  Cardiovascular: Negative for chest pain, palpitations and leg swelling.  Gastrointestinal: Negative for nausea, abdominal pain, diarrhea, constipation and blood in stool.  Genitourinary: Negative for dysuria, hematuria, vaginal discharge and difficulty urinating.  Skin: Negative for rash.  Neurological: No numbness. Negative for syncope, weakness, light-headedness and headaches.  Psychiatric/Behavioral: Positive for confusion. Negative for dysphoric mood. The patient is not nervous/anxious.  Objective:   Physical Exam  Constitutional: Vital signs are normal. She appears well-developed and well-nourished. She is cooperative. Non-toxic appearance. She does not appear ill. No distress.  HENT:  Head: Normocephalic.  Right Ear: Hearing, tympanic membrane, external ear and ear canal normal.  Left Ear: Hearing, tympanic membrane, external ear and ear canal normal.  Nose: Nose normal.  Eyes: Conjunctivae normal, EOM and lids are normal. Pupils are equal, round, and reactive to light. No foreign bodies found.  Neck: Trachea normal and normal range of motion. Neck supple. Carotid bruit is not present. No  mass and no thyromegaly present.  Cardiovascular: Normal rate, regular rhythm, S1 normal, S2 normal, normal heart sounds and intact distal pulses. Exam reveals no gallop and no friction rub.  No  murmur heard.  Pulmonary/Chest: Effort normal and breath sounds normal. No respiratory distress. She has no wheezes. She has no rhonchi. She has no rales. She exhibits no tenderness.  Abdominal: Soft. Normal appearance and bowel sounds are normal. She exhibits no distension, no fluid wave, no abdominal bruit and no mass. There is no hepatosplenomegaly. There is no tenderness. There is no rebound, no guarding and no CVA tenderness. No hernia.  Lymphadenopathy:  She has no cervical adenopathy.  She has no axillary adenopathy.  Neurological: She is alert. She has normal strength. No cranial nerve deficit or sensory deficit. Coordination normal.  Skin: Skin is warm, dry and intact. No rash noted.  Psychiatric: She has a normal mood and affect. Her speech is normal and behavior is normal. Judgment normal. Her mood appears not anxious. She does not exhibit a depressed mood. She exhibits abnormal recent memory.  Assessment & Plan:   The patient's preventative maintenance and recommended screening tests for an annual wellness exam were reviewed in full today.  Brought up to date unless services declined.  Counselled on the importance of diet, exercise, and its role in overall health and mortality.  The patient's FH and SH was reviewed, including their home life, tobacco status, and drug and alcohol status.   Vaccines:uptodate PNA and td, refused shingles, flu DVE/pap: not indicated  DEXA: 06/2012 osteopenia stable from last check Mammo: hx of breast cancer, 05/2012 mammo stable, followed by Dr. Welton Flakes  Colon: 2013 sessile polyp Dr. Russella Dar, no further indicated due to age. Former smoker, minimal use.

## 2013-04-22 ENCOUNTER — Other Ambulatory Visit: Payer: Self-pay | Admitting: Family Medicine

## 2013-05-01 DIAGNOSIS — H52209 Unspecified astigmatism, unspecified eye: Secondary | ICD-10-CM | POA: Diagnosis not present

## 2013-05-01 DIAGNOSIS — H2640 Unspecified secondary cataract: Secondary | ICD-10-CM | POA: Diagnosis not present

## 2013-05-23 DIAGNOSIS — H26499 Other secondary cataract, unspecified eye: Secondary | ICD-10-CM | POA: Diagnosis not present

## 2013-05-23 DIAGNOSIS — H2640 Unspecified secondary cataract: Secondary | ICD-10-CM | POA: Diagnosis not present

## 2013-06-12 DIAGNOSIS — Z853 Personal history of malignant neoplasm of breast: Secondary | ICD-10-CM | POA: Diagnosis not present

## 2013-06-12 DIAGNOSIS — R928 Other abnormal and inconclusive findings on diagnostic imaging of breast: Secondary | ICD-10-CM | POA: Diagnosis not present

## 2013-06-17 ENCOUNTER — Encounter: Payer: Self-pay | Admitting: Family Medicine

## 2013-06-17 ENCOUNTER — Ambulatory Visit (INDEPENDENT_AMBULATORY_CARE_PROVIDER_SITE_OTHER): Payer: Medicare Other | Admitting: Family Medicine

## 2013-06-17 ENCOUNTER — Ambulatory Visit (INDEPENDENT_AMBULATORY_CARE_PROVIDER_SITE_OTHER)
Admission: RE | Admit: 2013-06-17 | Discharge: 2013-06-17 | Disposition: A | Discharge status: Home / Self Care | Payer: Medicare Other | Source: Ambulatory Visit | Attending: Family Medicine | Admitting: Family Medicine

## 2013-06-17 VITALS — BP 130/76 | HR 95 | Temp 97.3°F | Wt 190.5 lb

## 2013-06-17 DIAGNOSIS — M25562 Pain in left knee: Secondary | ICD-10-CM

## 2013-06-17 DIAGNOSIS — IMO0002 Reserved for concepts with insufficient information to code with codable children: Secondary | ICD-10-CM | POA: Diagnosis not present

## 2013-06-17 DIAGNOSIS — G609 Hereditary and idiopathic neuropathy, unspecified: Secondary | ICD-10-CM | POA: Diagnosis not present

## 2013-06-17 DIAGNOSIS — M1710 Unilateral primary osteoarthritis, unspecified knee: Secondary | ICD-10-CM | POA: Diagnosis not present

## 2013-06-17 DIAGNOSIS — M25569 Pain in unspecified knee: Secondary | ICD-10-CM | POA: Diagnosis not present

## 2013-06-17 NOTE — Progress Notes (Signed)
[place] [address] Phone: [phone] Fax: [phone]  Patient ID: [name] [name] MRN: [id number], DOB: May 16, 1933, 80 y.o. Date of Encounter: 06/17/2013  Primary Physician:  Eliezer Lofts, MD   Chief Complaint: Knee Pain   Subjective:   History of Present Illness:  [name] [name] is a 80 y.o. very pleasant female patient who presents with the following:  The patient is an elderly female with moderate degree of Alzheimer's disease who present after falling a few days ago and striking both of her knees. Now she has some swelling and significant pain in her LEFT knee. She is also here with her husband and her daughter who help her by history. It has been improving over the last couple of days and they have basically just her leg elevated and done some icing.  Past Medical History, Surgical History, Social History, Family History, Problem List, Medications, and Allergies have been reviewed and updated if relevant.  Review of Systems:  GEN: No fevers, chills. Nontoxic. Primarily MSK c/o today. MSK: Detailed in the HPI GI: tolerating PO intake without difficulty Neuro: No numbness, parasthesias, or tingling associated. Otherwise the pertinent positives of the ROS are noted above.   Objective:   Physical Examination: BP 130/76  Pulse 95  Temp(Src) 97.3 F (36.3 C) (Oral)  Wt 190 lb 8 oz (86.41 kg)  SpO2 94%   GEN: WDWN, NAD, Non-toxic, Alert & Oriented x 3 HEENT: Atraumatic, Normocephalic.  Ears and Nose: No external deformity. EXTR: No clubbing/cyanosis/edema NEURO: markedly antalgic PSYCH: Normally interactive. Conversant. Not depressed or anxious appearing.  Calm demeanor.    LEFT knee tone the patient lacks 3 of extension. She can bend her knee to 100. Does have some mild tenderness at the tibial tubercle and the distal patellar tendon. No tenderness at the anserine bursa. Mild to moderate tenderness at both the medial and lateral joint lines. She  does have significant patellar crepitus. Stable to varus and valgus stress. Anterior and posterior drawer testing is negative. She does have some pain with McMurray's testing. Bounce home testing is negative.  Radiology: Dg Knee Complete 4 Views Left  06/17/2013   CLINICAL DATA:  Pain.  EXAM: LEFT KNEE - COMPLETE 4+ VIEW  COMPARISON:  None.  FINDINGS: Severe tricompartment degenerative change present with severe loss of joint space about the medial compartment. No evidence of fracture or dislocation. Knee joint effusion cannot be excluded.  IMPRESSION: Severe tricompartment degenerative change.   Electronically Signed   By: Marcello Moores  Register   On: 06/17/2013 16:17    Assessment & Plan:   Left knee pain - Plan: DG Knee Complete 4 Views Left, DME Other see comment  Unspecified hereditary and idiopathic peripheral neuropathy - Plan: DME Other see comment  >25 minutes spent in face to face time with patient, >50% spent in counselling or coordination of care: additional time spent in going over knee anatomy, and explaining arthritis to all of the family members that were there. Certainly trauma can induce an arthritis exacerbation.  The patient does have essentially end-stage degenerative joint disease of the LEFT knee. There is no occult fracture. Given her other comorbidities, I'm going to give the patient a walker, have her use of Tylenol, and ice her knee intermittently over the next few weeks.  I recommended to the family she is not improved or improving in the next 3-4 weeks to have her reevaluated. I would be happy to see her.  The written order was  given for a standard walker.Length of need 99 years. Dispense one.  Follow-up: No Follow-up on file. Unless noted above, the patient is to follow-up if symptoms worsen. Red flags were reviewed with the patient.  New Prescriptions   No medications on file   Orders Placed This Encounter  Procedures  . DME Other see comment  . DG Knee Complete  4 Views Left    Signed,  Sharlee Rufino T. Misti Towle, MD, [place]  Patient's Medications  New Prescriptions   No medications on file  Previous Medications   ATENOLOL (TENORMIN) 25 MG TABLET    TAKE ONE (1) TABLET BY MOUTH EVERY DAY   CALCIUM CARBONATE-VITAMIN D (CALCIUM 600+D) 600-200 MG-UNIT TABS    Take 1 tablet by mouth 2 (two) times daily.    GALANTAMINE (RAZADYNE) 4 MG TABLET    TAKE 1 TABLET BY MOUTH TWICE A DAY WITH A MEAL   LISINOPRIL (PRINIVIL,ZESTRIL) 10 MG TABLET    Take 1 tablet (10 mg total) by mouth daily.   SIMVASTATIN (ZOCOR) 40 MG TABLET    Take 40 mg by mouth daily.    TAMOXIFEN (NOLVADEX) 20 MG TABLET    Take 1 tablet (20 mg total) by mouth daily.   THIAMINE (VITAMIN B-1) 100 MG TABLET    Take 100 mg by mouth daily.  Modified Medications   No medications on file  Discontinued Medications   No medications on file

## 2013-06-17 NOTE — Progress Notes (Signed)
Pre visit review using our clinic review tool, if applicable. No additional management support is needed unless otherwise documented below in the visit note. 

## 2013-07-23 ENCOUNTER — Encounter: Payer: Self-pay | Admitting: Family Medicine

## 2013-07-23 ENCOUNTER — Ambulatory Visit (INDEPENDENT_AMBULATORY_CARE_PROVIDER_SITE_OTHER): Payer: Medicare Other | Admitting: Family Medicine

## 2013-07-23 VITALS — BP 140/70 | HR 100 | Temp 98.4°F | Ht 65.0 in | Wt 191.5 lb

## 2013-07-23 DIAGNOSIS — L239 Allergic contact dermatitis, unspecified cause: Secondary | ICD-10-CM

## 2013-07-23 DIAGNOSIS — G309 Alzheimer's disease, unspecified: Secondary | ICD-10-CM

## 2013-07-23 DIAGNOSIS — F0280 Dementia in other diseases classified elsewhere without behavioral disturbance: Secondary | ICD-10-CM | POA: Diagnosis not present

## 2013-07-23 DIAGNOSIS — L259 Unspecified contact dermatitis, unspecified cause: Secondary | ICD-10-CM | POA: Diagnosis not present

## 2013-07-23 MED ORDER — TRIAMCINOLONE ACETONIDE 0.1 % EX CREA: 1.0000 "application " | TOPICAL_CREAM | Freq: Two times a day (BID) | CUTANEOUS | Status: DC

## 2013-07-23 NOTE — Progress Notes (Signed)
[place] [address] Phone: [phone] Fax: [phone]  Patient ID: [name] [name] MRN: [id number], DOB: [birth year], 80 y.o. Date of Encounter: 07/23/2013  Primary Physician:  Eliezer Lofts, MD   Chief Complaint: Rash   Subjective:   History of Present Illness:  [name] [name] is a 80 y.o. very pleasant female patient who presents with the following:  Anterior leg, itchy. Has been spreading.   Placed some anti-itch cream on it.  Greater than 50% of the history is obtained through the patient's husband.  She has no trauma or accident. No known or new exposures. He is quite itchy. It has been pruritic. Not really known what to put on it. No fever, chills, or systemic symptoms.  Past Medical History, Surgical History, Social History, Family History, Problem List, Medications, and Allergies have been reviewed and updated if relevant.  Review of Systems:  GEN: No acute illnesses, no fevers, chills. GI: No n/v/d, eating normally Pulm: No SOB Interactive and getting along well at home.  Otherwise, ROS is as per the HPI.  Objective:   Physical Examination: BP 140/70  Pulse 100  Temp(Src) 98.4 F (36.9 C) (Oral)  Ht '5\' 5"'  (1.651 m)  Wt 191 lb 8 oz (86.864 kg)  BMI 31.87 kg/m2   GEN: WDWN, NAD, Non-toxic, A & O x 3 HEENT: Atraumatic, Normocephalic. Neck supple. No masses, No LAD. Ears and Nose: No external deformity. EXTR: No c/c/e NEURO Normal gait.  PSYCH: Normally interactive. Conversant. Not depressed or anxious appearing.  Calm demeanor.   Right anterior shin going down to more distally there is some scattered reddish mildly elevated lesions without true scale with some evidence of minor excoriation. There is no fluctuance, warmth, or dramatic redness in appearance.  Laboratory and Imaging Data: Lipids:    Component Value Date/Time   CHOL 212* 02/05/2013 1606   TRIG 130 02/05/2013 1606   HDL 58 02/05/2013 1606   LDLDIRECT 143.8 01/27/2012  1409   VLDL 26 02/05/2013 1606   CHOLHDL 3.7 02/05/2013 1606   CBC: CBC Latest Ref Rng 02/05/2013 12/17/2012 12/15/2011  WBC 4.0 - 10.5 K/uL 8.3 10.4(H) 8.3  Hemoglobin 12.0 - 15.0 g/dL 13.9 13.2 13.0  Hematocrit 36.0 - 46.0 % 41.2 39.4 38.1  Platelets 150 - 400 K/uL 257 238 889    Basic Metabolic Panel:    Component Value Date/Time   NA 138 02/05/2013 1606   NA 140 12/17/2012 1001   NA 141 12/16/2009 1052   K 4.0 02/05/2013 1606   K 4.0 12/17/2012 1001   K 4.4 12/16/2009 1052   CL 102 02/05/2013 1606   CL 104 12/15/2011 1132   CL 101 12/16/2009 1052   CO2 26 02/05/2013 1606   CO2 26 12/17/2012 1001   CO2 30 12/16/2009 1052   BUN 16 02/05/2013 1606   BUN 11.9 12/17/2012 1001   BUN 16 12/16/2009 1052   CREATININE 0.64 02/05/2013 1606   CREATININE 0.7 12/17/2012 1001   CREATININE 0.6 07/26/2012 0750   GLUCOSE 88 02/05/2013 1606   GLUCOSE 89 12/17/2012 1001   GLUCOSE 125* 12/15/2011 1132   GLUCOSE 103 12/16/2009 1052   CALCIUM 9.0 02/05/2013 1606   CALCIUM 9.4 12/17/2012 1001   CALCIUM 9.4 12/16/2009 1052   Hepatic Function Latest Ref Rng 02/05/2013 12/17/2012 07/26/2012  Total Protein 6.0 - 8.3 g/dL 6.7 6.8 6.5  Albumin 3.5 - 5.2 g/dL 3.8 3.4(L) 3.6  AST 0 - 37 U/L 13 15 19  ALT 0 - 35 U/L '9 11 15  ' Alk Phosphatase 39 - 117 U/L 70 72 48  Total Bilirubin 0.3 - 1.2 mg/dL 0.3 0.36 0.6  Bilirubin, Direct 0.0-0.3 mg/dL - - -    Lab Results  Component Value Date   TSH 2.33 05/16/2011    Assessment & Plan:   Allergic dermatitis  Alzheimer's dementia, moderate, MMSE 18/30 on 02/13/2013  Supportive care with cream. Red flags discussed including worsening redness, heat, fever, or potential infection secondary to scratching.  New Prescriptions   TRIAMCINOLONE CREAM (KENALOG) 0.1 %    Apply 1 application topically 2 (two) times daily.   Modified Medications   No medications on file   No orders of the defined types were placed in this encounter.   Follow-up: No Follow-up on  file. Unless noted above, the patient is to follow-up if symptoms worsen. Red flags were reviewed with the patient.  Signed,  Maud Deed. Malaijah Houchen, MD, [facility]   Discontinued Medications   No medications on file   Current Medications at Discharge:   Medication List       This list is accurate as of: 07/23/13 12:59 PM.  Always use your most recent med list.               atenolol 25 MG tablet  Commonly known as:  TENORMIN  TAKE ONE (1) TABLET BY MOUTH EVERY DAY     CALCIUM 600+D 600-200 MG-UNIT Tabs  Generic drug:  Calcium Carbonate-Vitamin D  Take 1 tablet by mouth 2 (two) times daily.     galantamine 4 MG tablet  Commonly known as:  RAZADYNE  TAKE 1 TABLET BY MOUTH TWICE A DAY WITH A MEAL     lisinopril 10 MG tablet  Commonly known as:  PRINIVIL,ZESTRIL  Take 1 tablet (10 mg total) by mouth daily.     simvastatin 40 MG tablet  Commonly known as:  ZOCOR  Take 40 mg by mouth daily.     tamoxifen 20 MG tablet  Commonly known as:  NOLVADEX  Take 1 tablet (20 mg total) by mouth daily.     thiamine 100 MG tablet  Commonly known as:  VITAMIN B-1  Take 100 mg by mouth daily.     triamcinolone cream 0.1 %  Commonly known as:  KENALOG  Apply 1 application topically 2 (two) times daily.

## 2013-07-23 NOTE — Progress Notes (Signed)
Pre visit review using our clinic review tool, if applicable. No additional management support is needed unless otherwise documented below in the visit note. 

## 2013-07-30 ENCOUNTER — Ambulatory Visit (INDEPENDENT_AMBULATORY_CARE_PROVIDER_SITE_OTHER): Payer: Medicare Other | Admitting: Family Medicine

## 2013-07-30 ENCOUNTER — Encounter: Payer: Self-pay | Admitting: Family Medicine

## 2013-07-30 VITALS — BP 142/92 | HR 92 | Temp 98.2°F | Wt 192.2 lb

## 2013-07-30 DIAGNOSIS — R0609 Other forms of dyspnea: Secondary | ICD-10-CM | POA: Diagnosis not present

## 2013-07-30 DIAGNOSIS — R21 Rash and other nonspecific skin eruption: Secondary | ICD-10-CM

## 2013-07-30 DIAGNOSIS — R609 Edema, unspecified: Secondary | ICD-10-CM | POA: Diagnosis not present

## 2013-07-30 DIAGNOSIS — R0989 Other specified symptoms and signs involving the circulatory and respiratory systems: Secondary | ICD-10-CM

## 2013-07-30 DIAGNOSIS — R600 Localized edema: Secondary | ICD-10-CM | POA: Insufficient documentation

## 2013-07-30 NOTE — Progress Notes (Signed)
Pre visit review using our clinic review tool, if applicable. No additional management support is needed unless otherwise documented below in the visit note. 

## 2013-07-30 NOTE — Assessment & Plan Note (Signed)
Likely multifactorial and related to inactivity, varicose veins. Will eval with labs.  Elevate legs and put on compression hose as able.  if labs normal consider course of lasix or HCTZ prn,

## 2013-07-30 NOTE — Assessment & Plan Note (Signed)
Rash on legs likely due to swelling. Not consistent with allergic derm and not responding to topical steroid.  Rash on arms, newer and ? Vascular eval with cbc.

## 2013-07-30 NOTE — Patient Instructions (Signed)
Stop at lab on way out. Elevate feet above heart. Consider compression hose.

## 2013-07-30 NOTE — Progress Notes (Signed)
   Subjective:    Patient ID: [name] [name], female    DOB: [birth year], 80 y.o.   MRN: [id number]  Rash Pertinent negatives include no eye pain, fatigue, fever or shortness of breath.    80 year old female with dementia, HTN, peripheral neuropathy presents with new onset  swelling in B legs. 3 plus pitting edema B up to knees. No recent new meds.  Not checking BP at home. BP Readings from Last 3 Encounters:  07/30/13 142/92  07/23/13 140/70  06/17/13 130/76     Saw  Dr Lorelei Pont last week for new onset rash allergic dermatitis on B legs. Had swelling at same time but not addressed.  Rash is tender/tight not itchy.  Not any better with triamcinolone 0.1%  Now non-blanching rash / bruising on B upper arms.  No SOB, no DOE, no ParoxNocdyspnea, she reports no fatigue, but family states she sits all the day.   Review of Systems  Constitutional: Negative for fever and fatigue.  HENT: Negative for ear pain.   Eyes: Negative for pain.  Respiratory: Negative for shortness of breath.   Cardiovascular: Negative for chest pain.  Skin: Positive for rash.       Objective:   Physical Exam  Constitutional: Vital signs are normal. She appears well-developed and well-nourished. She is cooperative.  Non-toxic appearance. She does not appear ill. No distress.  HENT:  Head: Normocephalic.  Right Ear: Hearing, tympanic membrane, external ear and ear canal normal. Tympanic membrane is not erythematous, not retracted and not bulging.  Left Ear: Hearing, tympanic membrane, external ear and ear canal normal. Tympanic membrane is not erythematous, not retracted and not bulging.  Nose: No mucosal edema or rhinorrhea. Right sinus exhibits no maxillary sinus tenderness and no frontal sinus tenderness. Left sinus exhibits no maxillary sinus tenderness and no frontal sinus tenderness.  Mouth/Throat: Uvula is midline, oropharynx is clear and moist and mucous membranes are normal.  Eyes: Conjunctivae, EOM  and lids are normal. Pupils are equal, round, and reactive to light. Lids are everted and swept, no foreign bodies found.  Neck: Trachea normal and normal range of motion. Neck supple. Carotid bruit is not present. No mass and no thyromegaly present.  Cardiovascular: Normal rate, regular rhythm, S1 normal, S2 normal, normal heart sounds, intact distal pulses and normal pulses.  Exam reveals no gallop and no friction rub.   No murmur heard. B 3 plus pitting edema up to knees     Pulmonary/Chest: Effort normal and breath sounds normal. Not tachypneic. No respiratory distress. She has no decreased breath sounds. She has no wheezes. She has no rhonchi. She has no rales.  Abdominal: Soft. Normal appearance and bowel sounds are normal. There is no tenderness.  Neurological: She is alert.  Skin: Skin is warm, dry and intact. No rash noted.  slight erythema and nodular skin on B legs  nonblanching splotchy patches on B upper arms left > right  (definately vascular ? bruising)  Psychiatric: Her speech is normal and behavior is normal. Judgment and thought content normal. Her mood appears not anxious. Cognition and memory are normal. She does not exhibit a depressed mood.          Assessment & Plan:

## 2013-07-31 LAB — COMPREHENSIVE METABOLIC PANEL
ALT: 11 U/L (ref 0–35)
AST: 17 U/L (ref 0–37)
Albumin: 4 g/dL (ref 3.5–5.2)
Alkaline Phosphatase: 75 U/L (ref 39–117)
BUN: 16 mg/dL (ref 6–23)
CO2: 31 mEq/L (ref 19–32)
CREATININE: 0.7 mg/dL (ref 0.4–1.2)
Calcium: 9.5 mg/dL (ref 8.4–10.5)
Chloride: 106 mEq/L (ref 96–112)
GFR: 80.73 mL/min (ref >60.00)
Glucose, Bld: 106 mg/dL — ABNORMAL HIGH (ref 70–99)
Potassium: 4.2 mEq/L (ref 3.5–5.1)
Sodium: 143 mEq/L (ref 135–145)
Total Bilirubin: 0.3 mg/dL (ref 0.2–1.2)
Total Protein: 7.2 g/dL (ref 6.0–8.3)

## 2013-07-31 LAB — CBC WITH DIFFERENTIAL/PLATELET
BASOS ABS: 0 10*3/uL (ref 0.0–0.1)
Basophils Relative: 0.2 % (ref 0.0–3.0)
Eosinophils Absolute: 0.6 10*3/uL (ref 0.0–0.7)
Eosinophils Relative: 6 % — ABNORMAL HIGH (ref 0.0–5.0)
HCT: 41.2 % (ref 36.0–46.0)
HEMOGLOBIN: 13.7 g/dL (ref 12.0–15.0)
LYMPHS PCT: 19.9 % (ref 12.0–46.0)
Lymphs Abs: 2 10*3/uL (ref 0.7–4.0)
MCHC: 33.3 g/dL (ref 30.0–36.0)
MCV: 94.5 fl (ref 78.0–100.0)
MONOS PCT: 4.8 % (ref 3.0–12.0)
Monocytes Absolute: 0.5 10*3/uL (ref 0.1–1.0)
NEUTROS ABS: 7.1 10*3/uL (ref 1.4–7.7)
NEUTROS PCT: 69.1 % (ref 43.0–77.0)
Platelets: 265 10*3/uL (ref 150.0–400.0)
RBC: 4.36 Mil/uL (ref 3.87–5.11)
RDW: 13.7 % (ref 11.5–15.5)
WBC: 10.3 10*3/uL (ref 4.0–10.5)

## 2013-07-31 LAB — TSH: TSH: 1.02 u[IU]/mL (ref 0.35–4.50)

## 2013-07-31 LAB — BRAIN NATRIURETIC PEPTIDE: Pro B Natriuretic peptide (BNP): 30 pg/mL (ref 0.0–100.0)

## 2013-08-02 ENCOUNTER — Telehealth: Payer: Self-pay | Admitting: *Deleted

## 2013-08-02 MED ORDER — POTASSIUM CHLORIDE CRYS ER 20 MEQ PO TBCR: 20.0000 meq | EXTENDED_RELEASE_TABLET | Freq: Every day | ORAL | Status: DC

## 2013-08-02 MED ORDER — FUROSEMIDE 20 MG PO TABS: 20.0000 mg | ORAL_TABLET | Freq: Every day | ORAL | Status: DC

## 2013-08-02 NOTE — Telephone Encounter (Signed)
Message copied by Despina Hidden on Fri Aug 02, 2013  2:31 PM ------      Message from: Helene Shoe      Created: Fri Aug 02, 2013  8:45 AM       Caren Griffins left v/m returning call and request cb [phone]. ------

## 2013-08-02 NOTE — Telephone Encounter (Signed)
Spoke with daughter and advised results. rx sent to pharmacy by e-script  

## 2013-08-02 NOTE — Telephone Encounter (Signed)
Notes Recorded by Jess Barters, LPN on 2/40/9735 at 3:29 AM Caren Griffins left v/m returning call and request cb [phone].  Notes Recorded by Despina Hidden, CMA on 08/01/2013 at 2:20 PM Left message for daughter in law to return my call, Camiyah Friberg [phone]  Notes Recorded by Jinny Sanders, MD on 07/31/2013 at 11:30 PM Notify pt labs nml... No sign of heart failure, nml liver and kidney and no blood disorder evident. Swelling likely due to inactivity and varicose veins. Start lasix 20 mg daily x 5 days along with 20 MEQ of potassium x 5 days. Please send in rx for both #30, 0 refills. Have pt follow up in a week for recheck swelling

## 2013-08-15 ENCOUNTER — Encounter: Payer: Self-pay | Admitting: Family Medicine

## 2013-08-15 ENCOUNTER — Ambulatory Visit (INDEPENDENT_AMBULATORY_CARE_PROVIDER_SITE_OTHER): Payer: Medicare Other | Admitting: Family Medicine

## 2013-08-15 VITALS — BP 120/72 | HR 73 | Temp 98.1°F | Ht 65.0 in | Wt 186.2 lb

## 2013-08-15 DIAGNOSIS — R609 Edema, unspecified: Secondary | ICD-10-CM

## 2013-08-15 DIAGNOSIS — I872 Venous insufficiency (chronic) (peripheral): Secondary | ICD-10-CM

## 2013-08-15 DIAGNOSIS — R21 Rash and other nonspecific skin eruption: Secondary | ICD-10-CM

## 2013-08-15 LAB — BASIC METABOLIC PANEL
BUN: 18 mg/dL (ref 6–23)
CHLORIDE: 102 meq/L (ref 96–112)
CO2: 30 mEq/L (ref 19–32)
Calcium: 9.5 mg/dL (ref 8.4–10.5)
Creatinine, Ser: 0.8 mg/dL (ref 0.4–1.2)
GFR: 79.48 mL/min (ref >60.00)
Glucose, Bld: 122 mg/dL — ABNORMAL HIGH (ref 70–99)
POTASSIUM: 4.8 meq/L (ref 3.5–5.1)
SODIUM: 140 meq/L (ref 135–145)

## 2013-08-15 NOTE — Progress Notes (Signed)
   Subjective:    Patient ID: [name] [name], female    DOB: 1933/03/03, 80 y.o.   MRN: [id number]  HPI  80 year old female presents for follow up peripheral edema. At last OV 2 weeks ago. Nml  CMET, cbc,  BNP  Assessment 07/29/2013:  Likely multifactorial and related to inactivity, varicose veins.  Will eval with labs.  Elevate legs and put on compression hose as able.  if labs normal consider course of lasix or HCTZ prn,   Via phone note started lasix 20 mg daily x 5 days along with 20 MEQ of potassium x 5 days.   Today pt has noted 6 lb weight loss.  Wt Readings from Last 3 Encounters:  08/15/13 186 lb 4 oz (84.482 kg)  07/30/13 192 lb 4 oz (87.204 kg)  07/23/13 191 lb 8 oz (86.864 kg)   Swelling much improved.  Rash on legs gone. No SOB, no CP, no fever.    Also bruising on arms gone.    Review of Systems  Constitutional: Negative for fever and fatigue.  HENT: Negative for ear pain.   Eyes: Negative for pain.  Respiratory: Negative for chest tightness and shortness of breath.   Cardiovascular: Positive for leg swelling. Negative for chest pain and palpitations.  Gastrointestinal: Negative for abdominal pain.  Genitourinary: Negative for dysuria.       Objective:   Physical Exam  Constitutional: Vital signs are normal. She appears well-developed and well-nourished. She is cooperative.  Non-toxic appearance. She does not appear ill. No distress.  HENT:  Head: Normocephalic.  Right Ear: Hearing, tympanic membrane, external ear and ear canal normal. Tympanic membrane is not erythematous, not retracted and not bulging.  Left Ear: Hearing, tympanic membrane, external ear and ear canal normal. Tympanic membrane is not erythematous, not retracted and not bulging.  Nose: No mucosal edema or rhinorrhea. Right sinus exhibits no maxillary sinus tenderness and no frontal sinus tenderness. Left sinus exhibits no maxillary sinus tenderness and no frontal sinus tenderness.    Mouth/Throat: Uvula is midline, oropharynx is clear and moist and mucous membranes are normal.  Eyes: Conjunctivae, EOM and lids are normal. Pupils are equal, round, and reactive to light. Lids are everted and swept, no foreign bodies found.  Neck: Trachea normal and normal range of motion. Neck supple. Carotid bruit is not present. No mass and no thyromegaly present.  Cardiovascular: Normal rate, regular rhythm, S1 normal, S2 normal, normal heart sounds, intact distal pulses and normal pulses.  Exam reveals no gallop and no friction rub.   No murmur heard. B 1 plus pitting edema up to knees  B varicose viens     Pulmonary/Chest: Effort normal and breath sounds normal. Not tachypneic. No respiratory distress. She has no decreased breath sounds. She has no wheezes. She has no rhonchi. She has no rales.  Abdominal: Soft. Normal appearance and bowel sounds are normal. There is no tenderness.  Neurological: She is alert.  Skin: Skin is warm, dry and intact. No rash noted.   Resolved  erythema and nodular skin on B legs  Bruising on arms resolved.   Psychiatric: Her speech is normal and behavior is normal. Judgment and thought content normal. Her mood appears not anxious. Cognition and memory are normal. She does not exhibit a depressed mood.          Assessment & Plan:

## 2013-08-15 NOTE — Assessment & Plan Note (Signed)
Resolved with improvement in swelling.

## 2013-08-15 NOTE — Progress Notes (Signed)
Pre visit review using our clinic review tool, if applicable. No additional management support is needed unless otherwise documented below in the visit note. 

## 2013-08-15 NOTE — Assessment & Plan Note (Signed)
Due to inactivity and venous insufficiency.  Improved with lasix.  Elevate legs, rx for compression hose given again. Encouraged to use to prevent recurrence of swelling.  Will eval BMET for kidney funciton and potassium after 5 days of lasix.  Change lasix to as needed.

## 2013-08-15 NOTE — Patient Instructions (Addendum)
Start wearing compression hose daily. Stop at lab on way out. Daily weights Use lasix only as needed if swelling increasing or weight increasing over 3 lbs in 2-3 days.  If take lasix take a potassium along with it.

## 2013-08-21 ENCOUNTER — Other Ambulatory Visit: Payer: Self-pay | Admitting: Family Medicine

## 2013-11-09 ENCOUNTER — Telehealth: Payer: Self-pay | Admitting: Hematology and Oncology

## 2013-11-09 NOTE — Telephone Encounter (Signed)
s/w pt husband re new appt for lb/VG 11/19 @ 10:30am.

## 2013-11-29 ENCOUNTER — Other Ambulatory Visit: Payer: Self-pay | Admitting: Family Medicine

## 2013-12-19 ENCOUNTER — Other Ambulatory Visit: Payer: Medicare Other

## 2013-12-19 ENCOUNTER — Ambulatory Visit: Payer: Medicare Other | Admitting: Oncology

## 2014-01-01 ENCOUNTER — Other Ambulatory Visit: Payer: Self-pay | Admitting: *Deleted

## 2014-01-01 DIAGNOSIS — D0590 Unspecified type of carcinoma in situ of unspecified breast: Secondary | ICD-10-CM

## 2014-01-02 ENCOUNTER — Other Ambulatory Visit (HOSPITAL_BASED_OUTPATIENT_CLINIC_OR_DEPARTMENT_OTHER): Payer: Medicare Other

## 2014-01-02 ENCOUNTER — Telehealth: Payer: Self-pay | Admitting: Hematology and Oncology

## 2014-01-02 ENCOUNTER — Ambulatory Visit (HOSPITAL_BASED_OUTPATIENT_CLINIC_OR_DEPARTMENT_OTHER): Payer: Medicare Other | Admitting: Hematology and Oncology

## 2014-01-02 DIAGNOSIS — G309 Alzheimer's disease, unspecified: Secondary | ICD-10-CM | POA: Diagnosis not present

## 2014-01-02 DIAGNOSIS — D0592 Unspecified type of carcinoma in situ of left breast: Secondary | ICD-10-CM

## 2014-01-02 DIAGNOSIS — D0590 Unspecified type of carcinoma in situ of unspecified breast: Secondary | ICD-10-CM

## 2014-01-02 DIAGNOSIS — Z853 Personal history of malignant neoplasm of breast: Secondary | ICD-10-CM | POA: Diagnosis not present

## 2014-01-02 LAB — COMPREHENSIVE METABOLIC PANEL (CC13)
ALBUMIN: 3.3 g/dL — AB (ref 3.5–5.0)
ALT: 7 U/L (ref 0–55)
ANION GAP: 6 meq/L (ref 3–11)
AST: 11 U/L (ref 5–34)
Alkaline Phosphatase: 70 U/L (ref 40–150)
BUN: 13.9 mg/dL (ref 7.0–26.0)
CHLORIDE: 104 meq/L (ref 98–109)
CO2: 29 meq/L (ref 22–29)
CREATININE: 0.7 mg/dL (ref 0.6–1.1)
Calcium: 9.4 mg/dL (ref 8.4–10.4)
Glucose: 99 mg/dl (ref 70–140)
POTASSIUM: 4.6 meq/L (ref 3.5–5.1)
SODIUM: 139 meq/L (ref 136–145)
TOTAL PROTEIN: 6.6 g/dL (ref 6.4–8.3)
Total Bilirubin: 0.33 mg/dL (ref 0.20–1.20)

## 2014-01-02 LAB — CBC WITH DIFFERENTIAL/PLATELET
BASO%: 0.3 % (ref 0.0–2.0)
Basophils Absolute: 0 10*3/uL (ref 0.0–0.1)
EOS%: 1.2 % (ref 0.0–7.0)
Eosinophils Absolute: 0.1 10*3/uL (ref 0.0–0.5)
HEMATOCRIT: 40.3 % (ref 34.8–46.6)
HGB: 13.3 g/dL (ref 11.6–15.9)
LYMPH#: 2.4 10*3/uL (ref 0.9–3.3)
LYMPH%: 23.2 % (ref 14.0–49.7)
MCH: 31.3 pg (ref 25.1–34.0)
MCHC: 33 g/dL (ref 31.5–36.0)
MCV: 94.8 fL (ref 79.5–101.0)
MONO#: 0.7 10*3/uL (ref 0.1–0.9)
MONO%: 6.2 % (ref 0.0–14.0)
NEUT#: 7.3 10*3/uL — ABNORMAL HIGH (ref 1.5–6.5)
NEUT%: 69.1 % (ref 38.4–76.8)
Platelets: 220 10*3/uL (ref 145–400)
RBC: 4.25 10*6/uL (ref 3.70–5.45)
RDW: 12.8 % (ref 11.2–14.5)
WBC: 10.5 10*3/uL — ABNORMAL HIGH (ref 3.9–10.3)

## 2014-01-02 NOTE — Assessment & Plan Note (Signed)
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 Overall she tolerated it fairly well.  Alzheimer's: Patient was diagnosed with Alzheimer's dementia. Her husband arranges all her medications. I discussed with her husband regarding the treatment plan to stop tamoxifen.  Surveillance: Today's breast exam was normal. I reviewed the mammograms done in April 2015. There were also normal. Our plan is to watch her once a year for followup. She may be transitioned into survivorship clinic when it becomes functional.

## 2014-01-02 NOTE — Progress Notes (Signed)
Patient Care Team: Jinny Sanders, MD as PCP - General  DIAGNOSIS:DCIS of left breast status post lumpectomy June 2010  PRIOR THERAPY:  #1 status post left breast localized lumpectomy on 07/17/2008 with the final pathology revealing intermediate grade DCIS with necrosis and microcalcifications measuring 1.0 cm. Tumor was ER +100%.  #2 patient underwent radiation therapy to the left breast from 09/11/2008 to 10/09/2008.  #3 she was then begun on tamoxifen 20 mg starting in August 2010. Completed 01/02/2014  CURRENT THERAPY:observation  CHIEF COMPLIANT: followup of DCIS  INTERVAL HISTORY: [name] [name] is a 80 year old Caucasian with above-mentioned history of left sided DCIS that was treated with lumpectomy and radiation and has been on tamoxifen since August 2010. She is here for annual followup. She reports no major problems or concerns tolerating tamoxifen. She was diagnosed with Alzheimer's dementia and apparently does fairly advanced. She is accompanied by her daughter in law and her husband.  REVIEW OF SYSTEMS:   Constitutional: Denies fevers, chills or abnormal weight loss Eyes: Denies blurriness of vision Ears, nose, mouth, throat, and face: Denies mucositis or sore throat Respiratory: Denies cough, dyspnea or wheezes Cardiovascular: Denies palpitation, chest discomfort or lower extremity swelling Gastrointestinal:  Denies nausea, heartburn or change in bowel habits Skin: Denies abnormal skin rashes Lymphatics: Denies new lymphadenopathy or easy bruising Neurological:dementia Behavioral/Psych: Mood is stable, no new changes  Breast:  denies any pain or lumps or nodules in either breasts All other systems were reviewed with the patient and are negative.  I have reviewed the past medical history, past surgical history, social history and family history with the patient and they are unchanged from previous note.  ALLERGIES:  is allergic to sulfonamide derivatives and  sulfur.  MEDICATIONS:  Current Outpatient Prescriptions  Medication Sig Dispense Refill  . atenolol (TENORMIN) 25 MG tablet TAKE 1 TABLET BY MOUTH DAILY 30 tablet 2  . Calcium Carbonate-Vitamin D (CALCIUM 600+D) 600-200 MG-UNIT TABS Take 1 tablet by mouth 2 (two) times daily.     . furosemide (LASIX) 20 MG tablet TAKE 1 TABLET BY MOUTH DAILY 30 tablet 5  . galantamine (RAZADYNE) 4 MG tablet TAKE 1 TABLET BY MOUTH TWICE A DAY WITH A MEAL 30 tablet 9  . lisinopril (PRINIVIL,ZESTRIL) 10 MG tablet TAKE ONE (1) TABLET BY MOUTH EVERY DAY 30 tablet 5  . potassium chloride SA (K-DUR,KLOR-CON) 20 MEQ tablet Take 1 tablet (20 mEq total) by mouth daily. 30 tablet 0  . simvastatin (ZOCOR) 40 MG tablet Take 40 mg by mouth daily.     . tamoxifen (NOLVADEX) 20 MG tablet Take 1 tablet (20 mg total) by mouth daily. 30 tablet 12  . thiamine (VITAMIN B-1) 100 MG tablet Take 100 mg by mouth daily.    Marland Kitchen triamcinolone cream (KENALOG) 0.1 % Apply 1 application topically 2 (two) times daily. 454 g 0   No current facility-administered medications for this visit.    PHYSICAL EXAMINATION: ECOG PERFORMANCE STATUS: 2 - Symptomatic, <50% confined to bed  Filed Vitals:   01/02/14 1029  BP: 125/81  Pulse: 72  Temp: 97.1 F (36.2 C)  Resp: 19   Filed Weights   01/02/14 1029  Weight: 191 lb 1.6 oz (86.682 kg)    GENERAL:alert, no distress and comfortable SKIN: skin color, texture, turgor are normal, no rashes or significant lesions EYES: normal, Conjunctiva are pink and non-injected, sclera clear OROPHARYNX:no exudate, no erythema and lips, buccal mucosa, and tongue normal  NECK: supple, thyroid normal size, non-tender,  without nodularity LYMPH:  no palpable lymphadenopathy in the cervical, axillary or inguinal LUNGS: clear to auscultation and percussion with normal breathing effort HEART: regular rate & rhythm and no murmurs and no lower extremity edema ABDOMEN:abdomen soft, non-tender and normal bowel  sounds Musculoskeletal:no cyanosis of digits and no clubbing  NEURO: alert & oriented x 3 with fluent speech, no focal motor/sensory deficits BREAST: No palpable masses or nodules in either right or left breasts. No palpable axillary supraclavicular or infraclavicular adenopathy no breast tenderness or nipple discharge.the scar of the left breast was palpated no nodularity or concerns or note   LABORATORY DATA:  I have reviewed the data as listed   Chemistry      Component Value Date/Time   NA 139 01/02/2014 1006   NA 140 08/15/2013 1031   NA 141 12/16/2009 1052   K 4.6 01/02/2014 1006   K 4.8 08/15/2013 1031   K 4.4 12/16/2009 1052   CL 102 08/15/2013 1031   CL 104 12/15/2011 1132   CL 101 12/16/2009 1052   CO2 29 01/02/2014 1006   CO2 30 08/15/2013 1031   CO2 30 12/16/2009 1052   BUN 13.9 01/02/2014 1006   BUN 18 08/15/2013 1031   BUN 16 12/16/2009 1052   CREATININE 0.7 01/02/2014 1006   CREATININE 0.8 08/15/2013 1031   CREATININE 0.64 02/05/2013 1606      Component Value Date/Time   CALCIUM 9.4 01/02/2014 1006   CALCIUM 9.5 08/15/2013 1031   CALCIUM 9.4 12/16/2009 1052   ALKPHOS 70 01/02/2014 1006   ALKPHOS 75 07/30/2013 1608   ALKPHOS 48 12/16/2009 1052   AST 11 01/02/2014 1006   AST 17 07/30/2013 1608   AST 18 12/16/2009 1052   ALT 7 01/02/2014 1006   ALT 11 07/30/2013 1608   ALT 19 12/16/2009 1052   BILITOT 0.33 01/02/2014 1006   BILITOT 0.3 07/30/2013 1608   BILITOT 0.50 12/16/2009 1052       Lab Results  Component Value Date   WBC 10.5* 01/02/2014   HGB 13.3 01/02/2014   HCT 40.3 01/02/2014   MCV 94.8 01/02/2014   PLT 220 01/02/2014   NEUTROABS 7.3* 01/02/2014     RADIOGRAPHIC STUDIES: I have personally reviewed the radiology reports and agreed with their findings. 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 Overall she tolerated it fairly well.  Alzheimer's: Patient was diagnosed with Alzheimer's dementia. Her husband arranges all her medications. I discussed with her husband regarding the treatment plan to stop tamoxifen.  Surveillance: Today's breast exam was normal. I reviewed the mammograms done in April 2015. There were also normal. Our plan is to watch her once a year for followup. She may be transitioned into survivorship clinic when it becomes functional.    Orders Placed This Encounter  Procedures  . CBC with Differential    Standing Status: Future     Number of Occurrences:      Standing Expiration Date: 01/02/2015  . Comprehensive metabolic panel (Cmet) - [facility]    Standing Status: Future     Number of Occurrences:      Standing Expiration Date: 01/02/2015   The patient has  a good understanding of the overall plan. she agrees with it. She will call with any problems that may develop before her next visit here.   Rulon Eisenmenger, MD 01/02/2014 11:36 AM

## 2014-01-02 NOTE — Telephone Encounter (Signed)
per pof to sch pt appt-gave pt copy of sch °

## 2014-01-30 ENCOUNTER — Encounter: Payer: Self-pay | Admitting: Family Medicine

## 2014-01-30 ENCOUNTER — Ambulatory Visit (INDEPENDENT_AMBULATORY_CARE_PROVIDER_SITE_OTHER): Payer: Medicare Other | Admitting: Family Medicine

## 2014-01-30 VITALS — BP 120/70 | HR 72 | Temp 98.8°F | Ht 65.0 in | Wt 194.5 lb

## 2014-01-30 DIAGNOSIS — F0280 Dementia in other diseases classified elsewhere without behavioral disturbance: Secondary | ICD-10-CM | POA: Diagnosis not present

## 2014-01-30 DIAGNOSIS — G309 Alzheimer's disease, unspecified: Secondary | ICD-10-CM | POA: Diagnosis not present

## 2014-01-30 DIAGNOSIS — R159 Full incontinence of feces: Secondary | ICD-10-CM | POA: Diagnosis not present

## 2014-01-30 DIAGNOSIS — R4182 Altered mental status, unspecified: Secondary | ICD-10-CM | POA: Diagnosis not present

## 2014-01-30 DIAGNOSIS — N39490 Overflow incontinence: Secondary | ICD-10-CM | POA: Diagnosis not present

## 2014-01-30 LAB — COMPREHENSIVE METABOLIC PANEL
ALT: 13 U/L (ref 0–35)
AST: 17 U/L (ref 0–37)
Albumin: 3.5 g/dL (ref 3.5–5.2)
Alkaline Phosphatase: 65 U/L (ref 39–117)
BILIRUBIN TOTAL: 0.2 mg/dL (ref 0.2–1.2)
BUN: 19 mg/dL (ref 6–23)
CO2: 26 meq/L (ref 19–32)
CREATININE: 0.8 mg/dL (ref 0.4–1.2)
Calcium: 9.3 mg/dL (ref 8.4–10.5)
Chloride: 103 mEq/L (ref 96–112)
GFR: 75.87 mL/min (ref >60.00)
GLUCOSE: 127 mg/dL — AB (ref 70–99)
Potassium: 4.6 mEq/L (ref 3.5–5.1)
Sodium: 136 mEq/L (ref 135–145)
Total Protein: 6.8 g/dL (ref 6.0–8.3)

## 2014-01-30 LAB — POCT URINALYSIS DIPSTICK
Bilirubin, UA: NEGATIVE
Blood, UA: NEGATIVE
GLUCOSE UA: NEGATIVE
Ketones, UA: NEGATIVE
Leukocytes, UA: NEGATIVE
NITRITE UA: NEGATIVE
Protein, UA: NEGATIVE
Spec Grav, UA: >=1.03
UROBILINOGEN UA: 0.2
pH, UA: 6

## 2014-01-30 LAB — CBC WITH DIFFERENTIAL/PLATELET
BASOS PCT: 0.6 % (ref 0.0–3.0)
Basophils Absolute: 0.1 10*3/uL (ref 0.0–0.1)
EOS PCT: 1.6 % (ref 0.0–5.0)
Eosinophils Absolute: 0.2 10*3/uL (ref 0.0–0.7)
HEMATOCRIT: 41.1 % (ref 36.0–46.0)
HEMOGLOBIN: 13.6 g/dL (ref 12.0–15.0)
LYMPHS ABS: 2.9 10*3/uL (ref 0.7–4.0)
Lymphocytes Relative: 27.1 % (ref 12.0–46.0)
MCHC: 33.1 g/dL (ref 30.0–36.0)
MCV: 95.4 fl (ref 78.0–100.0)
MONO ABS: 0.8 10*3/uL (ref 0.1–1.0)
Monocytes Relative: 7 % (ref 3.0–12.0)
NEUTROS ABS: 6.9 10*3/uL (ref 1.4–7.7)
Neutrophils Relative %: 63.7 % (ref 43.0–77.0)
Platelets: 259 10*3/uL (ref 150.0–400.0)
RBC: 4.3 Mil/uL (ref 3.87–5.11)
RDW: 14 % (ref 11.5–15.5)
WBC: 10.8 10*3/uL — AB (ref 4.0–10.5)

## 2014-01-30 LAB — TSH: TSH: 2.09 u[IU]/mL (ref 0.35–4.50)

## 2014-01-30 LAB — VITAMIN D 25 HYDROXY (VIT D DEFICIENCY, FRACTURES): VITD: 40.59 ng/mL (ref 30.00–100.00)

## 2014-01-30 LAB — VITAMIN B12: Vitamin B-12: 226 pg/mL (ref 211–911)

## 2014-01-30 MED ORDER — GALANTAMINE HYDROBROMIDE 8 MG PO TABS
8.0000 mg | ORAL_TABLET | Freq: Two times a day (BID) | ORAL | Status: DC
Start: 2014-01-30 — End: 2014-06-27

## 2014-01-30 NOTE — Progress Notes (Signed)
Pre visit review using our clinic review tool, if applicable. No additional management support is needed unless otherwise documented below in the visit note. 

## 2014-01-30 NOTE — Assessment & Plan Note (Signed)
Further decline, subacute. Increase galantamine to 8 BID, recheck with MMSE in 1 month. May consider increasing to 12 mg dialy

## 2014-01-30 NOTE — Assessment & Plan Note (Signed)
IN last 3-4 months, more rapid decline in last month.   Will eval for urine infection. Can consider labs to make sure no other reversible cause/contributors.

## 2014-01-30 NOTE — Assessment & Plan Note (Signed)
UA clear today.

## 2014-01-30 NOTE — Addendum Note (Signed)
Addended by: Carter Kitten on: 01/30/2014 02:55 PM   Modules accepted: Orders

## 2014-01-30 NOTE — Patient Instructions (Addendum)
Increase galantamine to 8 mg twice daily. Call if any new SE. Follow up dementia in 1 month.

## 2014-01-30 NOTE — Progress Notes (Signed)
   Subjective:    Patient ID: [name] [name], female    DOB: [birth year], 80 y.o.   MRN: [id number]  HPI   80 year old female with moderate alzheimer's on galantamine  4 mgpresents for follow up.  She is here with her husband and daughter in law today.   They have noted loss on control of urine and BMs in last 3-2month, was worse for a while, but some better now.  She is now swearing sillohette pants.    Her memory is worse in last month. Husband has to help her get her clothes on her at times, needs to help bathe. Has person coming to help prepare meals started  in last week, 3 hours 5 days a week.  She is able to feed herself. No urine odor, no blood, no diarrhea, no abdominal pain. No dysuria.  No fever.  She refused aricept, namenda and exelon patches,because it made her nauseated, diarrhea. She refuses to take these medications past she was flowed by Dr. Leta Baptist, neuro  She was started on galantamine 4 mg daily 1 year ago. She has no SE.  No hearing and vision issues.  Review of Systems  Constitutional: Negative for fever and fatigue.  HENT: Negative for ear pain.   Eyes: Negative for pain.  Respiratory: Negative for chest tightness and shortness of breath.   Cardiovascular: Negative for chest pain, palpitations and leg swelling.  Gastrointestinal: Negative for abdominal pain.  Genitourinary: Negative for dysuria.       Objective:   Physical Exam  Constitutional: Vital signs are normal. She appears well-developed and well-nourished. She is cooperative.  Non-toxic appearance. She does not appear ill. No distress.  tremor and uses cane  HENT:  Head: Normocephalic.  Right Ear: Hearing, tympanic membrane, external ear and ear canal normal. Tympanic membrane is not erythematous, not retracted and not bulging.  Left Ear: Hearing, tympanic membrane, external ear and ear canal normal. Tympanic membrane is not erythematous, not retracted and not bulging.  Nose: No mucosal edema or  rhinorrhea. Right sinus exhibits no maxillary sinus tenderness and no frontal sinus tenderness. Left sinus exhibits no maxillary sinus tenderness and no frontal sinus tenderness.  Mouth/Throat: Uvula is midline, oropharynx is clear and moist and mucous membranes are normal.  Eyes: Conjunctivae, EOM and lids are normal. Pupils are equal, round, and reactive to light. Lids are everted and swept, no foreign bodies found.  Neck: Trachea normal and normal range of motion. Neck supple. Carotid bruit is not present. No thyroid mass and no thyromegaly present.  Cardiovascular: Normal rate, regular rhythm, S1 normal, S2 normal, normal heart sounds, intact distal pulses and normal pulses.  Exam reveals no gallop and no friction rub.   No murmur heard. Pulmonary/Chest: Effort normal and breath sounds normal. No tachypnea. No respiratory distress. She has no decreased breath sounds. She has no wheezes. She has no rhonchi. She has no rales.  Abdominal: Soft. Normal appearance and bowel sounds are normal. There is no tenderness.  Neurological: She is alert.  Skin: Skin is warm, dry and intact. No rash noted.  Psychiatric: Her speech is normal and behavior is normal. Thought content normal. Her mood appears not anxious. Cognition and memory are impaired. She expresses inappropriate judgment. She does not exhibit a depressed mood. She expresses no suicidal plans and no homicidal plans. She exhibits abnormal recent memory.          Assessment & Plan:

## 2014-03-05 ENCOUNTER — Other Ambulatory Visit: Payer: Self-pay | Admitting: Family Medicine

## 2014-03-17 ENCOUNTER — Ambulatory Visit: Payer: Medicare Other | Admitting: Family Medicine

## 2014-03-18 ENCOUNTER — Ambulatory Visit: Payer: Medicare Other | Admitting: Family Medicine

## 2014-03-21 ENCOUNTER — Encounter: Payer: Self-pay | Admitting: Family Medicine

## 2014-03-21 ENCOUNTER — Ambulatory Visit (INDEPENDENT_AMBULATORY_CARE_PROVIDER_SITE_OTHER): Payer: Medicare Other | Admitting: Family Medicine

## 2014-03-21 VITALS — BP 120/70 | HR 64 | Temp 98.5°F | Ht 65.0 in | Wt 192.5 lb

## 2014-03-21 DIAGNOSIS — F0280 Dementia in other diseases classified elsewhere without behavioral disturbance: Secondary | ICD-10-CM

## 2014-03-21 DIAGNOSIS — G309 Alzheimer's disease, unspecified: Secondary | ICD-10-CM | POA: Diagnosis not present

## 2014-03-21 NOTE — Progress Notes (Signed)
Pre visit review using our clinic review tool, if applicable. No additional management support is needed unless otherwise documented below in the visit note. 

## 2014-03-21 NOTE — Assessment & Plan Note (Signed)
Stay on higher dose galantamine now tolerating. Will re-eval MMSE in 3 month, consider increasing galantamine further at that time.

## 2014-03-21 NOTE — Progress Notes (Signed)
   Subjective:    Patient ID: Hassan Rowan, female    DOB: [birth year], 81 y.o.   MRN: [id number]  HPI  81 year old female with history of moderate dementia returns for 1 month follow up Urine was clear, vit D, vit B12, TSH, cbc were normal.   Today she is here with her family. They report that initially she had some upset stomach but now tolerating okay.  She has now only been taking 8 mg BID x 8 days.  They have not noticed any further decline from Dec.  They now have some coming out to help with Ms Filsinger and to help with cleaning/cooking etc.     Review of Systems  Constitutional: Negative for fever and fatigue.  HENT: Negative for ear pain.   Eyes: Negative for pain.  Respiratory: Negative for chest tightness and shortness of breath.   Cardiovascular: Negative for chest pain, palpitations and leg swelling.  Gastrointestinal: Negative for abdominal pain.  Genitourinary: Negative for dysuria.       Incontinence continued       Objective:   Physical Exam  Constitutional: Vital signs are normal. She appears well-developed and well-nourished. She is cooperative.  Non-toxic appearance. She does not appear ill. No distress.  tremor and uses cane  HENT:  Head: Normocephalic.  Right Ear: Hearing, tympanic membrane, external ear and ear canal normal. Tympanic membrane is not erythematous, not retracted and not bulging.  Left Ear: Hearing, tympanic membrane, external ear and ear canal normal. Tympanic membrane is not erythematous, not retracted and not bulging.  Nose: No mucosal edema or rhinorrhea. Right sinus exhibits no maxillary sinus tenderness and no frontal sinus tenderness. Left sinus exhibits no maxillary sinus tenderness and no frontal sinus tenderness.  Mouth/Throat: Uvula is midline, oropharynx is clear and moist and mucous membranes are normal.  Eyes: Conjunctivae, EOM and lids are normal. Pupils are equal, round, and reactive to light. Lids are everted and swept, no  foreign bodies found.  Neck: Trachea normal and normal range of motion. Neck supple. Carotid bruit is not present. No thyroid mass and no thyromegaly present.  Cardiovascular: Normal rate, regular rhythm, S1 normal, S2 normal, normal heart sounds, intact distal pulses and normal pulses.  Exam reveals no gallop and no friction rub.   No murmur heard. Pulmonary/Chest: Effort normal and breath sounds normal. No tachypnea. No respiratory distress. She has no decreased breath sounds. She has no wheezes. She has no rhonchi. She has no rales.  Abdominal: Soft. Normal appearance and bowel sounds are normal. There is no tenderness.  Neurological: She is alert.  Skin: Skin is warm, dry and intact. No rash noted.  Psychiatric: Her speech is normal and behavior is normal. Thought content normal. Her mood appears not anxious. Cognition and memory are impaired. She expresses inappropriate judgment. She does not exhibit a depressed mood. She expresses no suicidal plans and no homicidal plans. She exhibits abnormal recent memory.      MMSE 12/30    Assessment & Plan:

## 2014-03-21 NOTE — Patient Instructions (Addendum)
Continue galantamine at current dose.   Call if not tolerating well.

## 2014-05-06 ENCOUNTER — Telehealth: Payer: Self-pay

## 2014-05-06 NOTE — Telephone Encounter (Signed)
No answer, no way to leave message about flu vaccine

## 2014-06-09 DIAGNOSIS — H52203 Unspecified astigmatism, bilateral: Secondary | ICD-10-CM | POA: Diagnosis not present

## 2014-06-09 DIAGNOSIS — Z961 Presence of intraocular lens: Secondary | ICD-10-CM | POA: Diagnosis not present

## 2014-06-20 ENCOUNTER — Ambulatory Visit: Payer: Medicare Other | Admitting: Family Medicine

## 2014-06-27 ENCOUNTER — Encounter: Payer: Self-pay | Admitting: Family Medicine

## 2014-06-27 ENCOUNTER — Ambulatory Visit (INDEPENDENT_AMBULATORY_CARE_PROVIDER_SITE_OTHER): Payer: Medicare Other | Admitting: Family Medicine

## 2014-06-27 VITALS — BP 159/79 | HR 79 | Temp 98.6°F | Ht 65.0 in | Wt 202.2 lb

## 2014-06-27 DIAGNOSIS — G309 Alzheimer's disease, unspecified: Secondary | ICD-10-CM | POA: Diagnosis not present

## 2014-06-27 DIAGNOSIS — F0280 Dementia in other diseases classified elsewhere without behavioral disturbance: Secondary | ICD-10-CM

## 2014-06-27 MED ORDER — GALANTAMINE HYDROBROMIDE 12 MG PO TABS: 12.0000 mg | ORAL_TABLET | Freq: Two times a day (BID) | ORAL | Status: DC

## 2014-06-27 NOTE — Assessment & Plan Note (Signed)
Worsened symptoms per family. Fairly stable MMSE on galantamine 8 mg BID dose.  Will increase galantamine to 12 mg BI>  Follow up in 3 months.  Encouraged exercise and activity.

## 2014-06-27 NOTE — Progress Notes (Signed)
Pre visit review using our clinic review tool, if applicable. No additional management support is needed unless otherwise documented below in the visit note. 

## 2014-06-27 NOTE — Progress Notes (Signed)
   Subjective:    Patient ID: [name] [name], female    DOB: 1933/03/14, 81 y.o.   MRN: [id number]  HPI  81 year old female with alzheimer's dementia presents for 3 months follow up.  She is currently on galantamine  8 mg BID.  She is here today with her family. Her daughter in law thinks her memory is getting worse, husband not better. Pt has been going to talk about visiting dead relative. No mood changes.   She dends on her husband a lot. Husband has to help her get her clothes on her at times, needs to help bathe. Has person coming to help prepare meals started in last week, 3 hours 5 days a week. She is able to feed herself.  She refused aricept, namenda and exelon patches in past ,because it made her nauseated, diarrhea. She refuses to take these medications past she was flowed by Dr. Leta Baptist, neuro  No SE to galantamine    Review of Systems  Constitutional: Negative for fever and fatigue.  HENT: Negative for ear pain.   Eyes: Negative for pain.  Respiratory: Negative for chest tightness and shortness of breath.   Cardiovascular: Negative for chest pain, palpitations and leg swelling.  Gastrointestinal: Negative for abdominal pain.  Genitourinary: Negative for dysuria.       Objective:   Physical Exam  Constitutional: Vital signs are normal. She appears well-developed and well-nourished. She is cooperative.  Non-toxic appearance. She does not appear ill. No distress.  HENT:  Head: Normocephalic.  Right Ear: Hearing, tympanic membrane, external ear and ear canal normal. Tympanic membrane is not erythematous, not retracted and not bulging.  Left Ear: Hearing, tympanic membrane, external ear and ear canal normal. Tympanic membrane is not erythematous, not retracted and not bulging.  Nose: No mucosal edema or rhinorrhea. Right sinus exhibits no maxillary sinus tenderness and no frontal sinus tenderness. Left sinus exhibits no maxillary sinus tenderness and no frontal sinus  tenderness.  Mouth/Throat: Uvula is midline, oropharynx is clear and moist and mucous membranes are normal.  Eyes: Conjunctivae, EOM and lids are normal. Pupils are equal, round, and reactive to light. Lids are everted and swept, no foreign bodies found.  Neck: Trachea normal and normal range of motion. Neck supple. Carotid bruit is not present. No thyroid mass and no thyromegaly present.  Cardiovascular: Normal rate, regular rhythm, S1 normal, S2 normal, normal heart sounds, intact distal pulses and normal pulses.  Exam reveals no gallop and no friction rub.   No murmur heard. Pulmonary/Chest: Effort normal and breath sounds normal. No tachypnea. No respiratory distress. She has no decreased breath sounds. She has no wheezes. She has no rhonchi. She has no rales.  Abdominal: Soft. Normal appearance and bowel sounds are normal. There is no tenderness.  Neurological: She is alert.  Oriented to person and place not time   MMSE 13-15/30  Skin: Skin is warm, dry and intact. No rash noted.  Psychiatric: Her speech is normal and behavior is normal. Thought content normal. Her mood appears not anxious. Cognition and memory are impaired. She expresses inappropriate judgment. She does not exhibit a depressed mood. She exhibits abnormal recent memory and abnormal remote memory.          Assessment & Plan:

## 2014-06-27 NOTE — Patient Instructions (Addendum)
Increase galantamine to 12 mg twice daily. Schedule medicare wellness in 3 months with labs prior.

## 2014-09-05 ENCOUNTER — Encounter: Payer: Self-pay | Admitting: Gastroenterology

## 2014-09-11 ENCOUNTER — Other Ambulatory Visit: Payer: Self-pay | Admitting: Family Medicine

## 2014-09-29 ENCOUNTER — Telehealth: Payer: Self-pay | Admitting: Family Medicine

## 2014-09-29 DIAGNOSIS — I10 Essential (primary) hypertension: Secondary | ICD-10-CM

## 2014-09-29 DIAGNOSIS — M8580 Other specified disorders of bone density and structure, unspecified site: Secondary | ICD-10-CM

## 2014-09-29 DIAGNOSIS — E7800 Pure hypercholesterolemia, unspecified: Secondary | ICD-10-CM

## 2014-09-29 DIAGNOSIS — E559 Vitamin D deficiency, unspecified: Secondary | ICD-10-CM

## 2014-09-29 NOTE — Telephone Encounter (Signed)
-----   Message from Ellamae Sia sent at 09/24/2014  2:43 PM EDT ----- Regarding: Lab orders for Monday, 8.15.16 Patient is scheduled for CPX labs, please order future labs, Thanks , Karna Christmas

## 2014-09-30 ENCOUNTER — Other Ambulatory Visit (INDEPENDENT_AMBULATORY_CARE_PROVIDER_SITE_OTHER): Payer: Medicare Other

## 2014-09-30 DIAGNOSIS — I10 Essential (primary) hypertension: Secondary | ICD-10-CM

## 2014-09-30 DIAGNOSIS — E559 Vitamin D deficiency, unspecified: Secondary | ICD-10-CM | POA: Diagnosis not present

## 2014-09-30 LAB — COMPREHENSIVE METABOLIC PANEL
ALK PHOS: 80 U/L (ref 39–117)
ALT: 12 U/L (ref 0–35)
AST: 14 U/L (ref 0–37)
Albumin: 3.7 g/dL (ref 3.5–5.2)
BILIRUBIN TOTAL: 0.4 mg/dL (ref 0.2–1.2)
BUN: 13 mg/dL (ref 6–23)
CO2: 32 mEq/L (ref 19–32)
CREATININE: 0.67 mg/dL (ref 0.40–1.20)
Calcium: 9.4 mg/dL (ref 8.4–10.5)
Chloride: 102 mEq/L (ref 96–112)
GFR: 90.27 mL/min (ref >60.00)
GLUCOSE: 111 mg/dL — AB (ref 70–99)
Potassium: 4.4 mEq/L (ref 3.5–5.1)
SODIUM: 139 meq/L (ref 135–145)
Total Protein: 6.8 g/dL (ref 6.0–8.3)

## 2014-09-30 LAB — VITAMIN D 25 HYDROXY (VIT D DEFICIENCY, FRACTURES): VITD: 38.39 ng/mL (ref 30.00–100.00)

## 2014-10-03 ENCOUNTER — Encounter: Payer: Self-pay | Admitting: Family Medicine

## 2014-10-03 ENCOUNTER — Ambulatory Visit (INDEPENDENT_AMBULATORY_CARE_PROVIDER_SITE_OTHER): Payer: Medicare Other | Admitting: Family Medicine

## 2014-10-03 VITALS — BP 172/80 | HR 86 | Temp 99.1°F | Ht 64.5 in | Wt 205.0 lb

## 2014-10-03 DIAGNOSIS — Z0000 Encounter for general adult medical examination without abnormal findings: Secondary | ICD-10-CM | POA: Diagnosis not present

## 2014-10-03 DIAGNOSIS — R7309 Other abnormal glucose: Secondary | ICD-10-CM | POA: Diagnosis not present

## 2014-10-03 DIAGNOSIS — I10 Essential (primary) hypertension: Secondary | ICD-10-CM

## 2014-10-03 DIAGNOSIS — E7800 Pure hypercholesterolemia, unspecified: Secondary | ICD-10-CM

## 2014-10-03 DIAGNOSIS — E559 Vitamin D deficiency, unspecified: Secondary | ICD-10-CM

## 2014-10-03 DIAGNOSIS — Z23 Encounter for immunization: Secondary | ICD-10-CM | POA: Diagnosis not present

## 2014-10-03 DIAGNOSIS — G309 Alzheimer's disease, unspecified: Secondary | ICD-10-CM | POA: Diagnosis not present

## 2014-10-03 DIAGNOSIS — F0280 Dementia in other diseases classified elsewhere without behavioral disturbance: Secondary | ICD-10-CM

## 2014-10-03 DIAGNOSIS — R7303 Prediabetes: Secondary | ICD-10-CM

## 2014-10-03 DIAGNOSIS — Z7189 Other specified counseling: Secondary | ICD-10-CM

## 2014-10-03 DIAGNOSIS — E1140 Type 2 diabetes mellitus with diabetic neuropathy, unspecified: Secondary | ICD-10-CM | POA: Insufficient documentation

## 2014-10-03 NOTE — Assessment & Plan Note (Signed)
Stable control. 

## 2014-10-03 NOTE — Patient Instructions (Addendum)
Check blood pressure daily, at different times a day x 1-2 weeks to make  Blood pressure is not elevated.  Call or email with the BP measurements in 1-2 weeks.  Make sure taking lisinopril, atenolol and lasix. Call if she is not.  Try to restart walking 3-5 times a week.

## 2014-10-03 NOTE — Progress Notes (Signed)
HPI  I have personally reviewed the Medicare Annual Wellness questionnaire and have noted 1. The patient's medical and social history 2. Their use of alcohol, tobacco or illicit drugs 3. Their current medications and supplements 4. The patient's functional ability including ADL's, fall risks, home safety risks and hearing or visual             impairment. 5. Diet and physical activities 6. Evidence for depression or mood disorders 7.         Updated provider list Cognitive evaluation was performed and recorded on pt medicare questionnaire form. The patients weight, height, BMI and visual acuity have been recorded in the chart  I have made referrals, counseling and provided education to the patient based review of the above and I have provided the pt with a written personalized care plan for preventive services.   Alzheimer's dementia, moderate:  She refused aricept, namenda and exelon patches,because it made her nauseated, diarrhea.  Stable control on galantamine 12 mg BID. No suggestion of depression.   No wandering, no psychosis.  Hypertension: Poor control on lisinopril  on atenolol, lasix. BP Readings from Last 3 Encounters:  10/03/14 172/80  06/27/14 159/79  03/21/14 120/70  Using medication without problems or lightheadedness: None  Chest pain with exertion:None  Edema:None  Short of breath:None  Average home BPs: not checking  Other issues:   Elevated Cholesterol: Previously LDL at goal < 130 on zocor 40 mg daily . No recent check. Lab Results  Component Value Date   CHOL 212* 02/05/2013   HDL 58 02/05/2013   LDLCALC 128* 02/05/2013   LDLDIRECT 143.8 01/27/2012   TRIG 130 02/05/2013   CHOLHDL 3.7 02/05/2013     Using medications without problems: none Muscle aches: None Diet compliance: healthy, eats a lot of sweets.  Exercise: walks some Other complaints:   Wt Readings from Last 3 Encounters:  10/03/14 205 lb (92.987 kg)  06/27/14 202 lb 4 oz  (91.74 kg)  03/21/14 192 lb 8 oz (87.317 kg)   Review of Systems  Constitutional: Negative for fever, fatigue and unexpected weight change.  HENT: Negative for ear pain, congestion, sore throat, sneezing, trouble swallowing and sinus pressure.  Eyes: Negative for pain and itching.  Respiratory: Negative for cough, shortness of breath and wheezing.  Cardiovascular: Negative for chest pain, palpitations and leg swelling.  Gastrointestinal: Negative for nausea, abdominal pain, diarrhea, constipation and blood in stool.  Genitourinary: Negative for dysuria, hematuria, vaginal discharge and difficulty urinating.  Skin: Negative for rash.  Neurological: No numbness. Negative for syncope, weakness, light-headedness and headaches.  Psychiatric/Behavioral: Positive for confusion. Negative for dysphoric mood. The patient is not nervous/anxious.  Objective:   Physical Exam  Constitutional: Vital signs are normal. She appears well-developed and well-nourished. She is cooperative. Non-toxic appearance. She does not appear ill. No distress.  HENT:  Head: Normocephalic.  Right Ear: Hearing, tympanic membrane, external ear and ear canal normal.  Left Ear: Hearing, tympanic membrane, external ear and ear canal normal.  Nose: Nose normal.  Eyes: Conjunctivae normal, EOM and lids are normal. Pupils are equal, round, and reactive to light. No foreign bodies found.  Neck: Trachea normal and normal range of motion. Neck supple. Carotid bruit is not present. No mass and no thyromegaly present.  Cardiovascular: Normal rate, regular rhythm, S1 normal, S2 normal, normal heart sounds and intact distal pulses. Exam reveals no gallop and no friction rub.  No murmur heard.  Pulmonary/Chest: Effort normal and breath sounds normal.  No respiratory distress. She has no wheezes. She has no rhonchi. She has no rales. She exhibits no tenderness.  Abdominal: Soft. Normal appearance and bowel sounds are  normal. She exhibits no distension, no fluid wave, no abdominal bruit and no mass. There is no hepatosplenomegaly. There is no tenderness. There is no rebound, no guarding and no CVA tenderness. No hernia.  Lymphadenopathy:  She has no cervical adenopathy.  She has no axillary adenopathy.  Neurological: She is alert. She has normal strength. No cranial nerve deficit or sensory deficit. Coordination normal.  Skin: Skin is warm, dry and intact. No rash noted.  Psychiatric: She has a normal mood and affect. Her speech is normal and behavior is normal. Judgment normal. Her mood appears not anxious. She does not exhibit a depressed mood. She exhibits abnormal recent memory.  Assessment & Plan:   The patient's preventative maintenance and recommended screening tests for an annual wellness exam were reviewed in full today.  Brought up to date unless services declined.  Counselled on the importance of diet, exercise, and its role in overall health and mortality.  The patient's FH and SH was reviewed, including their home life, tobacco status, and drug and alcohol status.   Vaccines:uptodate PNA and td, refused shingles, FLU AND PREVNAR DUE. DVE/pap: not indicated  DEXA: 06/2012 osteopenia stable from last check. Mammo: hx of breast cancer, 05/2013 mammo stable, followed by ONC, has appt soon. Colon: 2013 sessile polyp Dr. Fuller Plan, no further indicated due to age. Former smoker, minimal use.

## 2014-10-03 NOTE — Assessment & Plan Note (Signed)
Previosuly well controlled. Follow at home and call with measurements.

## 2014-10-03 NOTE — Assessment & Plan Note (Addendum)
Stable on galantamine 12 mg BID.

## 2014-10-03 NOTE — Assessment & Plan Note (Signed)
Decrease carb in diet.

## 2014-10-03 NOTE — Addendum Note (Signed)
Addended by: Carter Kitten on: 10/03/2014 02:54 PM   Modules accepted: Orders

## 2014-10-03 NOTE — Assessment & Plan Note (Signed)
Tolerating simvastatin well, no SE. She has chosen to continue it at this point No recent check.. Check at next lab draw.

## 2014-10-03 NOTE — Progress Notes (Signed)
Pre visit review using our clinic review tool, if applicable. No additional management support is needed unless otherwise documented below in the visit note. 

## 2014-10-13 ENCOUNTER — Telehealth: Payer: Self-pay | Admitting: *Deleted

## 2014-10-13 NOTE — Telephone Encounter (Signed)
Patient's husband dropped off recent BP readings.  8/20 @ 0900 170/87 8/20 @ 1500 133/75 8/21 @ 1200 150/70 8/22               170/88 8/22 @ 1800 163/80 8/23 @ 1200 160/72            137/64            165/78 8/29 @ 1000 160/72

## 2014-10-13 NOTE — Telephone Encounter (Signed)
BP too high. Have pt increase lisinopril to 20 mg daily, change in med list. Can call in new rx if needed. 11RF  Follow BPs at home, make appt for BP check in 2 weeks, will need to check kidney function at that time on higher dose of med.

## 2014-10-14 MED ORDER — LISINOPRIL 20 MG PO TABS: 20.0000 mg | ORAL_TABLET | Freq: Every day | ORAL | Status: DC

## 2014-10-14 NOTE — Telephone Encounter (Signed)
Mr. Ridings notified as instructed by telephone.  Follow up appointment scheduled for 10/28/2014 at 9:15am with Dr. Diona Browner.  New prescription for Lisinopril 20 mg sent to [facility].

## 2014-10-28 ENCOUNTER — Encounter: Payer: Self-pay | Admitting: Family Medicine

## 2014-10-28 ENCOUNTER — Ambulatory Visit (INDEPENDENT_AMBULATORY_CARE_PROVIDER_SITE_OTHER): Payer: Medicare Other | Admitting: Family Medicine

## 2014-10-28 VITALS — BP 138/70 | HR 78 | Temp 97.7°F | Ht 64.5 in | Wt 203.0 lb

## 2014-10-28 DIAGNOSIS — I10 Essential (primary) hypertension: Secondary | ICD-10-CM | POA: Diagnosis not present

## 2014-10-28 LAB — BASIC METABOLIC PANEL
BUN: 10 mg/dL (ref 6–23)
CO2: 33 mEq/L — ABNORMAL HIGH (ref 19–32)
Calcium: 9.5 mg/dL (ref 8.4–10.5)
Chloride: 103 mEq/L (ref 96–112)
Creatinine, Ser: 0.69 mg/dL (ref 0.40–1.20)
GFR: 87.24 mL/min (ref >60.00)
GLUCOSE: 95 mg/dL (ref 70–99)
Potassium: 4.2 mEq/L (ref 3.5–5.1)
SODIUM: 142 meq/L (ref 135–145)

## 2014-10-28 NOTE — Assessment & Plan Note (Signed)
Improved control on higher dose of lisinpril. Not needing lasix for swelling. Elevating feet prn.  Check Cr today.

## 2014-10-28 NOTE — Patient Instructions (Signed)
Continue current meds.  Stop at lab on way out.

## 2014-10-28 NOTE — Addendum Note (Signed)
Addended by: Marchia Bond on: 10/28/2014 03:13 PM   Modules accepted: Miquel Dunn

## 2014-10-28 NOTE — Progress Notes (Signed)
   Subjective:    Patient ID: [name] [name], female    DOB: [birth year], 81 y.o.   MRN: [id number]  HPI  Hypertension: Improved control on  Higher dose of lisinopril 20 mg and on atenolol,  No No longer taking lasix. BP Readings from Last 3 Encounters:  10/28/14 138/70  10/03/14 172/80  06/27/14 159/79   Using medication without problems or lightheadedness: None  Chest pain with exertion:None  Edema:mild. Short of breath:None  Average home BPs: 140/70 Other issues:             Walking  A lot.   Need re-eval creatinine.   Review of Systems  Constitutional: Negative for fever and fatigue.  HENT: Negative for ear pain.   Eyes: Negative for pain.  Respiratory: Negative for chest tightness and shortness of breath.   Cardiovascular: Negative for chest pain, palpitations and leg swelling.  Gastrointestinal: Negative for abdominal pain.  Genitourinary: Negative for dysuria.       Objective:   Physical Exam  Constitutional: Vital signs are normal. She appears well-developed and well-nourished. She is cooperative.  Non-toxic appearance. She does not appear ill. No distress.  Overweight elderly  HENT:  Head: Normocephalic.  Right Ear: Hearing, tympanic membrane, external ear and ear canal normal. Tympanic membrane is not erythematous, not retracted and not bulging.  Left Ear: Hearing, tympanic membrane, external ear and ear canal normal. Tympanic membrane is not erythematous, not retracted and not bulging.  Nose: No mucosal edema or rhinorrhea. Right sinus exhibits no maxillary sinus tenderness and no frontal sinus tenderness. Left sinus exhibits no maxillary sinus tenderness and no frontal sinus tenderness.  Mouth/Throat: Uvula is midline, oropharynx is clear and moist and mucous membranes are normal.  Eyes: Conjunctivae, EOM and lids are normal. Pupils are equal, round, and reactive to light. Lids are everted and swept, no foreign bodies found.  Neck: Trachea normal and  normal range of motion. Neck supple. Carotid bruit is not present. No thyroid mass and no thyromegaly present.  Cardiovascular: Normal rate, regular rhythm, S1 normal, S2 normal, normal heart sounds, intact distal pulses and normal pulses.  Exam reveals no gallop and no friction rub.   No murmur heard. Pulmonary/Chest: Effort normal and breath sounds normal. No tachypnea. No respiratory distress. She has no decreased breath sounds. She has no wheezes. She has no rhonchi. She has no rales.  Abdominal: Soft. Normal appearance and bowel sounds are normal. There is no tenderness.  Neurological: She is alert.  Skin: Skin is warm, dry and intact. No rash noted.  Psychiatric: Her speech is normal and behavior is normal. Thought content normal. Her mood appears not anxious. She does not exhibit a depressed mood.          Assessment & Plan:

## 2014-11-24 ENCOUNTER — Encounter: Payer: Self-pay | Admitting: Internal Medicine

## 2014-11-24 ENCOUNTER — Ambulatory Visit (INDEPENDENT_AMBULATORY_CARE_PROVIDER_SITE_OTHER): Payer: Medicare Other | Admitting: Internal Medicine

## 2014-11-24 VITALS — BP 134/74 | HR 85 | Temp 98.3°F | Wt 204.0 lb

## 2014-11-24 DIAGNOSIS — H1089 Other conjunctivitis: Secondary | ICD-10-CM | POA: Diagnosis not present

## 2014-11-24 DIAGNOSIS — A499 Bacterial infection, unspecified: Secondary | ICD-10-CM | POA: Diagnosis not present

## 2014-11-24 DIAGNOSIS — H109 Unspecified conjunctivitis: Secondary | ICD-10-CM

## 2014-11-24 MED ORDER — POLYMYXIN B-TRIMETHOPRIM 10000-0.1 UNIT/ML-% OP SOLN: 1.0000 [drp] | OPHTHALMIC | Status: DC

## 2014-11-24 NOTE — Patient Instructions (Addendum)
I have called in eye drops to put in her right eye, every 4 hours while awake Warm compresses for 15 minutes 3 x day   Bacterial Conjunctivitis Bacterial conjunctivitis, commonly called pink eye, is an inflammation of the clear membrane that covers the white part of the eye (conjunctiva). The inflammation can also happen on the underside of the eyelids. The blood vessels in the conjunctiva become inflamed, causing the eye to become red or pink. Bacterial conjunctivitis may spread easily from one eye to another and from person to person (contagious).  CAUSES  Bacterial conjunctivitis is caused by bacteria. The bacteria may come from your own skin, your upper respiratory tract, or from someone else with bacterial conjunctivitis. SYMPTOMS  The normally white color of the eye or the underside of the eyelid is usually pink or red. The pink eye is usually associated with irritation, tearing, and some sensitivity to light. Bacterial conjunctivitis is often associated with a thick, yellowish discharge from the eye. The discharge may turn into a crust on the eyelids overnight, which causes your eyelids to stick together. If a discharge is present, there may also be some blurred vision in the affected eye. DIAGNOSIS  Bacterial conjunctivitis is diagnosed by your caregiver through an eye exam and the symptoms that you report. Your caregiver looks for changes in the surface tissues of your eyes, which may point to the specific type of conjunctivitis. A sample of any discharge may be collected on a cotton-tip swab if you have a severe case of conjunctivitis, if your cornea is affected, or if you keep getting repeat infections that do not respond to treatment. The sample will be sent to a lab to see if the inflammation is caused by a bacterial infection and to see if the infection will respond to antibiotic medicines. TREATMENT   Bacterial conjunctivitis is treated with antibiotics. Antibiotic eyedrops are most  often used. However, antibiotic ointments are also available. Antibiotics pills are sometimes used. Artificial tears or eye washes may ease discomfort. HOME CARE INSTRUCTIONS   To ease discomfort, apply a cool, clean washcloth to your eye for 10-20 minutes, 3-4 times a day.  Gently wipe away any drainage from your eye with a warm, wet washcloth or a cotton ball.  Wash your hands often with soap and water. Use paper towels to dry your hands.  Do not share towels or washcloths. This may spread the infection.  Change or wash your pillowcase every day.  You should not use eye makeup until the infection is gone.  Do not operate machinery or drive if your vision is blurred.  Stop using contact lenses. Ask your caregiver how to sterilize or replace your contacts before using them again. This depends on the type of contact lenses that you use.  When applying medicine to the infected eye, do not touch the edge of your eyelid with the eyedrop bottle or ointment tube. SEEK IMMEDIATE MEDICAL CARE IF:   Your infection has not improved within 3 days after beginning treatment.  You had yellow discharge from your eye and it returns.  You have increased eye pain.  Your eye redness is spreading.  Your vision becomes blurred.  You have a fever or persistent symptoms for more than 2-3 days.  You have a fever and your symptoms suddenly get worse.  You have facial pain, redness, or swelling. MAKE SURE YOU:   Understand these instructions.  Will watch your condition.  Will get help right away if you are  not doing well or get worse.   This information is not intended to replace advice given to you by your health care provider. Make sure you discuss any questions you have with your health care provider.   Document Released: 01/31/2005 Document Revised: 02/21/2014 Document Reviewed: 07/04/2011 Elsevier Interactive Patient Education [employer].

## 2014-11-24 NOTE — Progress Notes (Signed)
Subjective:    Patient ID: [name] [name], female    DOB: 08-07-1933, 81 y.o.   MRN: [id number]  HPI  Pt presents to the clinic today with c/o right eye swelling and redness. She reports this started 3-4 days ago. He has noticed some blurred vision in that eye, but she denies eye pain. Her eye has been watering and draining white mucous. She wakes up with her right eye crusted over. She denies runny nose, sore throat or cough. She has not tried anything OTC. She denies sick contacts.  Review of Systems  Past Medical History  Diagnosis Date  . Diverticulosis   . HTN (hypertension)   . Osteopenia   . Hyperlipidemia   . Breast cancer ([place])   . Alzheimer's dementia     Current Outpatient Prescriptions  Medication Sig Dispense Refill  . atenolol (TENORMIN) 25 MG tablet TAKE 1 TABLET BY MOUTH DAILY 30 tablet 5  . Calcium Carbonate-Vitamin D (CALCIUM 600+D) 600-200 MG-UNIT TABS Take 1 tablet by mouth 2 (two) times daily.     Marland Kitchen galantamine (RAZADYNE) 12 MG tablet Take 1 tablet (12 mg total) by mouth 2 (two) times daily. 60 tablet 11  . lisinopril (PRINIVIL,ZESTRIL) 20 MG tablet Take 1 tablet (20 mg total) by mouth daily. 90 tablet 3  . simvastatin (ZOCOR) 40 MG tablet Take 40 mg by mouth daily.     Marland Kitchen thiamine (VITAMIN B-1) 100 MG tablet Take 100 mg by mouth daily.     No current facility-administered medications for this visit.    Allergies  Allergen Reactions  . Sulfonamide Derivatives     REACTION: rash  . Sulfur     hives    Family History  Problem Relation Age of Onset  . Cancer Brother     Prostate  . Colon cancer Neg Hx   . Alzheimer's disease Mother   . Stroke Father   . Aneurysm Father     Social History   Social History  . Marital Status: Married    Spouse Name: N/A  . Number of Children: N/A  . Years of Education: N/A   Occupational History  . Not on file.   Social History Main Topics  . Smoking status: Former Smoker    Quit date: 02/14/1957  .  Smokeless tobacco: Never Used  . Alcohol Use: No  . Drug Use: No  . Sexual Activity: Not Currently    Birth Control/ Protection: Post-menopausal   Other Topics Concern  . Not on file   Social History Narrative     Constitutional: Denies fever, malaise, fatigue, headache or abrupt weight changes.  HEENT: Pt reports eye redness and drainage. Denies eye pain, ear pain, ringing in the ears, wax buildup, runny nose, nasal congestion, bloody nose, or sore throat. Respiratory: Denies difficulty breathing, shortness of breath, cough or sputum production.   Cardiovascular: Denies chest pain, chest tightness, palpitations or swelling in the hands or feet.   No other specific complaints in a complete review of systems (except as listed in HPI above).     Objective:   Physical Exam   BP 134/74 mmHg  Pulse 85  Temp(Src) 98.3 F (36.8 C) (Oral)  Wt 204 lb (92.534 kg)  SpO2 98% Wt Readings from Last 3 Encounters:  11/24/14 204 lb (92.534 kg)  10/28/14 203 lb (92.08 kg)  10/03/14 205 lb (92.987 kg)    General: Appears her stated age, chronically ill appearing, in NAD. HEENT: Head: normal shape and size;  Right Eyes: Periorbital edema noted. Sclera injected. White drainage noted in conjunctival sac. Conjunctiva erythematous. Neck:  No lymphadenopathy noted. Cardiovascular: Normal rate and rhythm.  Pulmonary/Chest: Normal effort and positive vesicular breath sounds. No respiratory distress. No wheezes, rales or ronchi noted.   BMET    Component Value Date/Time   NA 142 10/28/2014 1012   NA 139 01/02/2014 1006   NA 141 12/16/2009 1052   K 4.2 10/28/2014 1012   K 4.6 01/02/2014 1006   K 4.4 12/16/2009 1052   CL 103 10/28/2014 1012   CL 104 12/15/2011 1132   CL 101 12/16/2009 1052   CO2 33* 10/28/2014 1012   CO2 29 01/02/2014 1006   CO2 30 12/16/2009 1052   GLUCOSE 95 10/28/2014 1012   GLUCOSE 99 01/02/2014 1006   GLUCOSE 125* 12/15/2011 1132   GLUCOSE 103 12/16/2009 1052    BUN 10 10/28/2014 1012   BUN 13.9 01/02/2014 1006   BUN 16 12/16/2009 1052   CREATININE 0.69 10/28/2014 1012   CREATININE 0.7 01/02/2014 1006   CREATININE 0.64 02/05/2013 1606   CALCIUM 9.5 10/28/2014 1012   CALCIUM 9.4 01/02/2014 1006   CALCIUM 9.4 12/16/2009 1052   GFRNONAA 117.39 07/23/2009 0821   GFRAA  07/15/2008 1130    >60        The eGFR has been calculated using the MDRD equation. This calculation has not been validated in all clinical situations. eGFR's persistently <60 mL/min signify possible Chronic Kidney Disease.    Lipid Panel     Component Value Date/Time   CHOL 212* 02/05/2013 1606   TRIG 130 02/05/2013 1606   HDL 58 02/05/2013 1606   CHOLHDL 3.7 02/05/2013 1606   VLDL 26 02/05/2013 1606   LDLCALC 128* 02/05/2013 1606    CBC    Component Value Date/Time   WBC 10.8* 01/30/2014 1507   WBC 10.5* 01/02/2014 1006   WBC 5.6 12/16/2009 1052   RBC 4.30 01/30/2014 1507   RBC 4.25 01/02/2014 1006   RBC 3.92 12/16/2009 1052   HGB 13.6 01/30/2014 1507   HGB 13.3 01/02/2014 1006   HGB 12.8 12/16/2009 1052   HCT 41.1 01/30/2014 1507   HCT 40.3 01/02/2014 1006   HCT 37.1 12/16/2009 1052   PLT 259.0 01/30/2014 1507   PLT 220 01/02/2014 1006   PLT 249 12/16/2009 1052   MCV 95.4 01/30/2014 1507   MCV 94.8 01/02/2014 1006   MCV 95 12/16/2009 1052   MCH 31.3 01/02/2014 1006   MCH 32.0 02/05/2013 1606   MCH 32.7 12/16/2009 1052   MCHC 33.1 01/30/2014 1507   MCHC 33.0 01/02/2014 1006   MCHC 34.5 12/16/2009 1052   RDW 14.0 01/30/2014 1507   RDW 12.8 01/02/2014 1006   RDW 11.3 12/16/2009 1052   LYMPHSABS 2.9 01/30/2014 1507   LYMPHSABS 2.4 01/02/2014 1006   LYMPHSABS 1.5 12/16/2009 1052   MONOABS 0.8 01/30/2014 1507   MONOABS 0.7 01/02/2014 1006   EOSABS 0.2 01/30/2014 1507   EOSABS 0.1 01/02/2014 1006   EOSABS 0.1 12/16/2009 1052   BASOSABS 0.1 01/30/2014 1507   BASOSABS 0.0 01/02/2014 1006   BASOSABS 0.0 12/16/2009 1052    Hgb A1C Lab Results    Component Value Date   HGBA1C 5.8* 02/05/2013        Assessment & Plan:   Bacterial Conjunctivitis, right eye:  Warm compresses TID eRx for Polytrim eye drops Q4H while awake, x 5 days  RTC as needed or if symptoms persist or worsen

## 2014-11-24 NOTE — Progress Notes (Signed)
Pre visit review using our clinic review tool, if applicable. No additional management support is needed unless otherwise documented below in the visit note. 

## 2014-12-30 ENCOUNTER — Other Ambulatory Visit: Payer: Medicare Other

## 2014-12-30 ENCOUNTER — Telehealth: Payer: Self-pay | Admitting: Hematology and Oncology

## 2014-12-30 ENCOUNTER — Ambulatory Visit: Payer: Medicare Other | Admitting: Hematology and Oncology

## 2014-12-30 ENCOUNTER — Other Ambulatory Visit: Payer: Self-pay

## 2014-12-30 NOTE — Telephone Encounter (Signed)
Called and rescheduled missed appointments

## 2014-12-30 NOTE — Assessment & Plan Note (Deleted)
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 Her husband arranges all her medications  Breast Cancer Surveillance: 1. Breast exam 12/30/14: Normal 2. Mammogram May 2016 No abnormalities. Postsurgical changes. Breast Density Category . I recommended that she get 3-D mammograms for surveillance. Discussed the differences between different breast density categories.   Return to clinic in 1 year with survivorship nurse practitioner.

## 2015-01-06 ENCOUNTER — Other Ambulatory Visit: Payer: Self-pay | Admitting: *Deleted

## 2015-01-06 DIAGNOSIS — D0590 Unspecified type of carcinoma in situ of unspecified breast: Secondary | ICD-10-CM

## 2015-01-06 NOTE — Assessment & Plan Note (Signed)
  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 Her husband arranges all her medications  Breast Cancer Surveillance: 1. Breast exam : Normal 2. Mammogram May 2016 No abnormalities. Postsurgical changes. Breast Density Category . I recommended that she get 3-D mammograms for surveillance. Discussed the differences between different breast density categories.   Return to clinic in 1 year with survivorship nurse practitioner.

## 2015-01-07 ENCOUNTER — Encounter: Payer: Self-pay | Admitting: Hematology and Oncology

## 2015-01-07 ENCOUNTER — Ambulatory Visit (HOSPITAL_BASED_OUTPATIENT_CLINIC_OR_DEPARTMENT_OTHER): Payer: Medicare Other | Admitting: Hematology and Oncology

## 2015-01-07 ENCOUNTER — Other Ambulatory Visit (HOSPITAL_BASED_OUTPATIENT_CLINIC_OR_DEPARTMENT_OTHER): Payer: Medicare Other

## 2015-01-07 VITALS — BP 137/70 | HR 73 | Temp 98.6°F | Resp 18 | Ht 64.5 in | Wt 205.0 lb

## 2015-01-07 DIAGNOSIS — D0590 Unspecified type of carcinoma in situ of unspecified breast: Secondary | ICD-10-CM | POA: Diagnosis present

## 2015-01-07 DIAGNOSIS — Z86000 Personal history of in-situ neoplasm of breast: Secondary | ICD-10-CM | POA: Diagnosis not present

## 2015-01-07 DIAGNOSIS — D0512 Intraductal carcinoma in situ of left breast: Secondary | ICD-10-CM

## 2015-01-07 DIAGNOSIS — F0280 Dementia in other diseases classified elsewhere without behavioral disturbance: Secondary | ICD-10-CM

## 2015-01-07 DIAGNOSIS — G309 Alzheimer's disease, unspecified: Secondary | ICD-10-CM

## 2015-01-07 LAB — COMPREHENSIVE METABOLIC PANEL (CC13)
ALT: 12 U/L (ref 0–55)
AST: 12 U/L (ref 5–34)
Albumin: 3.3 g/dL — ABNORMAL LOW (ref 3.5–5.0)
Alkaline Phosphatase: 101 U/L (ref 40–150)
Anion Gap: 7 mEq/L (ref 3–11)
BUN: 12.8 mg/dL (ref 7.0–26.0)
CHLORIDE: 102 meq/L (ref 98–109)
CO2: 29 meq/L (ref 22–29)
CREATININE: 0.8 mg/dL (ref 0.6–1.1)
Calcium: 10 mg/dL (ref 8.4–10.4)
EGFR: 69 mL/min/{1.73_m2} — ABNORMAL LOW (ref >90)
GLUCOSE: 168 mg/dL — AB (ref 70–140)
Potassium: 4.3 mEq/L (ref 3.5–5.1)
Sodium: 138 mEq/L (ref 136–145)
TOTAL PROTEIN: 7 g/dL (ref 6.4–8.3)

## 2015-01-07 LAB — CBC WITH DIFFERENTIAL/PLATELET
BASO%: 0.2 % (ref 0.0–2.0)
Basophils Absolute: 0 10*3/uL (ref 0.0–0.1)
EOS%: 1.5 % (ref 0.0–7.0)
Eosinophils Absolute: 0.2 10*3/uL (ref 0.0–0.5)
HCT: 42.8 % (ref 34.8–46.6)
HGB: 14.1 g/dL (ref 11.6–15.9)
LYMPH%: 21.9 % (ref 14.0–49.7)
MCH: 31.7 pg (ref 25.1–34.0)
MCHC: 32.9 g/dL (ref 31.5–36.0)
MCV: 96.2 fL (ref 79.5–101.0)
MONO#: 0.8 10*3/uL (ref 0.1–0.9)
MONO%: 7.4 % (ref 0.0–14.0)
NEUT%: 69 % (ref 38.4–76.8)
NEUTROS ABS: 7.3 10*3/uL — AB (ref 1.5–6.5)
Platelets: 276 10*3/uL (ref 145–400)
RBC: 4.45 10*6/uL (ref 3.70–5.45)
RDW: 13 % (ref 11.2–14.5)
WBC: 10.6 10*3/uL — AB (ref 3.9–10.3)
lymph#: 2.3 10*3/uL (ref 0.9–3.3)

## 2015-01-07 NOTE — Progress Notes (Signed)
Patient Care Team: Jinny Sanders, MD as PCP - General  DIAGNOSIS:DCIS of left breast status post lumpectomy June 2010  PRIOR THERAPY:  #1 status post left breast localized lumpectomy on 07/17/2008 with the final pathology revealing intermediate grade DCIS with necrosis and microcalcifications measuring 1.0 cm. Tumor was ER +100%.  #2 patient underwent radiation therapy to the left breast from 09/11/2008 to 10/09/2008.  #3 she was then begun on tamoxifen 20 mg starting in August 2010. Completed 01/02/2014  CURRENT THERAPY:observation  CHIEF COMPLIANT: followup of DCIS   INTERVAL HISTORY: [name] [name] is a 81 year old with above-mentioned history of DCIS left breast with completed 5 years of tamoxifen therapy and is currently on observation. She is here for annual follow-up. She reports no new problems or concerns with her breasts. She had a discussion with her primary care physician and they decided that she will not need any further mammograms. She has dementia and her husband takes care of her.  REVIEW OF SYSTEMS:   Constitutional: Denies fevers, chills or abnormal weight loss Eyes: Denies blurriness of vision Ears, nose, mouth, throat, and face: Denies mucositis or sore throat Respiratory: Denies cough, dyspnea or wheezes Cardiovascular: Denies palpitation, chest discomfort or lower extremity swelling Gastrointestinal:  Denies nausea, heartburn or change in bowel habits Skin: Denies abnormal skin rashes Lymphatics: Denies new lymphadenopathy or easy bruising Neurological:Denies numbness, tingling or new weaknesses Behavioral/Psych: Pleasantly demented  Breast:  denies any pain or lumps or nodules in either breasts All other systems were reviewed with the patient and are negative.  I have reviewed the past medical history, past surgical history, social history and family history with the patient and they are unchanged from previous note.  ALLERGIES:  is allergic to  sulfonamide derivatives and sulfur.  MEDICATIONS:  Current Outpatient Prescriptions  Medication Sig Dispense Refill  . atenolol (TENORMIN) 25 MG tablet TAKE 1 TABLET BY MOUTH DAILY 30 tablet 5  . Calcium Carbonate-Vitamin D (CALCIUM 600+D) 600-200 MG-UNIT TABS Take 1 tablet by mouth 2 (two) times daily.     Marland Kitchen galantamine (RAZADYNE) 12 MG tablet Take 1 tablet (12 mg total) by mouth 2 (two) times daily. 60 tablet 11  . lisinopril (PRINIVIL,ZESTRIL) 20 MG tablet Take 1 tablet (20 mg total) by mouth daily. 90 tablet 3  . simvastatin (ZOCOR) 40 MG tablet Take 40 mg by mouth daily.     Marland Kitchen thiamine (VITAMIN B-1) 100 MG tablet Take 100 mg by mouth daily.    Marland Kitchen trimethoprim-polymyxin b (POLYTRIM) ophthalmic solution Place 1 drop into the right eye every 4 (four) hours. 10 mL 0   No current facility-administered medications for this visit.    PHYSICAL EXAMINATION: ECOG PERFORMANCE STATUS: 2 - Symptomatic, <50% confined to bed  Filed Vitals:   01/07/15 1015  BP: 137/70  Pulse: 73  Temp: 98.6 F (37 C)  Resp: 18   Filed Weights   01/07/15 1015  Weight: 205 lb (92.987 kg)    GENERAL:alert, no distress and comfortable SKIN: skin color, texture, turgor are normal, no rashes or significant lesions EYES: normal, Conjunctiva are pink and non-injected, sclera clear OROPHARYNX:no exudate, no erythema and lips, buccal mucosa, and tongue normal  NECK: supple, thyroid normal size, non-tender, without nodularity LYMPH:  no palpable lymphadenopathy in the cervical, axillary or inguinal LUNGS: clear to auscultation and percussion with normal breathing effort HEART: regular rate & rhythm and no murmurs and no lower extremity edema ABDOMEN:abdomen soft, non-tender and normal bowel sounds Musculoskeletal:no cyanosis  of digits and no clubbing  NEURO: alert & oriented x 3 with fluent speech, no focal motor/sensory deficits BREAST: No palpable masses or nodules in either right or left breasts. Scars noted  in the left breast No palpable axillary supraclavicular or infraclavicular adenopathy no breast tenderness or nipple discharge. (exam performed in the presence of a chaperone)  LABORATORY DATA:  I have reviewed the data as listed   Chemistry      Component Value Date/Time   NA 138 01/07/2015 0946   NA 142 10/28/2014 1012   NA 141 12/16/2009 1052   K 4.3 01/07/2015 0946   K 4.2 10/28/2014 1012   K 4.4 12/16/2009 1052   CL 103 10/28/2014 1012   CL 104 12/15/2011 1132   CL 101 12/16/2009 1052   CO2 29 01/07/2015 0946   CO2 33* 10/28/2014 1012   CO2 30 12/16/2009 1052   BUN 12.8 01/07/2015 0946   BUN 10 10/28/2014 1012   BUN 16 12/16/2009 1052   CREATININE 0.8 01/07/2015 0946   CREATININE 0.69 10/28/2014 1012   CREATININE 0.64 02/05/2013 1606      Component Value Date/Time   CALCIUM 10.0 01/07/2015 0946   CALCIUM 9.5 10/28/2014 1012   CALCIUM 9.4 12/16/2009 1052   ALKPHOS 101 01/07/2015 0946   ALKPHOS 80 09/30/2014 0754   ALKPHOS 48 12/16/2009 1052   AST 12 01/07/2015 0946   AST 14 09/30/2014 0754   AST 18 12/16/2009 1052   ALT 12 01/07/2015 0946   ALT 12 09/30/2014 0754   ALT 19 12/16/2009 1052   BILITOT <0.30 01/07/2015 0946   BILITOT 0.4 09/30/2014 0754   BILITOT 0.50 12/16/2009 1052       Lab Results  Component Value Date   WBC 10.6* 01/07/2015   HGB 14.1 01/07/2015   HCT 42.8 01/07/2015   MCV 96.2 01/07/2015   PLT 276 01/07/2015   NEUTROABS 7.3* 01/07/2015    ASSESSMENT & PLAN:  Ductal carcinoma in situ (DCIS) of left breast  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 Her husband arranges all her medications  Breast Cancer Surveillance: 1. Breast exam : Normal 2. Mammogram: Because of patient's age and her comorbidities, patient and her primary  care physician decided that she does not need any further mammograms. So we will not be ordering any further breast imaging. At today's breast exam was normal.   I discussed with her about further follow-ups. She could be followed by her primary care physician once a year or she could come see [place] once a year for breast exams and follow-ups.  Patient decided that she would like to follow with her primary care physician and will call us if there was any concern for recurrent breast cancer in the future. She'll be seen on an as-needed basis.  No orders of the defined types were placed in this encounter.   The patient has a good understanding of the overall plan. she agrees with it. she will call with any problems that may develop before the next visit here.   Rulon Eisenmenger, MD 01/07/2015

## 2015-03-10 ENCOUNTER — Telehealth: Payer: Self-pay | Admitting: Family Medicine

## 2015-03-10 DIAGNOSIS — Z7689 Persons encountering health services in other specified circumstances: Secondary | ICD-10-CM

## 2015-03-10 NOTE — Telephone Encounter (Signed)
Daughter dropped off [name] [name] form, she is also asking about a tb test.  cb number is [phone] Please call back and to let her know when ready  Placing in rx tower

## 2015-03-10 NOTE — Telephone Encounter (Signed)
FL-2 Form placed in Dr. Rometta Emery in box to complete.

## 2015-03-10 NOTE — Telephone Encounter (Signed)
Completed.. Left top( 10/11) blank so we can get info from family to complete correctly.

## 2015-03-11 NOTE — Telephone Encounter (Signed)
Top section completed after talking to [place].  Herbie Baltimore (son) notified FL-2 form is ready to be picked up.  Appointment for PPD scheduled for 03/13/2015 at 10 am.

## 2015-03-13 ENCOUNTER — Other Ambulatory Visit: Payer: Self-pay | Admitting: Family Medicine

## 2015-03-13 ENCOUNTER — Ambulatory Visit (INDEPENDENT_AMBULATORY_CARE_PROVIDER_SITE_OTHER): Payer: Medicare Other

## 2015-03-13 DIAGNOSIS — Z111 Encounter for screening for respiratory tuberculosis: Secondary | ICD-10-CM

## 2015-03-16 ENCOUNTER — Encounter: Payer: Self-pay | Admitting: *Deleted

## 2015-03-16 LAB — TB SKIN TEST
Induration: 0 mm
TB Skin Test: NEGATIVE

## 2015-04-07 ENCOUNTER — Telehealth: Payer: Self-pay | Admitting: Family Medicine

## 2015-04-07 ENCOUNTER — Ambulatory Visit: Payer: Medicare Other | Admitting: Family Medicine

## 2015-04-07 NOTE — Telephone Encounter (Signed)
Patient did not come for their scheduled appointment today for 6 month follow up Please let me know if the patient needs to be contacted immediately for follow up or if no follow up is necessary.   ° °

## 2015-04-07 NOTE — Telephone Encounter (Signed)
Pt recently moved to assisted living.  Call pt to see if MD at home going to follow care, if not needs follow up at least in next few months.

## 2015-04-14 NOTE — Telephone Encounter (Signed)
Spoke to spouse pt will be coming home Sunday and he will call back to r/s appointment when everyone gets back to a normal routine

## 2015-04-23 ENCOUNTER — Ambulatory Visit (INDEPENDENT_AMBULATORY_CARE_PROVIDER_SITE_OTHER): Payer: Medicare Other | Admitting: Family Medicine

## 2015-04-23 ENCOUNTER — Encounter: Payer: Self-pay | Admitting: Family Medicine

## 2015-04-23 VITALS — BP 144/80 | HR 61 | Temp 98.2°F | Ht 64.5 in | Wt 205.8 lb

## 2015-04-23 DIAGNOSIS — F0280 Dementia in other diseases classified elsewhere without behavioral disturbance: Secondary | ICD-10-CM

## 2015-04-23 DIAGNOSIS — L820 Inflamed seborrheic keratosis: Secondary | ICD-10-CM | POA: Diagnosis not present

## 2015-04-23 DIAGNOSIS — G309 Alzheimer's disease, unspecified: Secondary | ICD-10-CM | POA: Diagnosis not present

## 2015-04-23 DIAGNOSIS — I10 Essential (primary) hypertension: Secondary | ICD-10-CM | POA: Diagnosis not present

## 2015-04-23 DIAGNOSIS — M25562 Pain in left knee: Secondary | ICD-10-CM | POA: Insufficient documentation

## 2015-04-23 MED ORDER — DICLOFENAC SODIUM 75 MG PO TBEC: 75.0000 mg | DELAYED_RELEASE_TABLET | Freq: Two times a day (BID) | ORAL | Status: DC

## 2015-04-23 NOTE — Progress Notes (Signed)
Subjective:    Patient ID: [name] [name], female    DOB: February 10, 1934, 82 y.o.   MRN: [id number]  HPI  82 year old female presents for follow up on discharge from  [facility] facility. She was in respite care for alzheimer's while her husband had heart surgery for aortic valve replacement.  She did well there except she has been having left knee pain x several months.  Pain in knee when she walks or pout weight on it, no pain sitting.  No known injury. No known fall. Mild left knee swelling, no redness., no hot.  For several months she has been having issue getting up out of chair.  Using lift to get in and out of chair.  Has home caregiver to help bathe and with ADLs.  She has noted mole on right thigh than has been bleeding some. Pt picks at moles.  She also has one on right upper face.  BP was well controlled at rest home.  BP Readings from Last 3 Encounters:  04/23/15 144/80  01/07/15 137/70  11/24/14 134/74    No med changes or new issues at rehab.  Social History /Family History/Past Medical History reviewed and updated if needed.    Review of Systems  Constitutional: Negative for fever and fatigue.  HENT: Negative for ear pain.   Eyes: Negative for pain.  Respiratory: Negative for chest tightness and shortness of breath.   Cardiovascular: Negative for chest pain, palpitations and leg swelling.  Gastrointestinal: Negative for abdominal pain.  Genitourinary: Negative for dysuria.       Objective:   Physical Exam  Constitutional: Vital signs are normal. She appears well-developed and well-nourished. She is cooperative.  Non-toxic appearance. She does not appear ill. No distress.  HENT:  Head: Normocephalic.  Right Ear: Hearing, tympanic membrane, external ear and ear canal normal. Tympanic membrane is not erythematous, not retracted and not bulging.  Left Ear: Hearing, tympanic membrane, external ear and ear canal normal. Tympanic membrane is not  erythematous, not retracted and not bulging.  Nose: No mucosal edema or rhinorrhea. Right sinus exhibits no maxillary sinus tenderness and no frontal sinus tenderness. Left sinus exhibits no maxillary sinus tenderness and no frontal sinus tenderness.  Mouth/Throat: Uvula is midline, oropharynx is clear and moist and mucous membranes are normal.  Eyes: Conjunctivae, EOM and lids are normal. Pupils are equal, round, and reactive to light. Lids are everted and swept, no foreign bodies found.  Neck: Trachea normal and normal range of motion. Neck supple. Carotid bruit is not present. No thyroid mass and no thyromegaly present.  Cardiovascular: Normal rate, regular rhythm, S1 normal, S2 normal, normal heart sounds, intact distal pulses and normal pulses.  Exam reveals no gallop and no friction rub.   No murmur heard. Pulmonary/Chest: Effort normal and breath sounds normal. No tachypnea. No respiratory distress. She has no decreased breath sounds. She has no wheezes. She has no rhonchi. She has no rales.  Abdominal: Soft. Normal appearance and bowel sounds are normal. There is no tenderness.  Musculoskeletal:       Right knee: She exhibits decreased range of motion. She exhibits no swelling and no effusion. No tenderness found.       Left knee: She exhibits decreased range of motion and swelling. She exhibits no effusion, no bony tenderness, normal meniscus and no MCL laxity. Tenderness found. No medial joint line, no lateral joint line, no MCL, no LCL and no patellar tendon tenderness noted.  Pain mild with flexion and extension. Pt can only extend partially  Neurological: She is alert.  Skin: Skin is warm, dry and intact. No rash noted.  Many SK on face, chest neck, irritate sk on right upper thigh  Psychiatric: Her speech is normal and behavior is normal. Judgment and thought content normal. Her mood appears not anxious. Cognition and memory are normal. She does not exhibit a depressed mood.           Assessment & Plan:

## 2015-04-23 NOTE — Assessment & Plan Note (Signed)
BP slighted today, follow at home.

## 2015-04-23 NOTE — Assessment & Plan Note (Signed)
Stable per family on current meds. Repeat MMSE at year annual.

## 2015-04-23 NOTE — Assessment & Plan Note (Signed)
Consistently with OA. Will treat with 2 weeks of diclofenac. If not improving pt to return for follow up and eval with X-ray.

## 2015-04-23 NOTE — Progress Notes (Signed)
Pre visit review using our clinic review tool, if applicable. No additional management support is needed unless otherwise documented below in the visit note. 

## 2015-04-23 NOTE — Patient Instructions (Signed)
Work on trying to increase strength with getting up an moving as much as possible. Try chair exercises.  Start diclofenac 75 mg twice daily for pain in knee x 2 weeks.. Call if not resolved for further evaluation.  Cover irritate moles with bandaid and neosporin.

## 2015-04-23 NOTE — Assessment & Plan Note (Signed)
Reassured. Keep area covered with Bandaid to avoid pt picking at it. Offered cryotherapy or shave of lesion since irritated, family not interested at this time.

## 2015-07-16 ENCOUNTER — Other Ambulatory Visit: Payer: Self-pay | Admitting: Family Medicine

## 2015-09-16 ENCOUNTER — Telehealth: Payer: Self-pay | Admitting: Family Medicine

## 2015-09-16 DIAGNOSIS — M8580 Other specified disorders of bone density and structure, unspecified site: Secondary | ICD-10-CM

## 2015-09-16 DIAGNOSIS — E7800 Pure hypercholesterolemia, unspecified: Secondary | ICD-10-CM

## 2015-09-16 DIAGNOSIS — E559 Vitamin D deficiency, unspecified: Secondary | ICD-10-CM

## 2015-09-16 DIAGNOSIS — R7303 Prediabetes: Secondary | ICD-10-CM

## 2015-09-16 NOTE — Telephone Encounter (Signed)
-----   Message from Ellamae Sia sent at 09/08/2015  3:37 PM EDT ----- Regarding: Lab orders for Thursday, 8.3.17  AWV lab orders, please.

## 2015-09-17 ENCOUNTER — Other Ambulatory Visit: Payer: Medicare Other

## 2015-09-21 ENCOUNTER — Other Ambulatory Visit (INDEPENDENT_AMBULATORY_CARE_PROVIDER_SITE_OTHER): Payer: Medicare Other

## 2015-09-21 DIAGNOSIS — E559 Vitamin D deficiency, unspecified: Secondary | ICD-10-CM | POA: Diagnosis not present

## 2015-09-21 DIAGNOSIS — E7800 Pure hypercholesterolemia, unspecified: Secondary | ICD-10-CM

## 2015-09-21 DIAGNOSIS — R7303 Prediabetes: Secondary | ICD-10-CM

## 2015-09-21 LAB — COMPREHENSIVE METABOLIC PANEL
ALK PHOS: 75 U/L (ref 39–117)
ALT: 9 U/L (ref 0–35)
AST: 10 U/L (ref 0–37)
Albumin: 3.6 g/dL (ref 3.5–5.2)
BUN: 14 mg/dL (ref 6–23)
CO2: 30 meq/L (ref 19–32)
Calcium: 9.5 mg/dL (ref 8.4–10.5)
Chloride: 102 mEq/L (ref 96–112)
Creatinine, Ser: 0.81 mg/dL (ref 0.40–1.20)
GFR: 72.34 mL/min (ref >60.00)
GLUCOSE: 239 mg/dL — AB (ref 70–99)
POTASSIUM: 4.4 meq/L (ref 3.5–5.1)
Sodium: 138 mEq/L (ref 135–145)
TOTAL PROTEIN: 6.7 g/dL (ref 6.0–8.3)
Total Bilirubin: 0.3 mg/dL (ref 0.2–1.2)

## 2015-09-21 LAB — LIPID PANEL
CHOL/HDL RATIO: 5
Cholesterol: 227 mg/dL — ABNORMAL HIGH (ref 0–200)
HDL: 45 mg/dL (ref >39.00)
NONHDL: 182.43
Triglycerides: 214 mg/dL — ABNORMAL HIGH (ref 0.0–149.0)
VLDL: 42.8 mg/dL — AB (ref 0.0–40.0)

## 2015-09-21 LAB — VITAMIN D 25 HYDROXY (VIT D DEFICIENCY, FRACTURES): VITD: 44.23 ng/mL (ref 30.00–100.00)

## 2015-09-21 LAB — HEMOGLOBIN A1C: Hgb A1c MFr Bld: 6.6 % — ABNORMAL HIGH (ref 4.6–6.5)

## 2015-09-21 LAB — LDL CHOLESTEROL, DIRECT: Direct LDL: 164 mg/dL

## 2015-09-24 ENCOUNTER — Ambulatory Visit (INDEPENDENT_AMBULATORY_CARE_PROVIDER_SITE_OTHER): Payer: Medicare Other

## 2015-09-24 ENCOUNTER — Encounter: Payer: Self-pay | Admitting: Family Medicine

## 2015-09-24 ENCOUNTER — Ambulatory Visit (INDEPENDENT_AMBULATORY_CARE_PROVIDER_SITE_OTHER): Payer: Medicare Other | Admitting: Family Medicine

## 2015-09-24 VITALS — BP 126/70 | HR 74 | Temp 98.4°F | Ht 65.0 in | Wt 208.5 lb

## 2015-09-24 DIAGNOSIS — G309 Alzheimer's disease, unspecified: Secondary | ICD-10-CM

## 2015-09-24 DIAGNOSIS — Z0000 Encounter for general adult medical examination without abnormal findings: Secondary | ICD-10-CM | POA: Diagnosis not present

## 2015-09-24 DIAGNOSIS — E7800 Pure hypercholesterolemia, unspecified: Secondary | ICD-10-CM | POA: Diagnosis not present

## 2015-09-24 DIAGNOSIS — I10 Essential (primary) hypertension: Secondary | ICD-10-CM

## 2015-09-24 DIAGNOSIS — E1140 Type 2 diabetes mellitus with diabetic neuropathy, unspecified: Secondary | ICD-10-CM

## 2015-09-24 DIAGNOSIS — M8580 Other specified disorders of bone density and structure, unspecified site: Secondary | ICD-10-CM

## 2015-09-24 DIAGNOSIS — F0280 Dementia in other diseases classified elsewhere without behavioral disturbance: Secondary | ICD-10-CM

## 2015-09-24 LAB — HM DIABETES FOOT EXAM

## 2015-09-24 MED ORDER — ATORVASTATIN CALCIUM 80 MG PO TABS: 80.0000 mg | ORAL_TABLET | Freq: Every day | ORAL | 11 refills | Status: DC

## 2015-09-24 NOTE — Progress Notes (Signed)
Earlier today she saw Candis Musa, LPN for medicare wellness. Note will be reviewed in detail when completed.  Alzheimer's dementia, moderate:  She refused aricept, namenda and exelon patches,because it made her nauseated, diarrhea.  Stable control on galantamine 12 mg BID. No suggestion of depression.   No wandering, no psychosis.  Hypertension: Good control on lisinopril  on atenolol, lasix. BP Readings from Last 3 Encounters:  09/24/15 126/70  09/24/15 126/70  04/23/15 (!) 144/80  Using medication without problems or lightheadedness: None  Chest pain with exertion:None  Edema:None  Short of breath:None  Average home BPs: not checking  Other issues:   Elevated Cholesterol: Previously LDL  NOT at goal < 130 on zocor 40 mg daily .  Lab Results  Component Value Date   CHOL 227 (H) 09/21/2015   HDL 45.00 09/21/2015   LDLCALC 128 (H) 02/05/2013   LDLDIRECT 164.0 09/21/2015   TRIG 214.0 (H) 09/21/2015   CHOLHDL 5 09/21/2015  Using medications without problems: none Muscle aches: None Diet compliance: healthy, eats a lot of sweets.  Exercise: walks some Other complaints:   Wt Readings from Last 3 Encounters:  09/24/15 208 lb 8 oz (94.6 kg)  09/24/15 208 lb 8 oz (94.6 kg)  04/23/15 205 lb 12 oz (93.3 kg)  Body mass index is 34.7 kg/m.    New diagnosis diabetes.. Had  Breakfast that AM.. CBG was still > 200. Not requiring medication... Eating lots of cookies and juice every mrning. Lab Results  Component Value Date   HGBA1C 6.6 (H) 09/21/2015  Peripheral neuropathy: stable, no burning.  Social History /Family History/Past Medical History reviewed and updated if needed.  Review of Systems  Constitutional: Negative for fever, fatigue and unexpected weight change.  HENT: Negative for ear pain, congestion, sore throat, sneezing, trouble swallowing and sinus pressure.  Eyes: Negative for pain and itching.  Respiratory: Negative for cough, shortness of  breath and wheezing.  Cardiovascular: Negative for chest pain, palpitations and leg swelling.  Gastrointestinal: Negative for nausea, abdominal pain, diarrhea, constipation and blood in stool.  Genitourinary: Negative for dysuria, hematuria, vaginal discharge and difficulty urinating.  Skin: Negative for rash.  Neurological: No numbness. Negative for syncope, weakness, light-headedness and headaches.  Psychiatric/Behavioral: Positive for confusion. Negative for dysphoric mood. The patient is not nervous/anxious.  Objective:   Physical Exam  Constitutional: Vital signs are normal. She appears well-developed and well-nourished. She is cooperative. Non-toxic appearance. She does not appear ill. No distress.  HENT:  Head: Normocephalic.  Right Ear: Hearing, tympanic membrane, external ear and ear canal normal.  Left Ear: Hearing, tympanic membrane, external ear and ear canal normal.  Nose: Nose normal.  Eyes: Conjunctivae normal, EOM and lids are normal. Pupils are equal, round, and reactive to light. No foreign bodies found.  Neck: Trachea normal and normal range of motion. Neck supple. Carotid bruit is not present. No mass and no thyromegaly present.  Cardiovascular: Normal rate, regular rhythm, S1 normal, S2 normal, normal heart sounds and intact distal pulses. Exam reveals no gallop and no friction rub.  No murmur heard.  Pulmonary/Chest: Effort normal and breath sounds normal. No respiratory distress. She has no wheezes. She has no rhonchi. She has no rales. She exhibits no tenderness.  Abdominal: Soft. Normal appearance and bowel sounds are normal. She exhibits no distension, no fluid wave, no abdominal bruit and no mass. There is no hepatosplenomegaly. There is no tenderness. There is no rebound, no guarding and no CVA tenderness. No hernia.  Lymphadenopathy:  She has no cervical adenopathy.  She has no axillary adenopathy.  Neurological: She is alert. She has normal  strength. No cranial nerve deficit or sensory deficit. Coordination normal.  Skin: Skin is warm, dry and intact. No rash noted.  Psychiatric: She has a normal mood and affect. Her speech is normal and behavior is normal. Judgment normal. Her mood appears not anxious. She does not exhibit a depressed mood. She exhibits abnormal recent memory.  Diabetic foot exam: Normal inspection No skin breakdown No calluses  Normal DP pulses Normal sensation to light touch and monofilament Nails normal   Assessment & Plan:   The patient's preventative maintenance and recommended screening tests for an annual wellness exam were reviewed in full today.  Brought up to date unless services declined.  Counselled on the importance of diet, exercise, and its role in overall health and mortality.  The patient's FH and SH was reviewed, including their home life, tobacco status, and drug and alcohol status.    Vaccines:uptodate PNA and td, refused shingles. DVE/pap: not indicated  DEXA: 06/2012 osteopenia stable from last check, recheck in 5 years. Mammo: hx of breast cancer, 05/2013 mammo stable, followed by ONC. Colon: 2013 sessile polyp Dr. Fuller Plan, no further indicated due to age. Former smoker, minimal use in past, asymptomatic.

## 2015-09-24 NOTE — Assessment & Plan Note (Addendum)
Moderately stable control on current dose of galantamine. Occ agitated at night. Husband is dealing with this well without meds. She is not wandering.

## 2015-09-24 NOTE — Progress Notes (Signed)
Pre visit review using our clinic review tool, if applicable. No additional management support is needed unless otherwise documented below in the visit note. 

## 2015-09-24 NOTE — Progress Notes (Signed)
I reviewed health advisor's note, was available for consultation, and agree with documentation and plan.  Amy Bedsole, MD [facility]  

## 2015-09-24 NOTE — Patient Instructions (Addendum)
Decrease cookies and OJ as able. Decrease sugar and carbohydrates in general.  Stop simvastatin and change to atorvastatin 80 mg daily.

## 2015-09-24 NOTE — Patient Instructions (Addendum)
[name] [name] , Thank you for taking time to come for your Medicare Wellness Visit. I appreciate your ongoing commitment to your health goals. Please review the following plan we discussed and let me know if I can assist you in the future.   These are the goals we discussed: Goals    . Increase water intake          Starting 09/24/2015, I will continue to drink at least 6-8 glasses of water daily.       This is a list of the screening recommended for you and due dates:  Health Maintenance  Topic Date Due  . Flu Shot  09/23/2028*  . Shingles Vaccine  09/23/2028*  . Tetanus Vaccine  01/23/2018  . DEXA scan (bone density measurement)  Completed  . Pneumonia vaccines  Completed  *Topic was postponed. The date shown is not the original due date.   Preventive Care for Adults  A healthy lifestyle and preventive care can promote health and wellness. Preventive health guidelines for adults include the following key practices.  . A routine yearly physical is a good way to check with your health care provider about your health and preventive screening. It is a chance to share any concerns and updates on your health and to receive a thorough exam.  . Visit your dentist for a routine exam and preventive care every 6 months. Brush your teeth twice a day and floss once a day. Good oral hygiene prevents tooth decay and gum disease.  . The frequency of eye exams is based on your age, health, family medical history, use  of contact lenses, and other factors. Follow your health care provider's ecommendations for frequency of eye exams.  . Eat a healthy diet. Foods like vegetables, fruits, whole grains, low-fat dairy products, and lean protein foods contain the nutrients you need without too many calories. Decrease your intake of foods high in solid fats, added sugars, and salt. Eat the right amount of calories for you. Get information about a proper diet from your health care provider, if necessary.  .  Regular physical exercise is one of the most important things you can do for your health. Most adults should get at least 150 minutes of moderate-intensity exercise (any activity that increases your heart rate and causes you to sweat) each week. In addition, most adults need muscle-strengthening exercises on 2 or more days a week.  Silver Sneakers may be a benefit available to you. To determine eligibility, you may visit the website: www.silversneakers.com or contact program at 804-280-0209 Mon-Fri between 8AM-8PM.   . Maintain a healthy weight. The body mass index (BMI) is a screening tool to identify possible weight problems. It provides an estimate of body fat based on height and weight. Your health care provider can find your BMI and can help you achieve or maintain a healthy weight.   For adults 20 years and older: ? A BMI below 18.5 is considered underweight. ? A BMI of 18.5 to 24.9 is normal. ? A BMI of 25 to 29.9 is considered overweight. ? A BMI of 30 and above is considered obese.   . Maintain normal blood lipids and cholesterol levels by exercising and minimizing your intake of saturated fat. Eat a balanced diet with plenty of fruit and vegetables. Blood tests for lipids and cholesterol should begin at age 58 and be repeated every 5 years. If your lipid or cholesterol levels are high, you are over 50, or you are at high  risk for heart disease, you may need your cholesterol levels checked more frequently. Ongoing high lipid and cholesterol levels should be treated with medicines if diet and exercise are not working.  . If you smoke, find out from your health care provider how to quit. If you do not use tobacco, please do not start.  . If you choose to drink alcohol, please do not consume more than 2 drinks per day. One drink is considered to be 12 ounces (355 mL) of beer, 5 ounces (148 mL) of wine, or 1.5 ounces (44 mL) of liquor.  . If you are 3-64 years old, ask your health care  provider if you should take aspirin to prevent strokes.  . Use sunscreen. Apply sunscreen liberally and repeatedly throughout the day. You should seek shade when your shadow is shorter than you. Protect yourself by wearing long sleeves, pants, a wide-brimmed hat, and sunglasses year round, whenever you are outdoors.  . Once a month, do a whole body skin exam, using a mirror to look at the skin on your back. Tell your health care provider of new moles, moles that have irregular borders, moles that are larger than a pencil eraser, or moles that have changed in shape or color.   Fall Prevention in the Home  Falls can cause injuries. They can happen to people of all ages. There are many things you can do to make your home safe and to help prevent falls.  WHAT CAN I DO ON THE OUTSIDE OF MY HOME?  Regularly fix the edges of walkways and driveways and fix any cracks.  Remove anything that might make you trip as you walk through a door, such as a raised step or threshold.  Trim any bushes or trees on the path to your home.  Use bright outdoor lighting.  Clear any walking paths of anything that might make someone trip, such as rocks or tools.  Regularly check to see if handrails are loose or broken. Make sure that both sides of any steps have handrails.  Any raised decks and porches should have guardrails on the edges.  Have any leaves, snow, or ice cleared regularly.  Use sand or salt on walking paths during winter.  Clean up any spills in your garage right away. This includes oil or grease spills. WHAT CAN I DO IN THE BATHROOM?   Use night lights.  Install grab bars by the toilet and in the tub and shower. Do not use towel bars as grab bars.  Use non-skid mats or decals in the tub or shower.  If you need to sit down in the shower, use a plastic, non-slip stool.  Keep the floor dry. Clean up any water that spills on the floor as soon as it happens.  Remove soap buildup in the tub or  shower regularly.  Attach bath mats securely with double-sided non-slip rug tape.  Do not have throw rugs and other things on the floor that can make you trip. WHAT CAN I DO IN THE BEDROOM?  Use night lights.  Make sure that you have a light by your bed that is easy to reach.  Do not use any sheets or blankets that are too big for your bed. They should not hang down onto the floor.  Have a firm chair that has side arms. You can use this for support while you get dressed.  Do not have throw rugs and other things on the floor that can make you trip.  WHAT CAN I DO IN THE KITCHEN?  Clean up any spills right away.  Avoid walking on wet floors.  Keep items that you use a lot in easy-to-reach places.  If you need to reach something above you, use a strong step stool that has a grab bar.  Keep electrical cords out of the way.  Do not use floor polish or wax that makes floors slippery. If you must use wax, use non-skid floor wax.  Do not have throw rugs and other things on the floor that can make you trip. WHAT CAN I DO WITH MY STAIRS?  Do not leave any items on the stairs.  Make sure that there are handrails on both sides of the stairs and use them. Fix handrails that are broken or loose. Make sure that handrails are as long as the stairways.  Check any carpeting to make sure that it is firmly attached to the stairs. Fix any carpet that is loose or worn.  Avoid having throw rugs at the top or bottom of the stairs. If you do have throw rugs, attach them to the floor with carpet tape.  Make sure that you have a light switch at the top of the stairs and the bottom of the stairs. If you do not have them, ask someone to add them for you. WHAT ELSE CAN I DO TO HELP PREVENT FALLS?  Wear shoes that:  Do not have high heels.  Have rubber bottoms.  Are comfortable and fit you well.  Are closed at the toe. Do not wear sandals.  If you use a stepladder:  Make sure that it is fully  opened. Do not climb a closed stepladder.  Make sure that both sides of the stepladder are locked into place.  Ask someone to hold it for you, if possible.  Clearly mark and make sure that you can see:  Any grab bars or handrails.  First and last steps.  Where the edge of each step is.  Use tools that help you move around (mobility aids) if they are needed. These include:  Canes.  Walkers.  Scooters.  Crutches.  Turn on the lights when you go into a dark area. Replace any light bulbs as soon as they burn out.  Set up your furniture so you have a clear path. Avoid moving your furniture around.  If any of your floors are uneven, fix them.  If there are any pets around you, be aware of where they are.  Review your medicines with your doctor. Some medicines can make you feel dizzy. This can increase your chance of falling. Ask your doctor what other things that you can do to help prevent falls.   This information is not intended to replace advice given to you by your health care provider. Make sure you discuss any questions you have with your health care provider.   Document Released: 11/27/2008 Document Revised: 06/17/2014 Document Reviewed: 03/07/2014 Elsevier Interactive Patient Education [employer].

## 2015-09-24 NOTE — Progress Notes (Signed)
PCP notes:   Health maintenance:  Shingles - declined Flu - declined Eye exam - declined Foot exam - will be completed at next PCP appt  Abnormal screenings:   Hearing - failed Depression - PHQ2 score: 1 Fall risk - hx of fall without injury  Patient concerns:   None  Nurse concerns:  None  Next PCP appt:   09/24/2015 @ 1400

## 2015-09-24 NOTE — Assessment & Plan Note (Signed)
Stable control with diet.  

## 2015-09-24 NOTE — Assessment & Plan Note (Signed)
Not at goal on zocor 40 mg daily. Poor diet control.

## 2015-09-24 NOTE — Progress Notes (Signed)
Subjective:   [name] [name] is a 82 y.o. female who presents for Medicare Annual (Subsequent) preventive examination.  Review of Systems: N/A Cardiac Risk Factors include: (P) advanced age (>69men, >79 women)     Objective:     Vitals: BP 126/70 (BP Location: Left Arm, Patient Position: Sitting, Cuff Size: Normal)   Pulse 74   Temp 98.4 F (36.9 C) (Oral)   Ht 5\' 5"  (1.651 m) Comment: no shoes  Wt 208 lb 8 oz (94.6 kg)   SpO2 95%   BMI 34.70 kg/m   Body mass index is 34.7 kg/m.   Tobacco History  Smoking Status  . Former Smoker  . Quit date: 02/14/1957  Smokeless Tobacco  . Never Used     Counseling given: No   Past Medical History:  Diagnosis Date  . Alzheimer's dementia   . Breast cancer ([place])   . Diverticulosis   . HTN (hypertension)   . Hyperlipidemia   . Osteopenia    Past Surgical History:  Procedure Laterality Date  . BREAST LUMPECTOMY  2007   left breast  . right cataract extraction     Family History  Problem Relation Age of Onset  . Cancer Brother     Prostate  . Alzheimer's disease Mother   . Stroke Father   . Aneurysm Father   . Colon cancer Neg Hx    History  Sexual Activity  . Sexual activity: Not Currently  . Birth control/ protection: Post-menopausal    Outpatient Encounter Prescriptions as of 09/24/2015  Medication Sig  . atenolol (TENORMIN) 25 MG tablet TAKE 1 TABLET BY MOUTH DAILY  . Calcium Carbonate-Vitamin D (CALCIUM 600+D) 600-200 MG-UNIT TABS Take 1 tablet by mouth 2 (two) times daily.   . diclofenac (VOLTAREN) 75 MG EC tablet Take 1 tablet (75 mg total) by mouth 2 (two) times daily.  Marland Kitchen galantamine (RAZADYNE) 12 MG tablet TAKE 1 TABLET BY MOUTH TWICE A DAY  . lisinopril (PRINIVIL,ZESTRIL) 20 MG tablet Take 1 tablet (20 mg total) by mouth daily.  Marland Kitchen thiamine (VITAMIN B-1) 100 MG tablet Take 100 mg by mouth daily.  . [DISCONTINUED] simvastatin (ZOCOR) 40 MG tablet Take 40 mg by mouth daily.    No facility-administered  encounter medications on file as of 09/24/2015.     Activities of Daily Living In your present state of health, do you have any difficulty performing the following activities: 09/24/2015  Hearing? N  Vision? N  Difficulty concentrating or making decisions? Y  Walking or climbing stairs? Y  Dressing or bathing? Y  Doing errands, shopping? Y  Preparing Food and eating ? Y  Using the Toilet? Y  In the past six months, have you accidently leaked urine? N  Do you have problems with loss of bowel control? N  Managing your Medications? Y  Managing your Finances? Y  Housekeeping or managing your Housekeeping? Y  Some recent data might be hidden    Patient Care Team: Jinny Sanders, MD as PCP - General    Assessment:     Hearing Screening   125Hz  250Hz  500Hz  1000Hz  2000Hz  3000Hz  4000Hz  6000Hz  8000Hz   Right ear:   0 40 40  40    Left ear:   0 40 40  40      Visual Acuity Screening   Right eye Left eye Both eyes  Without correction: 20/40 20/40 20/40   With correction:       Exercise Activities and Dietary recommendations Current Exercise  Habits: (P) The patient does not participate in regular exercise at present, Exercise limited by: (P) orthopedic condition(s)  Goals    . Increase water intake          Starting 09/24/2015, I will continue to drink at least 6-8 glasses of water daily.      Fall Risk Fall Risk  09/24/2015 09/24/2015 10/03/2014 01/02/2014 02/13/2013  Falls in the past year? Yes Yes No Yes Yes  Number falls in past yr: 1 1 - - 1  Injury with Fall? No No - - No  Risk for fall due to : History of fall(s);Impaired balance/gait;Impaired mobility;Mental status change History of fall(s);Impaired balance/gait;Impaired mobility;Mental status change - - -  Follow up Falls evaluation completed;Education provided;Falls prevention discussed Falls evaluation completed;Education provided;Falls prevention discussed - - -   Depression Screen PHQ 2/9 Scores 09/24/2015 09/24/2015  10/03/2014 02/13/2013  PHQ - 2 Score 1 1 0 0     Cognitive Testing MMSE - Mini Mental State Exam 09/24/2015  Not completed: Unable to complete    Immunization History  Administered Date(s) Administered  . PPD Test 03/13/2015  . Pneumococcal Conjugate-13 10/03/2014  . Pneumococcal Polysaccharide-23 01/24/2008  . Td 11/27/1997, 01/24/2008   Screening Tests Health Maintenance  Topic Date Due  . FOOT EXAM  12/25/2015 (Originally 11/11/1943)  . INFLUENZA VACCINE  09/23/2028 (Originally 09/15/2015)  . OPHTHALMOLOGY EXAM  09/23/2028 (Originally 11/11/1943)  . ZOSTAVAX  09/23/2028 (Originally 11/12/1995)  . HEMOGLOBIN A1C  03/23/2016  . TETANUS/TDAP  01/23/2018  . DEXA SCAN  Completed  . PNA vac Low Risk Adult  Completed      Plan:     I have personally reviewed and addressed the Medicare Annual Wellness questionnaire and have noted the following in the patient's chart:  A. Medical and social history B. Use of alcohol, tobacco or illicit drugs  C. Current medications and supplements D. Functional ability and status E.  Nutritional status F.  Physical activity G. Advance directives H. List of other physicians I.  Hospitalizations, surgeries, and ER visits in previous 12 months J.  [place] to include hearing, vision, cognitive, depression L. Referrals and appointments - none  In addition, I have reviewed and discussed with patient certain preventive protocols, quality metrics, and best practice recommendations. A written personalized care plan for preventive services as well as general preventive health recommendations were provided to patient.  See attached scanned questionnaire for additional information.   Signed,   Lindell Noe, MHA, BS, LPN Health Advisor

## 2015-09-24 NOTE — Assessment & Plan Note (Signed)
Re-eval in 2019. Continue wt bearing exercise as tolerated.

## 2015-09-24 NOTE — Assessment & Plan Note (Signed)
Stable control on no medication. 

## 2015-09-24 NOTE — Assessment & Plan Note (Signed)
Well controlled. Continue current medication. Encouraged exercise, weight  maintanance, healthy eating habits.  

## 2015-10-01 ENCOUNTER — Other Ambulatory Visit: Payer: Self-pay | Admitting: Family Medicine

## 2015-10-29 ENCOUNTER — Other Ambulatory Visit: Payer: Self-pay | Admitting: Family Medicine

## 2015-12-18 ENCOUNTER — Telehealth: Payer: Self-pay | Admitting: Family Medicine

## 2015-12-18 DIAGNOSIS — E1140 Type 2 diabetes mellitus with diabetic neuropathy, unspecified: Secondary | ICD-10-CM

## 2015-12-18 DIAGNOSIS — E7800 Pure hypercholesterolemia, unspecified: Secondary | ICD-10-CM

## 2015-12-18 DIAGNOSIS — E559 Vitamin D deficiency, unspecified: Secondary | ICD-10-CM

## 2015-12-18 NOTE — Telephone Encounter (Signed)
-----   Message from Marchia Bond sent at 12/14/2015 10:54 AM EDT ----- Regarding: Dm f/u labs Fri 11/3, need orders. Thanks! :-) Please order future dm f/u labs for pt's upcoming lab appt. Thanks Aniceto Boss

## 2015-12-21 ENCOUNTER — Other Ambulatory Visit (INDEPENDENT_AMBULATORY_CARE_PROVIDER_SITE_OTHER): Payer: Medicare Other

## 2015-12-21 DIAGNOSIS — E1140 Type 2 diabetes mellitus with diabetic neuropathy, unspecified: Secondary | ICD-10-CM

## 2015-12-21 LAB — COMPREHENSIVE METABOLIC PANEL
ALK PHOS: 115 U/L (ref 39–117)
ALT: 17 U/L (ref 0–35)
AST: 13 U/L (ref 0–37)
Albumin: 3.9 g/dL (ref 3.5–5.2)
BUN: 15 mg/dL (ref 6–23)
CO2: 32 meq/L (ref 19–32)
Calcium: 9.7 mg/dL (ref 8.4–10.5)
Chloride: 101 mEq/L (ref 96–112)
Creatinine, Ser: 0.82 mg/dL (ref 0.40–1.20)
GFR: 71.27 mL/min (ref >60.00)
GLUCOSE: 129 mg/dL — AB (ref 70–99)
POTASSIUM: 4.2 meq/L (ref 3.5–5.1)
SODIUM: 139 meq/L (ref 135–145)
TOTAL PROTEIN: 7.1 g/dL (ref 6.0–8.3)
Total Bilirubin: 0.5 mg/dL (ref 0.2–1.2)

## 2015-12-21 LAB — HEMOGLOBIN A1C: Hgb A1c MFr Bld: 7.1 % — ABNORMAL HIGH (ref 4.6–6.5)

## 2015-12-25 ENCOUNTER — Encounter: Payer: Self-pay | Admitting: Family Medicine

## 2015-12-25 ENCOUNTER — Ambulatory Visit (INDEPENDENT_AMBULATORY_CARE_PROVIDER_SITE_OTHER): Payer: Medicare Other | Admitting: Family Medicine

## 2015-12-25 DIAGNOSIS — I10 Essential (primary) hypertension: Secondary | ICD-10-CM | POA: Diagnosis not present

## 2015-12-25 DIAGNOSIS — F5101 Primary insomnia: Secondary | ICD-10-CM | POA: Diagnosis not present

## 2015-12-25 DIAGNOSIS — G4700 Insomnia, unspecified: Secondary | ICD-10-CM | POA: Insufficient documentation

## 2015-12-25 DIAGNOSIS — E1140 Type 2 diabetes mellitus with diabetic neuropathy, unspecified: Secondary | ICD-10-CM

## 2015-12-25 LAB — HM DIABETES FOOT EXAM

## 2015-12-25 NOTE — Assessment & Plan Note (Signed)
Slight worsening. Tolerate higher A1C in this demented elderly patient to avoid lows.  Work on decreasing sugar in diet. Counseled husband and home helper.

## 2015-12-25 NOTE — Assessment & Plan Note (Addendum)
Previously well control on same regimen.  on recheck in office:  Low.. May be difficult to assess for CMA given tremor. Follow BP at home, call if > 140/90 consistently.

## 2015-12-25 NOTE — Assessment & Plan Note (Signed)
Occ.. Given age and dementia. Counsel against hypnotics.  Will try melatonin.. If not improving or worsening can consider low dose trazodone prn.

## 2015-12-25 NOTE — Patient Instructions (Addendum)
Change breakfast to PB and toast instead of jelly. Mg at bedtime as needed for sleep.  Try to decrease amount of ice cream, or change to sugar free or have 2-3 times a week instead of nightly.  Melatonin 3 mg. No napping during the day. Follow BP at home, call if > 140/90 consistently.  Needs yearly eye exam if not done.

## 2015-12-25 NOTE — Progress Notes (Signed)
Hypertension: previously good control on lisinopril on atenolol, lasix. BP Readings from Last 3 Encounters:  12/25/15 (!) 169/73  09/24/15 126/70  09/24/15 126/70  Using medication without problems or lightheadedness: None  Chest pain with exertion:None  Edema:None  Short of breath:None  Average home BPs: not checking  Other issues:    Diet compliance: healthy, eats a lot of sweets.  Exercise: walks some   Once a week trouble falling asleep. Not waking up a lot.    New diagnosis diabetes..  Worsening control.  FBS on labs 129, better than 239 3 months ago.  Lab Results  Component Value Date   HGBA1C 7.1 (H) 12/21/2015   Peripheral neuropathy: stable, no burning.  Social History /Family History/Past Medical History reviewed and updated if needed.  Review of Systems  Constitutional: Negative for fever, fatigue and unexpected weight change.  HENT: Negative for ear pain, congestion, sore throat, sneezing, trouble swallowing and sinus pressure.  Eyes: Negative for pain and itching.  Respiratory: Negative for cough, shortness of breath and wheezing.  Cardiovascular: Negative for chest pain, palpitations and leg swelling.  Gastrointestinal: Negative for nausea, abdominal pain, diarrhea, constipation and blood in stool.  Genitourinary: Negative for dysuria, hematuria, vaginal discharge and difficulty urinating.  Skin: Negative for rash.  Neurological: No numbness. Negative for syncope, weakness, light-headedness and headaches.  Psychiatric/Behavioral: Positive for confusion. Negative for dysphoric mood. The patient is not nervous/anxious.  Objective:  Physical Exam Constitutional: Vital signs are normal. She appears well-developed and well-nourished. She is cooperative. Non-toxic appearance. She does not appear ill. No distress.  HENT:  Head: Normocephalic.  Right Ear: Hearing, tympanic membrane, external ear and ear canal normal.  Left Ear: Hearing,  tympanic membrane, external ear and ear canal normal.  Nose: Nose normal.  Eyes: Conjunctivae normal, EOM and lids are normal. Pupils are equal, round, and reactive to light. No foreign bodies found.  Neck: Trachea normal and normal range of motion. Neck supple. Carotid bruit is not present. No mass and no thyromegaly present.  Cardiovascular: Normal rate, regular rhythm, S1 normal, S2 normal, normal heart sounds and intact distal pulses. Exam reveals no gallop and no friction rub.  No murmur heard.  Pulmonary/Chest: Effort normal and breath sounds normal. No respiratory distress. She has no wheezes. She has no rhonchi. She has no rales. She exhibits no tenderness.  Abdominal: Soft. Normal appearance and bowel sounds are normal. She exhibits no distension, no fluid wave, no abdominal bruit and no mass. There is no hepatosplenomegaly. There is no tenderness. There is no rebound, no guarding and no CVA tenderness. No hernia.  Lymphadenopathy:  She has no cervical adenopathy.  She has no axillary adenopathy.  Neurological: She is alert. She has normal strength. No cranial nerve deficit or sensory deficit. Coordination normal.  Skin: Skin is warm, dry and intact. No rash noted.  Psychiatric: She has a normal mood and affect. Her speech is normal and behavior is normal. Judgment normal. Her mood appears not anxious. She does not exhibit a depressed mood. She exhibits abnormal recent memory.   Diabetic foot exam: Normal inspection No skin breakdown No calluses  Normal DP pulses Normal sensation to light touch and monofilament Nails normal

## 2015-12-25 NOTE — Progress Notes (Signed)
Pre visit review using our clinic review tool, if applicable. No additional management support is needed unless otherwise documented below in the visit note. 

## 2016-01-19 ENCOUNTER — Other Ambulatory Visit: Payer: Self-pay | Admitting: Family Medicine

## 2016-01-19 NOTE — Telephone Encounter (Signed)
Last office visit 12/25/2015.  Last refilled 04/23/2015 for #30 with no refills.  Ok to refill?

## 2016-03-21 ENCOUNTER — Other Ambulatory Visit: Payer: Self-pay | Admitting: Family Medicine

## 2016-03-21 NOTE — Telephone Encounter (Signed)
Last office visit 12/25/2015.  Last refilled 01/19/2016 for #30 with 1 refills.  Ok to refill?

## 2016-03-28 ENCOUNTER — Encounter: Payer: Self-pay | Admitting: Family Medicine

## 2016-03-28 ENCOUNTER — Ambulatory Visit (INDEPENDENT_AMBULATORY_CARE_PROVIDER_SITE_OTHER): Payer: Medicare Other | Admitting: Family Medicine

## 2016-03-28 VITALS — BP 120/70 | HR 70 | Temp 98.7°F | Ht 65.0 in | Wt 208.2 lb

## 2016-03-28 DIAGNOSIS — I10 Essential (primary) hypertension: Secondary | ICD-10-CM

## 2016-03-28 DIAGNOSIS — G301 Alzheimer's disease with late onset: Secondary | ICD-10-CM | POA: Diagnosis not present

## 2016-03-28 DIAGNOSIS — L820 Inflamed seborrheic keratosis: Secondary | ICD-10-CM | POA: Diagnosis not present

## 2016-03-28 DIAGNOSIS — E1140 Type 2 diabetes mellitus with diabetic neuropathy, unspecified: Secondary | ICD-10-CM | POA: Diagnosis not present

## 2016-03-28 DIAGNOSIS — F0280 Dementia in other diseases classified elsewhere without behavioral disturbance: Secondary | ICD-10-CM

## 2016-03-28 LAB — HM DIABETES FOOT EXAM

## 2016-03-28 LAB — HEMOGLOBIN A1C: Hgb A1c MFr Bld: 6.9 % — ABNORMAL HIGH (ref 4.6–6.5)

## 2016-03-28 MED ORDER — DICLOFENAC SODIUM 75 MG PO TBEC: 75.0000 mg | DELAYED_RELEASE_TABLET | Freq: Two times a day (BID) | ORAL | 0 refills | Status: DC

## 2016-03-28 MED ORDER — GALANTAMINE HYDROBROMIDE 12 MG PO TABS: 12.0000 mg | ORAL_TABLET | Freq: Two times a day (BID) | ORAL | 3 refills | Status: DC

## 2016-03-28 NOTE — Patient Instructions (Signed)
Consider yearly eye exam.  Keep working on healthy eating , low carb diet. Stop at lab on way out.

## 2016-03-28 NOTE — Assessment & Plan Note (Addendum)
Due fpr re-eval with lifestyle changes of DM control.  Encouraged family to follow feet  closely given dementia and  neuropathy.

## 2016-03-28 NOTE — Progress Notes (Signed)
Pre visit review using our clinic review tool, if applicable. No additional management support is needed unless otherwise documented below in the visit note. 

## 2016-03-28 NOTE — Assessment & Plan Note (Signed)
Several areas irritate with pt rubbing and picking at them. No current infection but risk for infection. Will use cryotherapy to remove.

## 2016-03-28 NOTE — Progress Notes (Signed)
Subjective:    Patient ID: [name] [name], female    DOB: [birth year], 83 y.o.   MRN: [id number]  HPI   83 year old female presents for follow up 3 months.   Hypertension:  Good control on lisinopril, atenolol and lasix    BP Readings from Last 3 Encounters:  03/28/16 120/70  12/25/15 103/66  09/24/15 126/70  Using medication without problems or lightheadedness:  none Chest pain with exertion: none Edema:none Short of breath: none Average home BPs: good Other issues:  Diabetes:   Due for re-eval with labs today. Lab Results  Component Value Date   HGBA1C 7.1 (H) 12/21/2015  Feet problems: no ulcers on feet Blood Sugars averaging: not checking eye exam within last year: She has decrease jelly, cut back on sweets.  Has numbness in feet, not bothering her.    There are several skin area on right side of her face that she is picking at.Marland Kitchen Keeping her awake. No change in appearance.. They have been present a long time.  No bleeding. Review of Systems  Constitutional: Negative for fatigue and fever.  HENT: Negative for ear pain.   Eyes: Negative for pain.  Respiratory: Negative for chest tightness and shortness of breath.   Cardiovascular: Negative for chest pain, palpitations and leg swelling.  Gastrointestinal: Negative for abdominal pain.  Genitourinary: Negative for dysuria.       Objective:   Physical Exam  Constitutional: Vital signs are normal. She appears well-developed and well-nourished. She is cooperative.  Non-toxic appearance. She does not appear ill. No distress.  HENT:  Head: Normocephalic.  Right Ear: Hearing, tympanic membrane, external ear and ear canal normal. Tympanic membrane is not erythematous, not retracted and not bulging.  Left Ear: Hearing, tympanic membrane, external ear and ear canal normal. Tympanic membrane is not erythematous, not retracted and not bulging.  Nose: No mucosal edema or rhinorrhea. Right sinus exhibits no maxillary sinus  tenderness and no frontal sinus tenderness. Left sinus exhibits no maxillary sinus tenderness and no frontal sinus tenderness.  Mouth/Throat: Uvula is midline, oropharynx is clear and moist and mucous membranes are normal.  Eyes: Conjunctivae, EOM and lids are normal. Pupils are equal, round, and reactive to light. Lids are everted and swept, no foreign bodies found.  Neck: Trachea normal and normal range of motion. Neck supple. Carotid bruit is not present. No thyroid mass and no thyromegaly present.  Cardiovascular: Normal rate, regular rhythm, S1 normal, S2 normal, normal heart sounds, intact distal pulses and normal pulses.  Exam reveals no gallop and no friction rub.   No murmur heard. Pulmonary/Chest: Effort normal and breath sounds normal. No tachypnea. No respiratory distress. She has no decreased breath sounds. She has no wheezes. She has no rhonchi. She has no rales.  Abdominal: Soft. Normal appearance and bowel sounds are normal. There is no tenderness.  Musculoskeletal:       Right knee: She exhibits no swelling and no effusion. No tenderness found.       Left knee: She exhibits no effusion, no bony tenderness, normal meniscus and no MCL laxity. No tenderness found. No medial joint line, no lateral joint line, no MCL, no LCL and no patellar tendon tenderness noted.  Neurological: She is alert.  Skin: Skin is warm, dry and intact. No rash noted.  Many SK on body.. Irritated on right face  X 2    Psychiatric: Her speech is normal and behavior is normal. Judgment and thought content normal. Her mood  appears not anxious. Cognition and memory are normal. She does not exhibit a depressed mood.   Diabetic foot exam: Normal inspection No skin breakdown No calluses  Normal DP pulses decreased sensation to light touch and monofilament Nails normal        Assessment & Plan:    Procedure Note: 2 SKs on raight face treated with cryotherapy x 3 freeze thaw cycles with 1 mm halpo. No  complications.  Covered with neosporin and bandaid.

## 2016-03-28 NOTE — Addendum Note (Signed)
Addended by: Carter Kitten on: 03/28/2016 10:06 AM   Modules accepted: Orders

## 2016-03-28 NOTE — Assessment & Plan Note (Signed)
Per family stable  On current regimen.

## 2016-04-06 ENCOUNTER — Telehealth: Payer: Self-pay

## 2016-04-06 NOTE — Telephone Encounter (Signed)
Amy at [facility] left v/m that pt is having problems swallowing tablets. Atorvastatin 80 mg is very large tab and Amy changed to Atorvastatin 20 mg taking 4 tabs. Mr Moncion was waiting and Amy said if any problem with that to call [facility]. II have not changed med list until approved by Dr Diona Browner.Please advise.

## 2016-04-06 NOTE — Telephone Encounter (Signed)
Medication list updated.

## 2016-04-06 NOTE — Telephone Encounter (Signed)
No problem with tha.

## 2016-05-17 ENCOUNTER — Ambulatory Visit (INDEPENDENT_AMBULATORY_CARE_PROVIDER_SITE_OTHER): Payer: Medicare Other | Admitting: Family Medicine

## 2016-05-17 ENCOUNTER — Encounter: Payer: Self-pay | Admitting: Family Medicine

## 2016-05-17 VITALS — BP 118/74 | HR 63 | Temp 97.4°F | Ht 65.0 in | Wt 215.2 lb

## 2016-05-17 DIAGNOSIS — G301 Alzheimer's disease with late onset: Secondary | ICD-10-CM

## 2016-05-17 DIAGNOSIS — F0280 Dementia in other diseases classified elsewhere without behavioral disturbance: Secondary | ICD-10-CM | POA: Diagnosis not present

## 2016-05-17 NOTE — Progress Notes (Signed)
Pre visit review using our clinic review tool, if applicable. No additional management support is needed unless otherwise documented below in the visit note. 

## 2016-05-17 NOTE — Progress Notes (Signed)
   Subjective:    Patient ID: [name] [name], female    DOB: 07/19/1933, 83 y.o.   MRN: [id number]  HPI    83 year old female presents for follow up dementia.   She feels well overall. Family has minimal concerns.   She is currently on galantamine BID 12 mg.  No N/V.   Husband feels her memory has gotten worse. She is getting lost in house some. She is not wandering.  Using walker. Occ hears doorbell. No halluicnations, no paranoia. She feels happy, no depression. Wt Readings from Last 3 Encounters:  05/17/16 215 lb 4 oz (97.6 kg)  03/28/16 208 lb 4 oz (94.5 kg)  12/25/15 207 lb 8 oz (94.1 kg)    Has help at home during the day. Husband is doing well with the situation currently.    Review of Systems  Constitutional: Negative for fatigue and fever.  HENT: Negative for ear pain.   Eyes: Negative for pain.  Respiratory: Negative for chest tightness and shortness of breath.   Cardiovascular: Negative for chest pain, palpitations and leg swelling.  Gastrointestinal: Negative for abdominal pain.  Genitourinary: Negative for dysuria.       Objective:   Physical Exam  Constitutional: Vital signs are normal. She appears well-developed and well-nourished. She is cooperative.  Non-toxic appearance. She does not appear ill. No distress.  HENT:  Head: Normocephalic.  Right Ear: Hearing, tympanic membrane, external ear and ear canal normal. Tympanic membrane is not erythematous, not retracted and not bulging.  Left Ear: Hearing, tympanic membrane, external ear and ear canal normal. Tympanic membrane is not erythematous, not retracted and not bulging.  Nose: No mucosal edema or rhinorrhea. Right sinus exhibits no maxillary sinus tenderness and no frontal sinus tenderness. Left sinus exhibits no maxillary sinus tenderness and no frontal sinus tenderness.  Mouth/Throat: Uvula is midline, oropharynx is clear and moist and mucous membranes are normal.  Eyes: Conjunctivae, EOM and lids  are normal. Pupils are equal, round, and reactive to light. Lids are everted and swept, no foreign bodies found.  Neck: Trachea normal and normal range of motion. Neck supple. Carotid bruit is not present. No thyroid mass and no thyromegaly present.  Cardiovascular: Normal rate, regular rhythm, S1 normal, S2 normal, normal heart sounds, intact distal pulses and normal pulses.  Exam reveals no gallop and no friction rub.   No murmur heard. Pulmonary/Chest: Effort normal and breath sounds normal. No tachypnea. No respiratory distress. She has no decreased breath sounds. She has no wheezes. She has no rhonchi. She has no rales.  Abdominal: Soft. Normal appearance and bowel sounds are normal. There is no tenderness.  Musculoskeletal:       Right knee: She exhibits no swelling and no effusion. No tenderness found.       Left knee: She exhibits no effusion, no bony tenderness, normal meniscus and no MCL laxity. No tenderness found. No medial joint line, no lateral joint line, no MCL, no LCL and no patellar tendon tenderness noted.  Neurological: She is alert.  Oriented x 2  Skin: Skin is warm, dry and intact. No rash noted.  Many SK on body.    Psychiatric: Her speech is normal and behavior is normal. Judgment and thought content normal. Her mood appears not anxious. Cognition and memory are normal. She does not exhibit a depressed mood.          Assessment & Plan:

## 2016-05-17 NOTE — Assessment & Plan Note (Addendum)
MMSE stable today but different areas decline.. Unable to follow some directions, unable to write a sentence.  Husband manageing well at home with daytime care giver assistance.Pt requires 24 hour observation.  No current wandering at night, but would if someone not with her.  No behavoiral issues.  On max galantamine. Aricept, namenda and exelon patches made her nauseated, diarrhea  Husband refuses hospice or nursing home as he is managing well at home.

## 2016-05-17 NOTE — Patient Instructions (Signed)
Continue current medications. Call if difficulties arise in caring for [name] [name] at home.

## 2016-07-12 ENCOUNTER — Other Ambulatory Visit: Payer: Self-pay | Admitting: Family Medicine

## 2016-07-27 ENCOUNTER — Other Ambulatory Visit: Payer: Self-pay | Admitting: Family Medicine

## 2016-08-01 ENCOUNTER — Telehealth: Payer: Self-pay

## 2016-08-01 ENCOUNTER — Other Ambulatory Visit: Payer: Self-pay | Admitting: Family Medicine

## 2016-08-01 NOTE — Telephone Encounter (Signed)
Last office visit 06/13/2016.  Last refilled 03/28/2016 for #90 with no refills.  Ok to refill?

## 2016-08-01 NOTE — Telephone Encounter (Signed)
I am not aware of substitute.. Will have to stop completely.

## 2016-08-01 NOTE — Telephone Encounter (Signed)
Bea at [facility] left v/m; cannot get galantamine from wholesaler; med is being d/c.  Bea request substitute med for pt.Please advise.

## 2016-08-02 NOTE — Telephone Encounter (Signed)
Spoke to Amy at [facility]. She said they can get the 4mg  and the 8mg  ER. The new wholesaler is in the store today trying to clear it up. Wanted to let you kow the only one not available is the 12mg . There is a 24mg  ER and a 16mg  ER available. The patient's husband is aware of the situation.

## 2016-08-03 NOTE — Telephone Encounter (Signed)
Spoke to [employer] at [place]. The new wholesaler said they could have the 12mg  to them next week sometime. They picked up the last rx 06-30-16 so she is about to be out. Would you want to do a rx for the 4mg  3 tabs bid or just wait for next week and she may go without for a few days?

## 2016-08-03 NOTE — Telephone Encounter (Signed)
Does she need a different prescription to do the 4 and 8 together?

## 2016-08-07 NOTE — Telephone Encounter (Signed)
She can go without for a few days.

## 2016-08-08 NOTE — Telephone Encounter (Signed)
Bea notified as instructed by telephone and verbalized understanding.

## 2016-09-16 ENCOUNTER — Ambulatory Visit: Payer: Medicare Other | Admitting: Family Medicine

## 2016-09-22 ENCOUNTER — Other Ambulatory Visit: Payer: Self-pay | Admitting: Family Medicine

## 2016-09-22 NOTE — Telephone Encounter (Signed)
Last office visit 05/17/2016.  Last refilled 08/01/2016 for #90 with no refills.  Ok to refill?

## 2016-10-14 ENCOUNTER — Ambulatory Visit (INDEPENDENT_AMBULATORY_CARE_PROVIDER_SITE_OTHER): Payer: Medicare Other | Admitting: Family Medicine

## 2016-10-14 ENCOUNTER — Encounter: Payer: Self-pay | Admitting: Family Medicine

## 2016-10-14 ENCOUNTER — Encounter: Payer: Self-pay | Admitting: *Deleted

## 2016-10-14 ENCOUNTER — Ambulatory Visit (INDEPENDENT_AMBULATORY_CARE_PROVIDER_SITE_OTHER): Payer: Medicare Other

## 2016-10-14 VITALS — BP 120/80 | HR 67 | Temp 97.8°F | Ht 64.5 in | Wt 214.8 lb

## 2016-10-14 DIAGNOSIS — E7800 Pure hypercholesterolemia, unspecified: Secondary | ICD-10-CM

## 2016-10-14 DIAGNOSIS — E1140 Type 2 diabetes mellitus with diabetic neuropathy, unspecified: Secondary | ICD-10-CM

## 2016-10-14 DIAGNOSIS — E559 Vitamin D deficiency, unspecified: Secondary | ICD-10-CM

## 2016-10-14 DIAGNOSIS — F0280 Dementia in other diseases classified elsewhere without behavioral disturbance: Secondary | ICD-10-CM

## 2016-10-14 DIAGNOSIS — I10 Essential (primary) hypertension: Secondary | ICD-10-CM

## 2016-10-14 DIAGNOSIS — Z0000 Encounter for general adult medical examination without abnormal findings: Secondary | ICD-10-CM | POA: Diagnosis not present

## 2016-10-14 DIAGNOSIS — G301 Alzheimer's disease with late onset: Secondary | ICD-10-CM | POA: Diagnosis not present

## 2016-10-14 LAB — CBC WITH DIFFERENTIAL/PLATELET
BASOS PCT: 0.8 % (ref 0.0–3.0)
Basophils Absolute: 0.1 10*3/uL (ref 0.0–0.1)
Eosinophils Absolute: 0.4 10*3/uL (ref 0.0–0.7)
Eosinophils Relative: 3.4 % (ref 0.0–5.0)
HEMATOCRIT: 41.6 % (ref 36.0–46.0)
Hemoglobin: 13.6 g/dL (ref 12.0–15.0)
Lymphocytes Relative: 17.7 % (ref 12.0–46.0)
Lymphs Abs: 1.9 10*3/uL (ref 0.7–4.0)
MCHC: 32.6 g/dL (ref 30.0–36.0)
MCV: 95.5 fl (ref 78.0–100.0)
MONOS PCT: 8.4 % (ref 3.0–12.0)
Monocytes Absolute: 0.9 10*3/uL (ref 0.1–1.0)
NEUTROS ABS: 7.3 10*3/uL (ref 1.4–7.7)
NEUTROS PCT: 69.7 % (ref 43.0–77.0)
Platelets: 340 10*3/uL (ref 150.0–400.0)
RBC: 4.36 Mil/uL (ref 3.87–5.11)
RDW: 14 % (ref 11.5–15.5)
WBC: 10.5 10*3/uL (ref 4.0–10.5)

## 2016-10-14 LAB — LIPID PANEL
CHOLESTEROL: 128 mg/dL (ref 0–200)
HDL: 33.1 mg/dL — ABNORMAL LOW (ref >39.00)
NonHDL: 94.75
TRIGLYCERIDES: 257 mg/dL — AB (ref 0.0–149.0)
Total CHOL/HDL Ratio: 4
VLDL: 51.4 mg/dL — ABNORMAL HIGH (ref 0.0–40.0)

## 2016-10-14 LAB — COMPREHENSIVE METABOLIC PANEL
ALBUMIN: 3.8 g/dL (ref 3.5–5.2)
ALT: 19 U/L (ref 0–35)
AST: 12 U/L (ref 0–37)
Alkaline Phosphatase: 91 U/L (ref 39–117)
BUN: 17 mg/dL (ref 6–23)
CO2: 30 mEq/L (ref 19–32)
Calcium: 9.7 mg/dL (ref 8.4–10.5)
Chloride: 100 mEq/L (ref 96–112)
Creatinine, Ser: 0.9 mg/dL (ref 0.40–1.20)
GFR: 63.88 mL/min (ref >60.00)
GLUCOSE: 135 mg/dL — AB (ref 70–99)
Potassium: 4.7 mEq/L (ref 3.5–5.1)
SODIUM: 137 meq/L (ref 135–145)
TOTAL PROTEIN: 7 g/dL (ref 6.0–8.3)
Total Bilirubin: 0.3 mg/dL (ref 0.2–1.2)

## 2016-10-14 LAB — VITAMIN D 25 HYDROXY (VIT D DEFICIENCY, FRACTURES): VITD: 52 ng/mL (ref 30.00–100.00)

## 2016-10-14 LAB — HEMOGLOBIN A1C: HEMOGLOBIN A1C: 7.2 % — AB (ref 4.6–6.5)

## 2016-10-14 LAB — LDL CHOLESTEROL, DIRECT: Direct LDL: 64 mg/dL

## 2016-10-14 LAB — HM DIABETES FOOT EXAM

## 2016-10-14 NOTE — Assessment & Plan Note (Signed)
Due for re-eval. 

## 2016-10-14 NOTE — Progress Notes (Signed)
Subjective:    Patient ID: [name] [name], female    DOB: [birth year], 83 y.o.   MRN: [id number]  HPI   83 year old female presents for part 2 of medicare wellness, complete physical exam.   The patient saw Candis Musa, LPN for medicare wellness. Note reviewed in detail and important notes copied below. Health maintenance: A1C - completed Abnormal screenings:  Hearing - failed Patient concerns:  None  Hypertension:    Good control on lisinopril, atenolol.  BP Readings from Last 3 Encounters:  10/14/16 120/80  10/14/16 120/80  05/17/16 118/74  Using medication without problems or lightheadedness:  none Chest pain with exertion: none Edema:none Short of breath: Average home BPs: good Other issues:  Elevated Cholesterol: Pt on max statin.. Wishes to continue labs pendinggiven dementia. Using medications without problems: none Muscle aches: none Diet compliance: moderate Exercise: none Other complaints:  Diabetes:   Good control on no med, diet controlled Lab Results  Component Value Date   HGBA1C 6.9 (H) 03/28/2016  Body mass index is 36.29 kg/m. Wt Readings from Last 3 Encounters:  10/14/16 214 lb 12 oz (97.4 kg)  10/14/16 214 lb 12 oz (97.4 kg)  05/17/16 215 lb 4 oz (97.6 kg)  Feet problems: none Blood Sugars averaging: not chekcing eye exam within last year: due   Dementia: stable on galantamine.  No behavoiral issues.  Occ sleep issues.  Good appetite. No hallucinations   Social History /Family History/Past Medical History reviewed in detail and updated in EMR if needed. Blood pressure 120/80, pulse 67, temperature 97.8 F (36.6 C), temperature source Oral, height 5' 4.5" (1.638 m), weight 214 lb 12 oz (97.4 kg), SpO2 96 %.   Review of Systems  Constitutional: Negative for fatigue and fever.  HENT: Negative for congestion and ear pain.   Eyes: Negative for pain.  Respiratory: Negative for cough, chest tightness and shortness of breath.     Cardiovascular: Negative for chest pain, palpitations and leg swelling.  Gastrointestinal: Negative for abdominal pain.  Genitourinary: Negative for dysuria and vaginal bleeding.  Musculoskeletal: Negative for back pain.  Neurological: Negative for syncope, light-headedness and headaches.  Psychiatric/Behavioral: Negative for dysphoric mood.       Objective:   Physical Exam  Constitutional: Vital signs are normal. She appears well-developed and well-nourished. She is cooperative.  Non-toxic appearance. She does not appear ill. No distress.  Elderly female in NAD  central obesity  HENT:  Head: Normocephalic.  Right Ear: Hearing, tympanic membrane, external ear and ear canal normal. Tympanic membrane is not erythematous, not retracted and not bulging.  Left Ear: Hearing, tympanic membrane, external ear and ear canal normal. Tympanic membrane is not erythematous, not retracted and not bulging.  Nose: No mucosal edema or rhinorrhea. Right sinus exhibits no maxillary sinus tenderness and no frontal sinus tenderness. Left sinus exhibits no maxillary sinus tenderness and no frontal sinus tenderness.  Mouth/Throat: Uvula is midline, oropharynx is clear and moist and mucous membranes are normal.  Eyes: Pupils are equal, round, and reactive to light. Conjunctivae, EOM and lids are normal. Lids are everted and swept, no foreign bodies found.  Neck: Trachea normal and normal range of motion. Neck supple. Carotid bruit is not present. No thyroid mass and no thyromegaly present.  Cardiovascular: Normal rate, regular rhythm, S1 normal, S2 normal, normal heart sounds, intact distal pulses and normal pulses.  Exam reveals no gallop and no friction rub.   No murmur heard. Mild bilateral edmea peripherally  Pulmonary/Chest: Effort normal and breath sounds normal. No tachypnea. No respiratory distress. She has no decreased breath sounds. She has no wheezes. She has no rhonchi. She has no rales.  Abdominal:  Soft. Normal appearance and bowel sounds are normal. There is no tenderness.  Musculoskeletal:       Right knee: She exhibits no swelling and no effusion. No tenderness found.       Left knee: She exhibits no effusion, no bony tenderness, normal meniscus and no MCL laxity. No tenderness found. No medial joint line, no lateral joint line, no MCL, no LCL and no patellar tendon tenderness noted.  Neurological: She is alert.  Oriented x 2  Skin: Skin is warm, dry and intact. No rash noted.  Many SK on body.    Psychiatric: Her speech is normal and behavior is normal. Thought content normal. Her mood appears not anxious. Cognition and memory are impaired. She expresses impulsivity and inappropriate judgment. She does not exhibit a depressed mood. She expresses no homicidal and no suicidal ideation. She expresses no suicidal plans and no homicidal plans. She exhibits abnormal recent memory and abnormal remote memory.          Assessment & Plan:  The patient's preventative maintenance and recommended screening tests for an annual wellness exam were reviewed in full today. Brought up to date unless services declined.  Counselled on the importance of diet, exercise, and its role in overall health and mortality. The patient's FH and SH was reviewed, including their home life, tobacco status, and drug and alcohol status.    Mammo, colon, pelvic, pap not indicated  nonsmoker

## 2016-10-14 NOTE — Progress Notes (Signed)
I reviewed health advisor's note, was available for consultation, and agree with documentation and plan.  

## 2016-10-14 NOTE — Progress Notes (Signed)
PCP notes:   Health maintenance:  A1C - completed  Abnormal screenings:   Hearing - failed  Patient concerns:   None  Nurse concerns:  None  Next PCP appt:   10/14/16 @ 1145

## 2016-10-14 NOTE — Patient Instructions (Signed)
Ms. Creswell , Thank you for taking time to come for your Medicare Wellness Visit. I appreciate your ongoing commitment to your health goals. Please review the following plan we discussed and let me know if I can assist you in the future.   These are the goals we discussed: Goals    . Increase water intake          Starting 10/14/2016, I will continue to drink at least 3-4 glasses of water daily.       This is a list of the screening recommended for you and due dates:  Health Maintenance  Topic Date Due  . Flu Shot  09/23/2028*  . Eye exam for diabetics  09/23/2028*  . Complete foot exam   03/28/2017  . Hemoglobin A1C  04/13/2017  . Tetanus Vaccine  01/23/2018  . DEXA scan (bone density measurement)  Completed  . Pneumonia vaccines  Completed  *Topic was postponed. The date shown is not the original due date.   Preventive Care for Adults  A healthy lifestyle and preventive care can promote health and wellness. Preventive health guidelines for adults include the following key practices.  . A routine yearly physical is a good way to check with your health care provider about your health and preventive screening. It is a chance to share any concerns and updates on your health and to receive a thorough exam.  . Visit your dentist for a routine exam and preventive care every 6 months. Brush your teeth twice a day and floss once a day. Good oral hygiene prevents tooth decay and gum disease.  . The frequency of eye exams is based on your age, health, family medical history, use  of contact lenses, and other factors. Follow your health care provider's ecommendations for frequency of eye exams.  . Eat a healthy diet. Foods like vegetables, fruits, whole grains, low-fat dairy products, and lean protein foods contain the nutrients you need without too many calories. Decrease your intake of foods high in solid fats, added sugars, and salt. Eat the right amount of calories for you. Get information  about a proper diet from your health care provider, if necessary.  . Regular physical exercise is one of the most important things you can do for your health. Most adults should get at least 150 minutes of moderate-intensity exercise (any activity that increases your heart rate and causes you to sweat) each week. In addition, most adults need muscle-strengthening exercises on 2 or more days a week.  Silver Sneakers may be a benefit available to you. To determine eligibility, you may visit the website: www.silversneakers.com or contact program at 6035865185 Mon-Fri between 8AM-8PM.   . Maintain a healthy weight. The body mass index (BMI) is a screening tool to identify possible weight problems. It provides an estimate of body fat based on height and weight. Your health care provider can find your BMI and can help you achieve or maintain a healthy weight.   For adults 20 years and older: ? A BMI below 18.5 is considered underweight. ? A BMI of 18.5 to 24.9 is normal. ? A BMI of 25 to 29.9 is considered overweight. ? A BMI of 30 and above is considered obese.   . Maintain normal blood lipids and cholesterol levels by exercising and minimizing your intake of saturated fat. Eat a balanced diet with plenty of fruit and vegetables. Blood tests for lipids and cholesterol should begin at age 46 and be repeated every 5 years. If  your lipid or cholesterol levels are high, you are over 50, or you are at high risk for heart disease, you may need your cholesterol levels checked more frequently. Ongoing high lipid and cholesterol levels should be treated with medicines if diet and exercise are not working.  . If you smoke, find out from your health care provider how to quit. If you do not use tobacco, please do not start.  . If you choose to drink alcohol, please do not consume more than 2 drinks per day. One drink is considered to be 12 ounces (355 mL) of beer, 5 ounces (148 mL) of wine, or 1.5 ounces (44  mL) of liquor.  . If you are 89-29 years old, ask your health care provider if you should take aspirin to prevent strokes.  . Use sunscreen. Apply sunscreen liberally and repeatedly throughout the day. You should seek shade when your shadow is shorter than you. Protect yourself by wearing long sleeves, pants, a wide-brimmed hat, and sunglasses year round, whenever you are outdoors.  . Once a month, do a whole body skin exam, using a mirror to look at the skin on your back. Tell your health care provider of new moles, moles that have irregular borders, moles that are larger than a pencil eraser, or moles that have changed in shape or color.

## 2016-10-14 NOTE — Assessment & Plan Note (Signed)
Encouraged exercise, weight loss, healthy eating habits. Well controlled. Continue current medication.  

## 2016-10-14 NOTE — Assessment & Plan Note (Signed)
Not bothersome to pt, on no med. Encouraged family to keep eye on 5th toe rubbing neighboring toe on left foot.

## 2016-10-14 NOTE — Progress Notes (Signed)
Pre visit review using our clinic review tool, if applicable. No additional management support is needed unless otherwise documented below in the visit note. 

## 2016-10-14 NOTE — Assessment & Plan Note (Signed)
Stable last check on galantamine. Discussed sundowning prevention.

## 2016-10-14 NOTE — Progress Notes (Signed)
Subjective:   [name] [name] is a 83 y.o. female who presents for Medicare Annual (Subsequent) preventive examination.  Review of Systems:  N/A Cardiac Risk Factors include: advanced age (>68men, >49 women);obesity (BMI >30kg/m2);diabetes mellitus;hypertension;dyslipidemia     Objective:     Vitals: BP 120/80 (BP Location: Right Arm, Patient Position: Sitting, Cuff Size: Normal)   Pulse 67   Temp 97.8 F (36.6 C) (Oral)   Ht 5' 4.5" (1.638 m) Comment: no shoes  Wt 214 lb 12 oz (97.4 kg)   SpO2 96%   BMI 36.29 kg/m   Body mass index is 36.29 kg/m.   Tobacco History  Smoking Status  . Former Smoker  . Quit date: 02/14/1957  Smokeless Tobacco  . Never Used     Counseling given: No   Past Medical History:  Diagnosis Date  . Alzheimer's dementia   . Breast cancer ([place])   . Diverticulosis   . HTN (hypertension)   . Hyperlipidemia   . Osteopenia    Past Surgical History:  Procedure Laterality Date  . BREAST LUMPECTOMY  2007   left breast  . right cataract extraction     Family History  Problem Relation Age of Onset  . Cancer Brother        Prostate  . Alzheimer's disease Mother   . Stroke Father   . Aneurysm Father   . Colon cancer Neg Hx    History  Sexual Activity  . Sexual activity: Not Currently  . Birth control/ protection: Post-menopausal    Outpatient Encounter Prescriptions as of 10/14/2016  Medication Sig  . atenolol (TENORMIN) 25 MG tablet TAKE 1 TABLET BY MOUTH DAILY  . atorvastatin (LIPITOR) 20 MG tablet Take 80 mg by mouth daily.  . Calcium Carbonate-Vitamin D (CALCIUM 600+D) 600-200 MG-UNIT TABS Take 1 tablet by mouth 2 (two) times daily.   . diclofenac (VOLTAREN) 75 MG EC tablet TAKE 1 TABLET BY MOUTH TWICE A DAY  . galantamine (RAZADYNE) 12 MG tablet Take 1 tablet (12 mg total) by mouth 2 (two) times daily.  Marland Kitchen lisinopril (PRINIVIL,ZESTRIL) 20 MG tablet TAKE 1 TABLET BY MOUTH DAILY  . thiamine (VITAMIN B-1) 100 MG tablet Take 100 mg by  mouth daily.   No facility-administered encounter medications on file as of 10/14/2016.     Activities of Daily Living In your present state of health, do you have any difficulty performing the following activities: 10/14/2016  Hearing? N  Vision? N  Difficulty concentrating or making decisions? Y  Walking or climbing stairs? Y  Dressing or bathing? N  Doing errands, shopping? Y  Preparing Food and eating ? Y  Using the Toilet? Y  In the past six months, have you accidently leaked urine? Y  Do you have problems with loss of bowel control? N  Managing your Medications? Y  Managing your Finances? Y  Housekeeping or managing your Housekeeping? Y  Some recent data might be hidden    Patient Care Team: Jinny Sanders, MD as PCP - General    Assessment:     Hearing Screening   125Hz  250Hz  500Hz  1000Hz  2000Hz  3000Hz  4000Hz  6000Hz  8000Hz   Right ear:   40 0 40  0    Left ear:   40 0 40  0      Visual Acuity Screening   Right eye Left eye Both eyes  Without correction:     With correction: 20/50 20/50 20/40     Exercise Activities and Dietary recommendations Current  Exercise Habits: The patient does not participate in regular exercise at present, Exercise limited by: None identified  Goals    . Increase water intake          Starting 10/14/2016, I will continue to drink at least 3-4 glasses of water daily.      Fall Risk Fall Risk  10/14/2016 09/24/2015 09/24/2015 10/03/2014 01/02/2014  Falls in the past year? No Yes Yes No Yes  Number falls in past yr: - 1 1 - -  Injury with Fall? - No No - -  Risk for fall due to : - History of fall(s);Impaired balance/gait;Impaired mobility;Mental status change History of fall(s);Impaired balance/gait;Impaired mobility;Mental status change - -  Follow up - Falls evaluation completed;Education provided;Falls prevention discussed Falls evaluation completed;Education provided;Falls prevention discussed - -   Depression Screen PHQ 2/9 Scores  10/14/2016 09/24/2015 09/24/2015 10/03/2014  PHQ - 2 Score 0 1 1 0  PHQ- 9 Score 0 - - -     Cognitive Function MMSE - Mini Mental State Exam 10/14/2016 09/24/2015  Not completed: (No Data) Unable to complete    DX of dementia. A MINI-COG was not completed.     Immunization History  Administered Date(s) Administered  . PPD Test 03/13/2015  . Pneumococcal Conjugate-13 10/03/2014  . Pneumococcal Polysaccharide-23 01/24/2008  . Td 11/27/1997, 01/24/2008   Screening Tests Health Maintenance  Topic Date Due  . INFLUENZA VACCINE  09/23/2028 (Originally 09/14/2016)  . OPHTHALMOLOGY EXAM  09/23/2028 (Originally 11/11/1943)  . FOOT EXAM  03/28/2017  . HEMOGLOBIN A1C  04/13/2017  . TETANUS/TDAP  01/23/2018  . DEXA SCAN  Completed  . PNA vac Low Risk Adult  Completed      Plan:     I have personally reviewed and addressed the Medicare Annual Wellness questionnaire and have noted the following in the patient's chart:  A. Medical and social history B. Use of alcohol, tobacco or illicit drugs  C. Current medications and supplements D. Functional ability and status E.  Nutritional status F.  Physical activity G. Advance directives H. List of other physicians I.  Hospitalizations, surgeries, and ER visits in previous 12 months J.  [place] to include hearing, vision, cognitive, depression L. Referrals and appointments - none  In addition, I have reviewed and discussed with patient certain preventive protocols, quality metrics, and best practice recommendations. A written personalized care plan for preventive services as well as general preventive health recommendations were provided to patient.  See attached scanned questionnaire for additional information.   Signed,   Lindell Noe, MHA, BS, LPN Health Coach

## 2016-10-26 ENCOUNTER — Other Ambulatory Visit: Payer: Self-pay | Admitting: Family Medicine

## 2016-12-26 ENCOUNTER — Other Ambulatory Visit: Payer: Self-pay | Admitting: Family Medicine

## 2017-03-11 ENCOUNTER — Encounter (HOSPITAL_COMMUNITY): Payer: Self-pay | Admitting: Physician Assistant

## 2017-03-11 ENCOUNTER — Emergency Department (HOSPITAL_COMMUNITY): Payer: Medicare Other

## 2017-03-11 ENCOUNTER — Inpatient Hospital Stay (HOSPITAL_COMMUNITY)
Admission: EM | Admit: 2017-03-11 | Discharge: 2017-03-15 | DRG: 871 | Disposition: A | Discharge status: Skilled Nursing Facility | Payer: Medicare Other | Attending: Internal Medicine | Admitting: Internal Medicine

## 2017-03-11 DIAGNOSIS — Z853 Personal history of malignant neoplasm of breast: Secondary | ICD-10-CM

## 2017-03-11 DIAGNOSIS — A419 Sepsis, unspecified organism: Principal | ICD-10-CM

## 2017-03-11 DIAGNOSIS — G9341 Metabolic encephalopathy: Secondary | ICD-10-CM | POA: Diagnosis not present

## 2017-03-11 DIAGNOSIS — G309 Alzheimer's disease, unspecified: Secondary | ICD-10-CM | POA: Diagnosis present

## 2017-03-11 DIAGNOSIS — E875 Hyperkalemia: Secondary | ICD-10-CM | POA: Diagnosis present

## 2017-03-11 DIAGNOSIS — E1140 Type 2 diabetes mellitus with diabetic neuropathy, unspecified: Secondary | ICD-10-CM | POA: Diagnosis present

## 2017-03-11 DIAGNOSIS — J811 Chronic pulmonary edema: Secondary | ICD-10-CM | POA: Diagnosis present

## 2017-03-11 DIAGNOSIS — I10 Essential (primary) hypertension: Secondary | ICD-10-CM | POA: Diagnosis present

## 2017-03-11 DIAGNOSIS — F0280 Dementia in other diseases classified elsewhere without behavioral disturbance: Secondary | ICD-10-CM | POA: Diagnosis present

## 2017-03-11 DIAGNOSIS — N390 Urinary tract infection, site not specified: Secondary | ICD-10-CM | POA: Diagnosis not present

## 2017-03-11 DIAGNOSIS — R918 Other nonspecific abnormal finding of lung field: Secondary | ICD-10-CM | POA: Diagnosis not present

## 2017-03-11 DIAGNOSIS — E7800 Pure hypercholesterolemia, unspecified: Secondary | ICD-10-CM

## 2017-03-11 DIAGNOSIS — M8580 Other specified disorders of bone density and structure, unspecified site: Secondary | ICD-10-CM | POA: Diagnosis present

## 2017-03-11 DIAGNOSIS — Z882 Allergy status to sulfonamides status: Secondary | ICD-10-CM

## 2017-03-11 DIAGNOSIS — Z820 Family history of epilepsy and other diseases of the nervous system: Secondary | ICD-10-CM

## 2017-03-11 DIAGNOSIS — R4182 Altered mental status, unspecified: Secondary | ICD-10-CM | POA: Diagnosis not present

## 2017-03-11 DIAGNOSIS — E785 Hyperlipidemia, unspecified: Secondary | ICD-10-CM | POA: Diagnosis present

## 2017-03-11 DIAGNOSIS — R000 Tachycardia, unspecified: Secondary | ICD-10-CM | POA: Diagnosis not present

## 2017-03-11 DIAGNOSIS — Z9841 Cataract extraction status, right eye: Secondary | ICD-10-CM

## 2017-03-11 DIAGNOSIS — Z791 Long term (current) use of non-steroidal anti-inflammatories (NSAID): Secondary | ICD-10-CM

## 2017-03-11 DIAGNOSIS — Z87891 Personal history of nicotine dependence: Secondary | ICD-10-CM

## 2017-03-11 DIAGNOSIS — Z79899 Other long term (current) drug therapy: Secondary | ICD-10-CM

## 2017-03-11 HISTORY — DX: Malignant neoplasm of unspecified site of left female breast: C50.912

## 2017-03-11 HISTORY — DX: Pneumonia, unspecified organism: J18.9

## 2017-03-11 LAB — CBC WITH DIFFERENTIAL/PLATELET
Basophils Absolute: 0 10*3/uL (ref 0.0–0.1)
Basophils Relative: 0 %
Eosinophils Absolute: 0 10*3/uL (ref 0.0–0.7)
Eosinophils Relative: 0 %
HCT: 43.5 % (ref 36.0–46.0)
Hemoglobin: 14.5 g/dL (ref 12.0–15.0)
Lymphocytes Relative: 12 %
Lymphs Abs: 1.6 10*3/uL (ref 0.7–4.0)
MCH: 31.7 pg (ref 26.0–34.0)
MCHC: 33.3 g/dL (ref 30.0–36.0)
MCV: 95.2 fL (ref 78.0–100.0)
Monocytes Absolute: 0.8 10*3/uL (ref 0.1–1.0)
Monocytes Relative: 6 %
Neutro Abs: 10.8 10*3/uL — ABNORMAL HIGH (ref 1.7–7.7)
Neutrophils Relative %: 82 %
Platelets: 240 10*3/uL (ref 150–400)
RBC: 4.57 MIL/uL (ref 3.87–5.11)
RDW: 14.4 % (ref 11.5–15.5)
WBC: 13.2 10*3/uL — ABNORMAL HIGH (ref 4.0–10.5)

## 2017-03-11 LAB — URINALYSIS, ROUTINE W REFLEX MICROSCOPIC
Bilirubin Urine: NEGATIVE
GLUCOSE, UA: NEGATIVE mg/dL
KETONES UR: NEGATIVE mg/dL
Nitrite: POSITIVE — AB
Protein, ur: 100 mg/dL — AB
SQUAMOUS EPITHELIAL / LPF: NONE SEEN
Specific Gravity, Urine: 1.02 (ref 1.005–1.030)
pH: 5 (ref 5.0–8.0)

## 2017-03-11 LAB — COMPREHENSIVE METABOLIC PANEL
ALBUMIN: 3.5 g/dL (ref 3.5–5.0)
ALT: 18 U/L (ref 14–54)
ANION GAP: 13 (ref 5–15)
AST: 38 U/L (ref 15–41)
Alkaline Phosphatase: 97 U/L (ref 38–126)
BILIRUBIN TOTAL: 2.1 mg/dL — AB (ref 0.3–1.2)
BUN: 18 mg/dL (ref 6–20)
CHLORIDE: 100 mmol/L — AB (ref 101–111)
CO2: 21 mmol/L — AB (ref 22–32)
Calcium: 9 mg/dL (ref 8.9–10.3)
Creatinine, Ser: 0.88 mg/dL (ref 0.44–1.00)
GFR calc Af Amer: >60 mL/min (ref >60)
GFR calc non Af Amer: 60 mL/min — ABNORMAL LOW (ref >60)
GLUCOSE: 167 mg/dL — AB (ref 65–99)
POTASSIUM: 5.2 mmol/L — AB (ref 3.5–5.1)
SODIUM: 134 mmol/L — AB (ref 135–145)
TOTAL PROTEIN: 7.2 g/dL (ref 6.5–8.1)

## 2017-03-11 LAB — PROTIME-INR
INR: 0.97
Prothrombin Time: 12.8 seconds (ref 11.4–15.2)

## 2017-03-11 LAB — I-STAT CG4 LACTIC ACID, ED: Lactic Acid, Venous: 1.58 mmol/L (ref 0.5–1.9)

## 2017-03-11 MED ORDER — VANCOMYCIN HCL IN DEXTROSE 1-5 GM/200ML-% IV SOLN
1000.0000 mg | Freq: Once | INTRAVENOUS | Status: AC
  Administered 2017-03-11: 1000 mg via INTRAVENOUS
  Filled 2017-03-11: qty 200

## 2017-03-11 MED ORDER — VANCOMYCIN HCL IN DEXTROSE 1-5 GM/200ML-% IV SOLN: 1000.0000 mg | Freq: Two times a day (BID) | INTRAVENOUS | Status: DC

## 2017-03-11 MED ORDER — SODIUM CHLORIDE 0.9 % IV SOLN
1000.0000 mL | INTRAVENOUS | Status: DC
  Administered 2017-03-11 – 2017-03-14 (×6): 1000 mL via INTRAVENOUS

## 2017-03-11 MED ORDER — PIPERACILLIN-TAZOBACTAM 3.375 G IVPB 30 MIN
3.3750 g | Freq: Once | INTRAVENOUS | Status: AC
  Administered 2017-03-11: 3.375 g via INTRAVENOUS
  Filled 2017-03-11: qty 50

## 2017-03-11 NOTE — Progress Notes (Addendum)
Pharmacy Antibiotic Note  [name] [name] is a 83 y.o. female admitted on 03/11/2017 with sepsis.  Pharmacy has been consulted for vancomycin and zosyn dosing. -WBC= 13.2 -Zosyn 3.375gm IV and vancomycin 1g ordered in ED  Plan: -Vancomycin 1000mg  IV x1 (for a total load of 2000mg ) -Will follow labs for vancomycin maintenance dosing -Zosyn 3.375gm IV q8h -Will follow renal function, cultures and clinical progress   Height: 5\' 7"  (170.2 cm) Weight: 214 lb (97.1 kg) IBW/kg (Calculated) : 61.6  No data recorded.  Recent Labs  Lab 03/11/17 2132 03/11/17 2150  WBC 13.2*  --   LATICACIDVEN  --  1.58    CrCl cannot be calculated (Patient's most recent lab result is older than the maximum 21 days allowed.).    Allergies  Allergen Reactions  . Sulfonamide Derivatives     REACTION: rash  . Sulfur     hives    Antimicrobials this admission: 1/26 vanc 1/26 zosyn  Dose adjustments this admission:   Microbiology results:  Thank you for allowing pharmacy to be a part of this patient's care.  Hildred Laser, Pharm D 03/11/2017 10:07 PM   ADDENDUM: SCr 0.88, continue w/ vanc 1g IV Q12H. Wynona Neat, PharmD, BCPS 03/11/2017 11:01 PM

## 2017-03-11 NOTE — ED Triage Notes (Signed)
Pt from home via [facility]. Per family, pt has been really weak, has had dark, cloudy urine w/ a strong odor, & increasingly more confused than baseline. Pt has hx of Alzheimer's. A&O x1 on arrival.

## 2017-03-11 NOTE — ED Provider Notes (Signed)
[facility] EMERGENCY DEPARTMENT Provider Note   CSN: [id number] Arrival date & time: 03/11/17  2125     History   Chief Complaint Chief Complaint  Patient presents with  . Weakness    HPI [name] [name] is a 84 y.o. female with a history of Alzheimer's dementia, ventral tremor who presents today for evaluation of altered mental status.  According to her family they started noticing yesterday that she was not acting like her normal baseline and more confused than usual.  She is reportedly usually only oriented to self, not place or time.  They report that she was sitting in a recliner and did not get up overnight which is abnormal for her.  They noted that she had dark, foul-smelling urine and fevers at home.  They report that she has been taking all of her medications.  She lives with her husband.   HPI  Past Medical History:  Diagnosis Date  . Alzheimer's dementia   . Breast cancer ([place])   . Diverticulosis   . HTN (hypertension)   . Hyperlipidemia   . Osteopenia     Patient Active Problem List   Diagnosis Date Noted  . Acute metabolic encephalopathy 78/29/5621  . Insomnia 12/25/2015  . Counseling regarding end of life decision making 10/03/2014  . Controlled type 2 diabetes mellitus with diabetic neuropathy ([place]) 10/03/2014  . Bowel incontinence 01/30/2014  . Chronic venous insufficiency 08/15/2013  . Peripheral edema 07/30/2013  . Vitamin D deficiency 02/13/2013  . Neuropathy due to type 2 diabetes mellitus ([place]) 05/17/2011  . Uterine prolapse 11/29/2010  . Urine incontinence 11/29/2010  . Alzheimer's dementia without behavioral disturbance, moderate severe, 05/2016 12/30 07/05/2010  . Breast cancer in situ 07/30/2008    Class: Diagnosis of  . Ductal carcinoma in situ (DCIS) of left breast 07/11/2008  . SEBORRHEIC KERATOSIS 06/20/2007  . HYPERCHOLESTEROLEMIA 05/12/2006  . Essential hypertension, benign 05/12/2006  . DIVERTICULOSIS, COLON  05/12/2006  . VAGINITIS, ATROPHIC 05/12/2006  . Osteopenia 05/12/2006  . TREMOR, ESSENTIAL 09/20/2005  . SYNCOPE, HX OF 09/20/2005    Past Surgical History:  Procedure Laterality Date  . BREAST LUMPECTOMY  2007   left breast  . right cataract extraction      OB History    No data available       Home Medications    Prior to Admission medications   Medication Sig Start Date End Date Taking? Authorizing Provider  atenolol (TENORMIN) 25 MG tablet TAKE 1 TABLET BY MOUTH DAILY Patient taking differently: TAKE 25 mg TABLET BY MOUTH DAILY 12/26/16  Yes Bedsole, Amy E, MD  atorvastatin (LIPITOR) 20 MG tablet TAKE 4 TABLETS (80MG ) ONCE DAILY FOR CHOLESTEROL. *PATIENT NEEDS SMALLER TABSFOR SWALLOWING* 10/26/16  Yes Bedsole, Amy E, MD  diclofenac (VOLTAREN) 75 MG EC tablet TAKE 1 TABLET BY MOUTH TWICE A DAY Patient taking differently: TAKE 75 mg TABLET BY MOUTH TWICE A DAY 12/26/16  Yes Bedsole, Amy E, MD  galantamine (RAZADYNE) 12 MG tablet Take 1 tablet (12 mg total) by mouth 2 (two) times daily. 03/28/16  Yes Bedsole, Amy E, MD  lisinopril (PRINIVIL,ZESTRIL) 20 MG tablet TAKE 1 TABLET BY MOUTH DAILY Patient taking differently: TAKE 20 mg TABLET BY MOUTH DAILY 12/26/16  Yes Bedsole, Amy E, MD  Calcium Carbonate-Vitamin D (CALCIUM 600+D) 600-200 MG-UNIT TABS Take 1 tablet by mouth 2 (two) times daily.     [provider]  thiamine (VITAMIN B-1) 100 MG tablet Take 100 mg by mouth  daily.    [provider]    Family History Family History  Problem Relation Age of Onset  . Cancer Brother        Prostate  . Alzheimer's disease Mother   . Stroke Father   . Aneurysm Father   . Colon cancer Neg Hx     Social History Social History   Tobacco Use  . Smoking status: Former Smoker    Last attempt to quit: 02/14/1957    Years since quitting: 60.1  . Smokeless tobacco: Never Used  Substance Use Topics  . Alcohol use: No  . Drug use: No     Allergies   Sulfonamide  derivatives and Sulfur   Review of Systems Review of Systems  Unable to perform ROS: Dementia     Physical Exam Updated Vital Signs BP 135/81   Pulse 96   Temp (!) 100.7 F (38.2 C) (Rectal)   Resp (!) 30   Ht 5\' 7"  (1.702 m)   Wt 97.1 kg (214 lb)   SpO2 97%   BMI 33.52 kg/m   Physical Exam  Constitutional: She appears well-developed and well-nourished. No distress.  HENT:  Head: Normocephalic and atraumatic.  Mouth/Throat: Oropharynx is clear and moist.  Eyes: Conjunctivae are normal.  Neck: Neck supple.  Cardiovascular: Regular rhythm and intact distal pulses.  No murmur heard. Pulmonary/Chest: Effort normal and breath sounds normal. No respiratory distress.  Abdominal: Soft. There is no tenderness.  Musculoskeletal: She exhibits no edema.  Neurological: She is alert.  Oriented to person, not place or time.   Skin: Skin is warm and dry.  Psychiatric: She has a normal mood and affect.  Nursing note and vitals reviewed.    ED Treatments / Results  Labs (all labs ordered are listed, but only abnormal results are displayed) Labs Reviewed  COMPREHENSIVE METABOLIC PANEL - Abnormal; Notable for the following components:      Result Value   Sodium 134 (*)    Potassium 5.2 (*)    Chloride 100 (*)    CO2 21 (*)    Glucose, Bld 167 (*)    Total Bilirubin 2.1 (*)    GFR calc non Af Amer 60 (*)    All other components within normal limits  CBC WITH DIFFERENTIAL/PLATELET - Abnormal; Notable for the following components:   WBC 13.2 (*)    Neutro Abs 10.8 (*)    All other components within normal limits  URINALYSIS, ROUTINE W REFLEX MICROSCOPIC - Abnormal; Notable for the following components:   APPearance TURBID (*)    Hgb urine dipstick SMALL (*)    Protein, ur 100 (*)    Nitrite POSITIVE (*)    Leukocytes, UA LARGE (*)    Bacteria, UA MANY (*)    Non Squamous Epithelial 0-5 (*)    All other components within normal limits  CULTURE, BLOOD (ROUTINE X 2)    CULTURE, BLOOD (ROUTINE X 2)  URINE CULTURE  PROTIME-INR  I-STAT CG4 LACTIC ACID, ED  I-STAT CG4 LACTIC ACID, ED  I-STAT CG4 LACTIC ACID, ED  I-STAT CG4 LACTIC ACID, ED    EKG  EKG Interpretation None       Radiology Dg Chest Portable 1 View  Result Date: 03/11/2017 CLINICAL DATA:  Suspected sepsis EXAM: PORTABLE CHEST 1 VIEW COMPARISON:  07/15/2008 FINDINGS: Borderline cardiomegaly. Mild central vascular congestion. Mild diffuse interstitial prominence suggesting edema. No focal consolidation or effusion. No pneumothorax. Aortic atherosclerosis IMPRESSION: 1. Low lung volumes. 2. Borderline to  mild cardiomegaly with mild central vascular congestion and diffuse interstitial opacities suggesting minimal edema 3. No focal pulmonary infiltrate Electronically Signed   By: Donavan Foil M.D.   On: 03/11/2017 23:13    Procedures Procedures (including critical care time)  Medications Ordered in ED Medications  0.9 %  sodium chloride infusion (1,000 mLs Intravenous New Bag/Given 03/11/17 2212)  vancomycin (VANCOCIN) IVPB 1000 mg/200 mL premix (not administered)  piperacillin-tazobactam (ZOSYN) IVPB 3.375 g (0 g Intravenous Stopped 03/11/17 2259)  vancomycin (VANCOCIN) IVPB 1000 mg/200 mL premix (1,000 mg Intravenous New Bag/Given 03/11/17 2228)  vancomycin (VANCOCIN) IVPB 1000 mg/200 mL premix (1,000 mg Intravenous New Bag/Given 03/11/17 2229)     Initial Impression / Assessment and Plan / ED Course  I have reviewed the triage vital signs and the nursing notes.  Pertinent labs & imaging results that were available during my care of the patient were reviewed by me and considered in my medical decision making (see chart for details).  Clinical Course as of Mar 12 18  Sat Mar 11, 2017  2300 Sepsis reassessment compleated  [EH]  Sun Mar 12, 2017  0018 Spoke with Dr. Blaine Hamper who will come see patient.   [EH]    Clinical Course User Index [EH] Lorin Glass, PA-C   Hassan Rowan  present presents today from home for evaluation of increased confusion, and dark cloudy urine with strong odor.  On arrival she was found to be febrile with a rectal temp of 100.7, and tachycardic in the 110s.  Code sepsis was called, patient was started on broad-spectrum antibiotics, as still unknown source, suspect UTI.  Patient with out hypotension or lactic acid over 4 so 38ml/kg fluid bolus not started.  1 liter fluid bolus ordered which helped bring her HR down. WBC elevated 13.2, neutrophil 10.8.  Urine with Large leukocytes, many bacteria, nitrite positive, large blood in urine.  Blood and urine cultures were obtained.    This patient was discussed with and seen by Dr. Thomasene Lot who evaluated the patient and agreed with my plan to admit.     Final Clinical Impressions(s) / ED Diagnoses   Final diagnoses:  Sepsis, due to unspecified organism [facility])  Lower urinary tract infectious disease    ED Discharge Orders    None       Ollen Gross 03/12/17 0021    Macarthur Critchley, MD 03/15/17 [phone]

## 2017-03-11 NOTE — ED Notes (Signed)
RN notified of Sepsis  

## 2017-03-12 ENCOUNTER — Observation Stay (HOSPITAL_COMMUNITY): Payer: Medicare Other

## 2017-03-12 ENCOUNTER — Other Ambulatory Visit: Payer: Self-pay

## 2017-03-12 DIAGNOSIS — E7800 Pure hypercholesterolemia, unspecified: Secondary | ICD-10-CM | POA: Diagnosis not present

## 2017-03-12 DIAGNOSIS — R4182 Altered mental status, unspecified: Secondary | ICD-10-CM | POA: Diagnosis not present

## 2017-03-12 DIAGNOSIS — Z87891 Personal history of nicotine dependence: Secondary | ICD-10-CM | POA: Diagnosis not present

## 2017-03-12 DIAGNOSIS — N390 Urinary tract infection, site not specified: Secondary | ICD-10-CM | POA: Diagnosis present

## 2017-03-12 DIAGNOSIS — N3000 Acute cystitis without hematuria: Secondary | ICD-10-CM | POA: Diagnosis not present

## 2017-03-12 DIAGNOSIS — F0280 Dementia in other diseases classified elsewhere without behavioral disturbance: Secondary | ICD-10-CM | POA: Diagnosis not present

## 2017-03-12 DIAGNOSIS — A419 Sepsis, unspecified organism: Secondary | ICD-10-CM | POA: Diagnosis not present

## 2017-03-12 DIAGNOSIS — R319 Hematuria, unspecified: Secondary | ICD-10-CM | POA: Diagnosis not present

## 2017-03-12 DIAGNOSIS — R278 Other lack of coordination: Secondary | ICD-10-CM | POA: Diagnosis not present

## 2017-03-12 DIAGNOSIS — G9341 Metabolic encephalopathy: Secondary | ICD-10-CM | POA: Diagnosis not present

## 2017-03-12 DIAGNOSIS — E875 Hyperkalemia: Secondary | ICD-10-CM | POA: Diagnosis present

## 2017-03-12 DIAGNOSIS — Z820 Family history of epilepsy and other diseases of the nervous system: Secondary | ICD-10-CM | POA: Diagnosis not present

## 2017-03-12 DIAGNOSIS — C50919 Malignant neoplasm of unspecified site of unspecified female breast: Secondary | ICD-10-CM | POA: Diagnosis not present

## 2017-03-12 DIAGNOSIS — R262 Difficulty in walking, not elsewhere classified: Secondary | ICD-10-CM | POA: Diagnosis not present

## 2017-03-12 DIAGNOSIS — Z853 Personal history of malignant neoplasm of breast: Secondary | ICD-10-CM | POA: Diagnosis not present

## 2017-03-12 DIAGNOSIS — G309 Alzheimer's disease, unspecified: Secondary | ICD-10-CM | POA: Diagnosis not present

## 2017-03-12 DIAGNOSIS — J811 Chronic pulmonary edema: Secondary | ICD-10-CM | POA: Diagnosis present

## 2017-03-12 DIAGNOSIS — R2689 Other abnormalities of gait and mobility: Secondary | ICD-10-CM | POA: Diagnosis not present

## 2017-03-12 DIAGNOSIS — Z9841 Cataract extraction status, right eye: Secondary | ICD-10-CM | POA: Diagnosis not present

## 2017-03-12 DIAGNOSIS — I10 Essential (primary) hypertension: Secondary | ICD-10-CM | POA: Diagnosis present

## 2017-03-12 DIAGNOSIS — Z882 Allergy status to sulfonamides status: Secondary | ICD-10-CM | POA: Diagnosis not present

## 2017-03-12 DIAGNOSIS — Z791 Long term (current) use of non-steroidal anti-inflammatories (NSAID): Secondary | ICD-10-CM | POA: Diagnosis not present

## 2017-03-12 DIAGNOSIS — R6520 Severe sepsis without septic shock: Secondary | ICD-10-CM | POA: Diagnosis not present

## 2017-03-12 DIAGNOSIS — E785 Hyperlipidemia, unspecified: Secondary | ICD-10-CM | POA: Diagnosis present

## 2017-03-12 DIAGNOSIS — Z79899 Other long term (current) drug therapy: Secondary | ICD-10-CM | POA: Diagnosis not present

## 2017-03-12 DIAGNOSIS — E119 Type 2 diabetes mellitus without complications: Secondary | ICD-10-CM | POA: Diagnosis not present

## 2017-03-12 DIAGNOSIS — R41841 Cognitive communication deficit: Secondary | ICD-10-CM | POA: Diagnosis not present

## 2017-03-12 DIAGNOSIS — M6281 Muscle weakness (generalized): Secondary | ICD-10-CM | POA: Diagnosis not present

## 2017-03-12 DIAGNOSIS — E1140 Type 2 diabetes mellitus with diabetic neuropathy, unspecified: Secondary | ICD-10-CM | POA: Diagnosis not present

## 2017-03-12 DIAGNOSIS — M8580 Other specified disorders of bone density and structure, unspecified site: Secondary | ICD-10-CM | POA: Diagnosis present

## 2017-03-12 LAB — PROCALCITONIN: PROCALCITONIN: 0.37 ng/mL

## 2017-03-12 LAB — COMPREHENSIVE METABOLIC PANEL
ALT: 16 U/L (ref 14–54)
AST: 19 U/L (ref 15–41)
Albumin: 3.2 g/dL — ABNORMAL LOW (ref 3.5–5.0)
Alkaline Phosphatase: 83 U/L (ref 38–126)
Anion gap: 12 (ref 5–15)
BILIRUBIN TOTAL: 0.8 mg/dL (ref 0.3–1.2)
BUN: 18 mg/dL (ref 6–20)
CO2: 21 mmol/L — ABNORMAL LOW (ref 22–32)
CREATININE: 0.93 mg/dL (ref 0.44–1.00)
Calcium: 8.4 mg/dL — ABNORMAL LOW (ref 8.9–10.3)
Chloride: 101 mmol/L (ref 101–111)
GFR, EST NON AFRICAN AMERICAN: 56 mL/min — AB (ref >60)
Glucose, Bld: 170 mg/dL — ABNORMAL HIGH (ref 65–99)
Potassium: 3.9 mmol/L (ref 3.5–5.1)
Sodium: 134 mmol/L — ABNORMAL LOW (ref 135–145)
TOTAL PROTEIN: 6.3 g/dL — AB (ref 6.5–8.1)

## 2017-03-12 LAB — BASIC METABOLIC PANEL
Anion gap: 9 (ref 5–15)
BUN: 18 mg/dL (ref 6–20)
CHLORIDE: 102 mmol/L (ref 101–111)
CO2: 23 mmol/L (ref 22–32)
CREATININE: 0.86 mg/dL (ref 0.44–1.00)
Calcium: 8.2 mg/dL — ABNORMAL LOW (ref 8.9–10.3)
GFR calc non Af Amer: >60 mL/min (ref >60)
Glucose, Bld: 146 mg/dL — ABNORMAL HIGH (ref 65–99)
POTASSIUM: 3.7 mmol/L (ref 3.5–5.1)
Sodium: 134 mmol/L — ABNORMAL LOW (ref 135–145)

## 2017-03-12 LAB — CBC
HEMATOCRIT: 37.3 % (ref 36.0–46.0)
HEMOGLOBIN: 12 g/dL (ref 12.0–15.0)
MCH: 30.6 pg (ref 26.0–34.0)
MCHC: 32.2 g/dL (ref 30.0–36.0)
MCV: 95.2 fL (ref 78.0–100.0)
PLATELETS: 218 10*3/uL (ref 150–400)
RBC: 3.92 MIL/uL (ref 3.87–5.11)
RDW: 14 % (ref 11.5–15.5)
WBC: 12 10*3/uL — AB (ref 4.0–10.5)

## 2017-03-12 LAB — I-STAT CG4 LACTIC ACID, ED: Lactic Acid, Venous: 2.11 mmol/L (ref 0.5–1.9)

## 2017-03-12 LAB — CBG MONITORING, ED: Glucose-Capillary: 107 mg/dL — ABNORMAL HIGH (ref 65–99)

## 2017-03-12 LAB — BRAIN NATRIURETIC PEPTIDE: B NATRIURETIC PEPTIDE 5: 25.6 pg/mL (ref 0.0–100.0)

## 2017-03-12 MED ORDER — ONDANSETRON HCL 4 MG/2ML IJ SOLN
4.0000 mg | Freq: Three times a day (TID) | INTRAMUSCULAR | Status: DC | PRN
Start: 2017-03-12 — End: 2017-03-15

## 2017-03-12 MED ORDER — ATENOLOL 50 MG PO TABS
25.0000 mg | ORAL_TABLET | Freq: Every day | ORAL | Status: DC
  Administered 2017-03-12 – 2017-03-15 (×4): 25 mg via ORAL
  Filled 2017-03-12 (×4): qty 1

## 2017-03-12 MED ORDER — ENOXAPARIN SODIUM 40 MG/0.4ML ~~LOC~~ SOLN
40.0000 mg | SUBCUTANEOUS | Status: DC
  Administered 2017-03-12 – 2017-03-15 (×4): 40 mg via SUBCUTANEOUS
  Filled 2017-03-12 (×5): qty 0.4

## 2017-03-12 MED ORDER — POLYETHYLENE GLYCOL 3350 17 G PO PACK
17.0000 g | PACK | Freq: Every day | ORAL | Status: DC | PRN
  Administered 2017-03-15: 17 g via ORAL

## 2017-03-12 MED ORDER — ATORVASTATIN CALCIUM 80 MG PO TABS
80.0000 mg | ORAL_TABLET | Freq: Every day | ORAL | Status: DC
  Administered 2017-03-12 – 2017-03-14 (×3): 80 mg via ORAL
  Filled 2017-03-12 (×4): qty 1

## 2017-03-12 MED ORDER — ACETAMINOPHEN 325 MG PO TABS: 650.0000 mg | ORAL_TABLET | Freq: Four times a day (QID) | ORAL | Status: DC | PRN

## 2017-03-12 MED ORDER — LISINOPRIL 20 MG PO TABS
20.0000 mg | ORAL_TABLET | Freq: Every day | ORAL | Status: DC
  Administered 2017-03-12 – 2017-03-15 (×4): 20 mg via ORAL
  Filled 2017-03-12 (×4): qty 1

## 2017-03-12 MED ORDER — HYDRALAZINE HCL 20 MG/ML IJ SOLN: 5.0000 mg | INTRAMUSCULAR | Status: DC | PRN

## 2017-03-12 MED ORDER — DICLOFENAC SODIUM 75 MG PO TBEC
75.0000 mg | DELAYED_RELEASE_TABLET | Freq: Two times a day (BID) | ORAL | Status: DC
  Administered 2017-03-12 – 2017-03-15 (×8): 75 mg via ORAL
  Filled 2017-03-12 (×9): qty 1

## 2017-03-12 MED ORDER — SODIUM POLYSTYRENE SULFONATE 15 GM/60ML PO SUSP
15.0000 g | Freq: Once | ORAL | Status: DC
  Filled 2017-03-12: qty 60

## 2017-03-12 MED ORDER — SODIUM CHLORIDE 0.9 % IV BOLUS (SEPSIS)
1000.0000 mL | Freq: Once | INTRAVENOUS | Status: AC
  Administered 2017-03-12: 1000 mL via INTRAVENOUS

## 2017-03-12 MED ORDER — VITAMIN B-1 100 MG PO TABS
100.0000 mg | ORAL_TABLET | Freq: Every day | ORAL | Status: DC
  Administered 2017-03-12 – 2017-03-15 (×4): 100 mg via ORAL
  Filled 2017-03-12 (×4): qty 1

## 2017-03-12 MED ORDER — DEXTROSE 5 % IV SOLN
1.0000 g | INTRAVENOUS | Status: DC
  Administered 2017-03-12 – 2017-03-15 (×4): 1 g via INTRAVENOUS
  Filled 2017-03-12 (×4): qty 10

## 2017-03-12 MED ORDER — ZOLPIDEM TARTRATE 5 MG PO TABS
5.0000 mg | ORAL_TABLET | Freq: Every evening | ORAL | Status: DC | PRN
  Administered 2017-03-12 – 2017-03-14 (×3): 5 mg via ORAL
  Filled 2017-03-12 (×3): qty 1

## 2017-03-12 MED ORDER — GALANTAMINE HYDROBROMIDE 4 MG PO TABS
12.0000 mg | ORAL_TABLET | Freq: Two times a day (BID) | ORAL | Status: DC
  Administered 2017-03-12 – 2017-03-15 (×8): 12 mg via ORAL
  Filled 2017-03-12 (×10): qty 3

## 2017-03-12 MED ORDER — CALCIUM CARBONATE-VITAMIN D 500-200 MG-UNIT PO TABS
1.0000 | ORAL_TABLET | Freq: Two times a day (BID) | ORAL | Status: DC
  Administered 2017-03-12 – 2017-03-15 (×7): 1 via ORAL
  Filled 2017-03-12 (×9): qty 1

## 2017-03-12 NOTE — Progress Notes (Signed)
[facility]                                                                              Patient Demographics  [name] [name], is a 84 y.o. female, DOB - 07-26-1933, WIO:[id number]  Admit date - 03/11/2017   Admitting Physician Ivor Costa, MD  Outpatient Primary MD for the patient is Jinny Sanders, MD  Outpatient specialists:   LOS - 0  days    Chief Complaint  Patient presents with  . Weakness       Brief summary  [name] [name] is a 84 y.o. female with medical history significant of hypertension, hyperlipidemia, diet-controlled diabetes, Alzheimer disease, breast cancer (left lumpectomy), presenting with altered mental status change due to sepsis of urinary source     Assessment & Plan    Principal Problem:   UTI (urinary tract infection) Active Problems:   HYPERCHOLESTEROLEMIA   Alzheimer's dementia without behavioral disturbance, moderate severe, 05/2016 12/30   Controlled type 2 diabetes mellitus with diabetic neuropathy ([place])   Acute metabolic encephalopathy   Hyperkalemia   Sepsis ([place])  #1 sepsis: Criteria-leukocytosis, tachycardia and tachypnea.  Lactic acid normal IV antibiotics Source -  UTI IV antibiotics IV fluids Check pro calcitonin and trend lactic acid levels Follow-up blood and urine cultures  #2 acute confusion:  Due to infectious and metabolic etiology/encephalopathy Head CT negative Supportive care  #3 hyperkalemia:  Mild Kayexalate 15 g x1 given Follow clinically   #4 hypertension: Continue home meds regimen as needed hydralazine IV     Code Status: Full code DVT Prophylaxis:  Lovenox Family Communication:   Disposition Plan: To be determined  Time Spent in minutes   33 minutes  Procedures:    Consultants:     Antimicrobials:   Rocephin   Medications  Scheduled Meds: . atenolol  25 mg Oral Daily  . atorvastatin  80 mg Oral q1800  . calcium-vitamin D  1 tablet Oral BID WC  . diclofenac  75 mg  Oral BID  . enoxaparin (LOVENOX) injection  40 mg Subcutaneous Q24H  . galantamine  12 mg Oral BID  . lisinopril  20 mg Oral Daily  . sodium polystyrene  15 g Oral Once  . thiamine  100 mg Oral Daily   Continuous Infusions: . sodium chloride 1,000 mL (03/12/17 0642)  . cefTRIAXone (ROCEPHIN)  IV Stopped (03/12/17 4268)   PRN Meds:.acetaminophen, hydrALAZINE, ondansetron (ZOFRAN) IV, polyethylene glycol, zolpidem   Antibiotics   Anti-infectives (From admission, onward)   Start     Dose/Rate Route Frequency Ordered Stop   03/12/17 1000  vancomycin (VANCOCIN) IVPB 1000 mg/200 mL premix  Status:  Discontinued     1,000 mg 200 mL/hr over 60 Minutes Intravenous Every 12 hours 03/11/17 2301 03/12/17 0030   03/12/17 0600  cefTRIAXone (ROCEPHIN) 1 g in dextrose 5 % 50 mL IVPB     1 g 100 mL/hr over 30 Minutes Intravenous Every 24 hours 03/12/17 0032     03/11/17 2215  vancomycin (VANCOCIN) IVPB 1000 mg/200 mL premix     1,000 mg 200 mL/hr over 60 Minutes Intravenous  Once 03/11/17 2208  03/11/17 2329   03/11/17 2200  piperacillin-tazobactam (ZOSYN) IVPB 3.375 g     3.375 g 100 mL/hr over 30 Minutes Intravenous  Once 03/11/17 2150 03/11/17 2259   03/11/17 2200  vancomycin (VANCOCIN) IVPB 1000 mg/200 mL premix     1,000 mg 200 mL/hr over 60 Minutes Intravenous  Once 03/11/17 2150 03/11/17 2328        Subjective:   [name] was seen and examined today.  Patient denies dizziness, chest pain, shortness of breath. No acute events overnight.    Objective:   Vitals:   03/12/17 0600 03/12/17 0700 03/12/17 0800 03/12/17 0900  BP: 136/72 118/61 126/66 (!) 127/56  Pulse: 80 74 73 72  Resp: 17 18 12 16   Temp:      TempSrc:      SpO2: 98% 97% 99% 96%  Weight:      Height:        Intake/Output Summary (Last 24 hours) at 03/12/2017 1002 Last data filed at 03/12/2017 1194 Gross per 24 hour  Intake 1300 ml  Output -  Net 1300 ml     Wt Readings from Last 3 Encounters:  03/11/17  97.1 kg (214 lb)  10/14/16 97.4 kg (214 lb 12 oz)  10/14/16 97.4 kg (214 lb 12 oz)     Exam  General: NAD  HEENT: NCAT,  PERRL,MMM  Neck: SUPPLE, (-) JVD  Cardiovascular: RRR, (-) GALLOP, (-) MURMUR  Respiratory: CTA  Gastrointestinal: SOFT, (-) DISTENSION, BS(+), (_) TENDERNESS  Ext: (-) CYANOSIS, (-) EDEMA  Neuro: A, OX 2  Skin:(-) RASH  Psych:NORMAL AFFECT/MOOD   Data Reviewed:  I have personally reviewed following labs and imaging studies  Micro Results No results found for this or any previous visit (from the past 240 hour(s)).  Radiology Reports Ct Head Wo Contrast  Result Date: 03/12/2017 CLINICAL DATA:  Altered mental status EXAM: CT HEAD WITHOUT CONTRAST TECHNIQUE: Contiguous axial images were obtained from the base of the skull through the vertex without intravenous contrast. COMPARISON:  Head CT 01/09/2007 FINDINGS: Brain: No mass lesion, intraparenchymal hemorrhage or extra-axial collection. No evidence of acute cortical infarct. There is periventricular hypoattenuation compatible with chronic microvascular disease. Generalized volume loss. Vascular: No hyperdense vessel or unexpected calcification. Skull: Small left frontal osteoma. Sinuses/Orbits: No sinus fluid levels or advanced mucosal thickening. No mastoid effusion. Normal orbits. IMPRESSION: Volume loss and chronic microvascular disease without acute intracranial abnormality. Electronically Signed   By: Ulyses Jarred M.D.   On: 03/12/2017 01:40   Dg Chest Portable 1 View  Result Date: 03/11/2017 CLINICAL DATA:  Suspected sepsis EXAM: PORTABLE CHEST 1 VIEW COMPARISON:  07/15/2008 FINDINGS: Borderline cardiomegaly. Mild central vascular congestion. Mild diffuse interstitial prominence suggesting edema. No focal consolidation or effusion. No pneumothorax. Aortic atherosclerosis IMPRESSION: 1. Low lung volumes. 2. Borderline to mild cardiomegaly with mild central vascular congestion and diffuse interstitial  opacities suggesting minimal edema 3. No focal pulmonary infiltrate Electronically Signed   By: Donavan Foil M.D.   On: 03/11/2017 23:13    Lab Data:  CBC: Recent Labs  Lab 03/11/17 2132 03/12/17 0557  WBC 13.2* 12.0*  NEUTROABS 10.8*  --   HGB 14.5 12.0  HCT 43.5 37.3  MCV 95.2 95.2  PLT 240 174   Basic Metabolic Panel: Recent Labs  Lab 03/11/17 2132 03/12/17 0117 03/12/17 0557  NA 134* 134* 134*  K 5.2* 3.9 3.7  CL 100* 101 102  CO2 21* 21* 23  GLUCOSE 167* 170* 146*  BUN  18 18 18   CREATININE 0.88 0.93 0.86  CALCIUM 9.0 8.4* 8.2*   GFR: Estimated Creatinine Clearance: 61.4 mL/min (by C-G formula based on SCr of 0.86 mg/dL). Liver Function Tests: Recent Labs  Lab 03/11/17 2132 03/12/17 0117  AST 38 19  ALT 18 16  ALKPHOS 97 83  BILITOT 2.1* 0.8  PROT 7.2 6.3*  ALBUMIN 3.5 3.2*   No results for input(s): LIPASE, AMYLASE in the last 168 hours. No results for input(s): AMMONIA in the last 168 hours. Coagulation Profile: Recent Labs  Lab 03/11/17 2132  INR 0.97   Cardiac Enzymes: No results for input(s): CKTOTAL, CKMB, CKMBINDEX, TROPONINI in the last 168 hours. BNP (last 3 results) No results for input(s): PROBNP in the last 8760 hours. HbA1C: No results for input(s): HGBA1C in the last 72 hours. CBG: Recent Labs  Lab 03/12/17 0921  GLUCAP 107*   Lipid Profile: No results for input(s): CHOL, HDL, LDLCALC, TRIG, CHOLHDL, LDLDIRECT in the last 72 hours. Thyroid Function Tests: No results for input(s): TSH, T4TOTAL, FREET4, T3FREE, THYROIDAB in the last 72 hours. Anemia Panel: No results for input(s): VITAMINB12, FOLATE, FERRITIN, TIBC, IRON, RETICCTPCT in the last 72 hours. Urine analysis:    Component Value Date/Time   COLORURINE YELLOW 03/11/2017 2300   APPEARANCEUR TURBID (A) 03/11/2017 2300   LABSPEC 1.020 03/11/2017 2300   PHURINE 5.0 03/11/2017 2300   GLUCOSEU NEGATIVE 03/11/2017 2300   HGBUR SMALL (A) 03/11/2017 2300   BILIRUBINUR  NEGATIVE 03/11/2017 2300   BILIRUBINUR negative 01/30/2014 1455   KETONESUR NEGATIVE 03/11/2017 2300   PROTEINUR 100 (A) 03/11/2017 2300   UROBILINOGEN 0.2 01/30/2014 1455   UROBILINOGEN 0.2 01/09/2007 2350   NITRITE POSITIVE (A) 03/11/2017 2300   LEUKOCYTESUR LARGE (A) 03/11/2017 2300     OSEI-BONSU,Kolby Myung M.D. [facility] 03/12/2017, 10:02 AM  Pager: [phone] Between 7am to 7pm - call Pager - [phone]  After 7pm go to [email or web address] - password [email or web address]  Call night coverage person covering after 7pm

## 2017-03-12 NOTE — Progress Notes (Signed)
[name] [name] is a 84 y.o. female patient admitted from ED awake, alert - oriented  X 4 - no acute distress noted.  VSS - Blood pressure (!) 139/56, pulse 75, temperature 97.7 F (36.5 C), temperature source Oral, resp. rate 20, height 5\' 7"  (1.702 m), weight 97.1 kg (214 lb), SpO2 98 %.    IV in place, occlusive dsg intact without redness.  Orientation to room, and floor completed with information packet given to patient/family.  Patient declined safety video at this time.  Admission INP armband ID verified with patient/family, and in place.   SR up x 2, fall assessment complete, with patient and family able to verbalize understanding of risk associated with falls, and verbalized understanding to call nsg before up out of bed.  Call light within reach, patient able to voice, and demonstrate understanding.      Will cont to eval and treat per MD orders.  Richardean Chimera, RN 03/12/2017 4:37 PM

## 2017-03-12 NOTE — ED Notes (Signed)
Delay in lab draw pt currently in CT

## 2017-03-12 NOTE — ED Notes (Addendum)
Pt took bp cuff off and attempting to pull off other vital sign equipment. Blood pressures inaccurate due to pt constant agitation. Cuff taken off and will attempt to put cuff on after pt meds have kicked in. Transferred to hospital bed for increased comfort.

## 2017-03-12 NOTE — H&P (Signed)
History and Physical    [name] [name] IOE:[id number] DOB: [birth year] DOA: 03/11/2017  Referring MD/NP/PA:   PCP: [name] Sanders, MD   Patient coming from:  The patient is coming from home.  At baseline, pt partially independent for most of ADL.   Chief Complaint: AMS and generalized weakness  HPI: [name] [name] is a 84 y.o. female with medical history significant of hypertension, hyperlipidemia, diet-controlled diabetes, Alzheimer disease, breast cancer (left lumpectomy), who presents with altered mental status and generalized weakness.  Per patient's husband and daughter-in-law, patient has some level of confusion at baseline due to Alzheimer's disease, but she was noted to be more confused since yesterday. She ha generalized weakness, but no unilateral weakness, numbness or tingling to extremities. No facial droop, slurred speech. No recent fall. Patient does not have chest pain, shortness breath, cough, fever or chills. She has nausea, but no vomiting, diarrhea or abdominal pain. No sure if she has any symptoms of UTI.  ED Course: pt was found to have positive urinalysis with large amount of leukocytes and positive nitrite, WBC 13.2, lactic acid 1.58, INR 0.97, potassium of 5.2, creatinine normal, temperature 100.7, tachycardia, tachypnea, oxygen saturation 97% on room air, chest x-ray showed mild pulmonary edema. Patient is placed on telemetry bed for observation.  Review of Systems: Could not be reviewed due to altered mental status and examined.  Allergy:  Allergies  Allergen Reactions  . Sulfonamide Derivatives     REACTION: rash  . Sulfur     hives    Past Medical History:  Diagnosis Date  . Alzheimer's dementia   . Breast cancer ([place])   . Diverticulosis   . HTN (hypertension)   . Hyperlipidemia   . Osteopenia     Past Surgical History:  Procedure Laterality Date  . BREAST LUMPECTOMY  2007   left breast  . right cataract extraction      Social History:  reports  that she quit smoking about 60 years ago. she has never used smokeless tobacco. She reports that she does not drink alcohol or use drugs.  Family History:  Family History  Problem Relation Age of Onset  . Cancer Brother        Prostate  . Alzheimer's disease Mother   . Stroke Father   . Aneurysm Father   . Colon cancer Neg Hx      Prior to Admission medications   Medication Sig Start Date End Date Taking? Authorizing Provider  atenolol (TENORMIN) 25 MG tablet TAKE 1 TABLET BY MOUTH DAILY Patient taking differently: TAKE 25 mg TABLET BY MOUTH DAILY 12/26/16  Yes Bedsole, Amy E, MD  atorvastatin (LIPITOR) 20 MG tablet TAKE 4 TABLETS (80MG ) ONCE DAILY FOR CHOLESTEROL. *PATIENT NEEDS SMALLER TABSFOR SWALLOWING* 10/26/16  Yes Bedsole, Amy E, MD  diclofenac (VOLTAREN) 75 MG EC tablet TAKE 1 TABLET BY MOUTH TWICE A DAY Patient taking differently: TAKE 75 mg TABLET BY MOUTH TWICE A DAY 12/26/16  Yes Bedsole, Amy E, MD  galantamine (RAZADYNE) 12 MG tablet Take 1 tablet (12 mg total) by mouth 2 (two) times daily. 03/28/16  Yes Bedsole, Amy E, MD  lisinopril (PRINIVIL,ZESTRIL) 20 MG tablet TAKE 1 TABLET BY MOUTH DAILY Patient taking differently: TAKE 20 mg TABLET BY MOUTH DAILY 12/26/16  Yes Bedsole, Amy E, MD  Calcium Carbonate-Vitamin D (CALCIUM 600+D) 600-200 MG-UNIT TABS Take 1 tablet by mouth 2 (two) times daily.     [provider]  thiamine (VITAMIN B-1) 100 MG tablet  Take 100 mg by mouth daily.    [provider]    Physical Exam: Vitals:   03/11/17 2130 03/11/17 2200 03/11/17 2230 03/11/17 2300  BP:  (!) 107/95 (!) 128/110 135/81  Pulse: (!) 114 (!) 112 100 96  Resp:  (!) 22 (!) 22 (!) 30  Temp: (!) 100.7 F (38.2 C)     TempSrc: Rectal     SpO2: 93% 93% 96% 97%  Weight: 97.1 kg (214 lb)     Height: 5\' 7"  (1.702 m)      General: Not in acute distress HEENT:       Eyes: PERRL, EOMI, no scleral icterus.       ENT: No discharge from the ears and nose, no pharynx  injection, no tonsillar enlargement.        Neck: No JVD, no bruit, no mass felt. Heme: No neck lymph node enlargement. Cardiac: S1/S2, RRR, No murmurs, No gallops or rubs. Respiratory: No rales, wheezing, rhonchi or rubs. GI: Soft, nondistended, nontender, no rebound pain, no organomegaly, BS present. GU: No hematuria Ext: No pitting leg edema bilaterally. 2+DP/PT pulse bilaterally. Musculoskeletal: No joint deformities, No joint redness or warmth, no limitation of ROM in spin. Skin: No rashes.  Neuro: confused, knows her daughter-in-law's name, not oriented to time and place. cranial nerves II-XII grossly intact, moves all extremities normally.  Psych: Patient is not psychotic, no suicidal or hemocidal ideation.  Labs on Admission: I have personally reviewed following labs and imaging studies  CBC: Recent Labs  Lab 03/11/17 2132  WBC 13.2*  NEUTROABS 10.8*  HGB 14.5  HCT 43.5  MCV 95.2  PLT 240   Basic Metabolic Panel: Recent Labs  Lab 03/11/17 2132  NA 134*  K 5.2*  CL 100*  CO2 21*  GLUCOSE 167*  BUN 18  CREATININE 0.88  CALCIUM 9.0   GFR: Estimated Creatinine Clearance: 60 mL/min (by C-G formula based on SCr of 0.88 mg/dL). Liver Function Tests: Recent Labs  Lab 03/11/17 2132  AST 38  ALT 18  ALKPHOS 97  BILITOT 2.1*  PROT 7.2  ALBUMIN 3.5   No results for input(s): LIPASE, AMYLASE in the last 168 hours. No results for input(s): AMMONIA in the last 168 hours. Coagulation Profile: Recent Labs  Lab 03/11/17 2132  INR 0.97   Cardiac Enzymes: No results for input(s): CKTOTAL, CKMB, CKMBINDEX, TROPONINI in the last 168 hours. BNP (last 3 results) No results for input(s): PROBNP in the last 8760 hours. HbA1C: No results for input(s): HGBA1C in the last 72 hours. CBG: No results for input(s): GLUCAP in the last 168 hours. Lipid Profile: No results for input(s): CHOL, HDL, LDLCALC, TRIG, CHOLHDL, LDLDIRECT in the last 72 hours. Thyroid Function  Tests: No results for input(s): TSH, T4TOTAL, FREET4, T3FREE, THYROIDAB in the last 72 hours. Anemia Panel: No results for input(s): VITAMINB12, FOLATE, FERRITIN, TIBC, IRON, RETICCTPCT in the last 72 hours. Urine analysis:    Component Value Date/Time   COLORURINE YELLOW 03/11/2017 2300   APPEARANCEUR TURBID (A) 03/11/2017 2300   LABSPEC 1.020 03/11/2017 2300   PHURINE 5.0 03/11/2017 2300   GLUCOSEU NEGATIVE 03/11/2017 2300   HGBUR SMALL (A) 03/11/2017 2300   BILIRUBINUR NEGATIVE 03/11/2017 2300   BILIRUBINUR negative 01/30/2014 1455   KETONESUR NEGATIVE 03/11/2017 2300   PROTEINUR 100 (A) 03/11/2017 2300   UROBILINOGEN 0.2 01/30/2014 1455   UROBILINOGEN 0.2 01/09/2007 2350   NITRITE POSITIVE (A) 03/11/2017 2300   LEUKOCYTESUR LARGE (A) 03/11/2017 2300  Sepsis Labs: @LABRCNTIP (procalcitonin:4,lacticidven:4) )No results found for this or any previous visit (from the past 240 hour(s)).   Radiological Exams on Admission: Dg Chest Portable 1 View  Result Date: 03/11/2017 CLINICAL DATA:  Suspected sepsis EXAM: PORTABLE CHEST 1 VIEW COMPARISON:  07/15/2008 FINDINGS: Borderline cardiomegaly. Mild central vascular congestion. Mild diffuse interstitial prominence suggesting edema. No focal consolidation or effusion. No pneumothorax. Aortic atherosclerosis IMPRESSION: 1. Low lung volumes. 2. Borderline to mild cardiomegaly with mild central vascular congestion and diffuse interstitial opacities suggesting minimal edema 3. No focal pulmonary infiltrate Electronically Signed   By: Donavan Foil M.D.   On: 03/11/2017 23:13     EKG: Not done in ED, will get one.   Assessment/Plan Principal Problem:   UTI (urinary tract infection) Active Problems:   HYPERCHOLESTEROLEMIA   Alzheimer's dementia without behavioral disturbance, moderate severe, 05/2016 12/30   Controlled type 2 diabetes mellitus with diabetic neuropathy ([place])   Acute metabolic encephalopathy   Hyperkalemia   Sepsis  ([place])   UTI and sepsis: Patient meets criteria for sepsis with leukocytosis, tachycardia and tachypnea. Lactic acid is normal. Hemodynamically stable. -  Place on med-surg bed for obs -  Ceftriaxone by IV (patient received one dose of vancomycin and Zosyn in ED) - Follow up results of urine and blood cx and amend antibiotic regimen if needed per sensitivity results - prn Zofran for nausea - will get Procalcitonin and trend lactic acid levels per sepsis protocol. - IVF: 1L NS, then 125 cc/h  - f/u Bx and Ux  Acute metabolic encephalopathy: pt has worsening mental status than the baseline, most likely due to UTI and sepsis -Frequent neuro check -treat UTI and sepsis as above -f/u CT-head  HLD: -Lipitor  Alzheimer's dementia without behavioral disturbance, moderate severe, 05/2016 12/30 -continue galantamine  HTN:  -Continue home medications: Lisinopril and atenolol -IV hydralazine prn  Diet controlled type 2 diabetes mellitus with diabetic neuropathy ([place]): Last A1c 7.2 on 10/14/16. Patient is not taking meds at home. Blood sugar is -Check CBG qAM  Mild Hyperkalemia: K=5.2. -Kayexalate 15 g 1   DVT ppx: SQ Lovenox Code Status: Full code Family Communication:   Yes, patient's husband and daughter-in-law  at bed side Disposition Plan:  Anticipate discharge back to previous home environment Consults called:  none Admission status: Obs / tele     Date of Service 03/12/2017    Ivor Costa [facility] Pager [phone]  If 7PM-7AM, please contact night-coverage [email or web address] Password [email or web address] 03/12/2017, 12:42 AM

## 2017-03-12 NOTE — Progress Notes (Signed)
Pt not able to swallow medicine, crushed meds and gave in apple sauce.

## 2017-03-12 NOTE — ED Notes (Signed)
Pt declined lunch at this time.  Per husband he has to persuade pt to eat.  Per husband no lunch tray at this time, pt will wait until supper for tray.  Diet coke given to patient.

## 2017-03-12 NOTE — ED Notes (Signed)
Nurse will draw morning labs. 

## 2017-03-12 NOTE — ED Notes (Signed)
Paged [name] Mccreedy, MD for diet orders.  Orders obtained, verbal order entered for Heart Healthy diet.

## 2017-03-13 ENCOUNTER — Encounter (HOSPITAL_COMMUNITY): Payer: Self-pay | Admitting: General Practice

## 2017-03-13 LAB — BASIC METABOLIC PANEL
Anion gap: 10 (ref 5–15)
BUN: 16 mg/dL (ref 6–20)
CHLORIDE: 105 mmol/L (ref 101–111)
CO2: 23 mmol/L (ref 22–32)
Calcium: 8.3 mg/dL — ABNORMAL LOW (ref 8.9–10.3)
Creatinine, Ser: 0.8 mg/dL (ref 0.44–1.00)
GFR calc Af Amer: >60 mL/min (ref >60)
GFR calc non Af Amer: >60 mL/min (ref >60)
Glucose, Bld: 114 mg/dL — ABNORMAL HIGH (ref 65–99)
POTASSIUM: 3.7 mmol/L (ref 3.5–5.1)
Sodium: 138 mmol/L (ref 135–145)

## 2017-03-13 LAB — CBC WITH DIFFERENTIAL/PLATELET
Basophils Absolute: 0 K/uL (ref 0.0–0.1)
Basophils Relative: 0 %
Eosinophils Absolute: 0.2 K/uL (ref 0.0–0.7)
Eosinophils Relative: 2 %
HCT: 38.1 % (ref 36.0–46.0)
Hemoglobin: 12.3 g/dL (ref 12.0–15.0)
Lymphocytes Relative: 15 %
Lymphs Abs: 1.6 K/uL (ref 0.7–4.0)
MCH: 30.8 pg (ref 26.0–34.0)
MCHC: 32.3 g/dL (ref 30.0–36.0)
MCV: 95.5 fL (ref 78.0–100.0)
Monocytes Absolute: 1 K/uL (ref 0.1–1.0)
Monocytes Relative: 10 %
Neutro Abs: 7.7 K/uL (ref 1.7–7.7)
Neutrophils Relative %: 73 %
Platelets: 224 K/uL (ref 150–400)
RBC: 3.99 MIL/uL (ref 3.87–5.11)
RDW: 13.9 % (ref 11.5–15.5)
WBC: 10.5 K/uL (ref 4.0–10.5)

## 2017-03-13 LAB — URINE CULTURE

## 2017-03-13 LAB — GLUCOSE, CAPILLARY: GLUCOSE-CAPILLARY: 101 mg/dL — AB (ref 65–99)

## 2017-03-13 NOTE — Progress Notes (Signed)
PROGRESS NOTE  [name] [name] YNW:[id number] DOB: [birth year] DOA: 03/11/2017 PCP: Jinny Sanders, MD  HPI/Recap of past 24 hours:  [name] [name] is a 84 y.o. female with medical history significant of hypertension, hyperlipidemia, diet-controlled diabetes, Alzheimer disease, breast cancer (left lumpectomy), who presents with altered mental status and generalized weakness. U/A positive for pyuria. Started on rocephin in the ED. Cultures drawn and are in process.  03/13/17: Patient seen and examined with no family members at her bedside. She is alert but very confused. Unable to obtain a ROS due to altered mental status.  Assessment/Plan: Principal Problem:   UTI (urinary tract infection) Active Problems:   HYPERCHOLESTEROLEMIA   Alzheimer's dementia without behavioral disturbance, moderate severe, 05/2016 12/30   Controlled type 2 diabetes mellitus with diabetic neuropathy ([facility])   Acute metabolic encephalopathy   Hyperkalemia   Sepsis ([place])  UTI and sepsis:  - Continue ceftriaxone - prn Zofran for nausea - Continue IVF - blood cultures and urine cx in process  Acute metabolic encephalopathy:  -possible due to sepsis -Frequent neuro check -treat UTI and sepsis as above -CT-head 03/12/17: Volume loss and chronic microvascular disease without acute intracranial abnormality  HLD: -Lipitor  Alzheimer's dementia without behavioral disturbance -moderate severe, 05/2016 12/30 -continue galantamine  HTN:  -BP stable -Continue home medications: Lisinopril and atenolol -IV hydralazine prn  Diet controlled type 2 diabetes mellitus with diabetic neuropathy ([place]):  -Last A1c 7.2 on 10/14/16 -CBG qAM  Mild Hyperkalemia: K=5.2. -Kayexalate 15 g 1 -BMP am   Code Status: Full  Family Communication: none at her bedside  Disposition Plan: Will stay another midnight to continue IV antibiotic   Consultants:  none  Procedures:  none   Antimicrobials:  IV  rocephin  DVT prophylaxis:  sq lovenox    Objective: Vitals:   03/12/17 1634 03/12/17 2209 03/12/17 2209 03/13/17 0633  BP: (!) 139/56 (!) 141/57 (!) 141/57 136/79  Pulse: 75 76 76 85  Resp: 20 (!) 21 (!) 21 20  Temp: 97.7 F (36.5 C) 98.6 F (37 C) 98.6 F (37 C) 98.2 F (36.8 C)  TempSrc: Oral Oral  Oral  SpO2: 98% 95% 95% 98%  Weight:      Height:        Intake/Output Summary (Last 24 hours) at 03/13/2017 6578 Last data filed at 03/13/2017 0700 Gross per 24 hour  Intake 2508.33 ml  Output -  Net 2508.33 ml   Filed Weights   03/11/17 2130  Weight: 97.1 kg (214 lb)    Exam:   General:  84 yo CF wd wn nad alert but very confused  Cardiovascular: RRR no rubs or gallops   Respiratory: CTA no rales or wheezes  Abdomen: soft Nt nd nbs x4  Musculoskeletal: does not appear focal no LE edema  Skin: no rash noted  Psychiatry: unable to assess due to altered mental status    Data Reviewed: CBC: Recent Labs  Lab 03/11/17 2132 03/12/17 0557 03/13/17 0149  WBC 13.2* 12.0* 10.5  NEUTROABS 10.8*  --  7.7  HGB 14.5 12.0 12.3  HCT 43.5 37.3 38.1  MCV 95.2 95.2 95.5  PLT 240 218 469   Basic Metabolic Panel: Recent Labs  Lab 03/11/17 2132 03/12/17 0117 03/12/17 0557 03/13/17 0149  NA 134* 134* 134* 138  K 5.2* 3.9 3.7 3.7  CL 100* 101 102 105  CO2 21* 21* 23 23  GLUCOSE 167* 170* 146* 114*  BUN 18 18 18 16   CREATININE  0.88 0.93 0.86 0.80  CALCIUM 9.0 8.4* 8.2* 8.3*   GFR: Estimated Creatinine Clearance: 66 mL/min (by C-G formula based on SCr of 0.8 mg/dL). Liver Function Tests: Recent Labs  Lab 03/11/17 2132 03/12/17 0117  AST 38 19  ALT 18 16  ALKPHOS 97 83  BILITOT 2.1* 0.8  PROT 7.2 6.3*  ALBUMIN 3.5 3.2*   No results for input(s): LIPASE, AMYLASE in the last 168 hours. No results for input(s): AMMONIA in the last 168 hours. Coagulation Profile: Recent Labs  Lab 03/11/17 2132  INR 0.97   Cardiac Enzymes: No results for input(s):  CKTOTAL, CKMB, CKMBINDEX, TROPONINI in the last 168 hours. BNP (last 3 results) No results for input(s): PROBNP in the last 8760 hours. HbA1C: No results for input(s): HGBA1C in the last 72 hours. CBG: Recent Labs  Lab 03/12/17 0921 03/13/17 0744  GLUCAP 107* 101*   Lipid Profile: No results for input(s): CHOL, HDL, LDLCALC, TRIG, CHOLHDL, LDLDIRECT in the last 72 hours. Thyroid Function Tests: No results for input(s): TSH, T4TOTAL, FREET4, T3FREE, THYROIDAB in the last 72 hours. Anemia Panel: No results for input(s): VITAMINB12, FOLATE, FERRITIN, TIBC, IRON, RETICCTPCT in the last 72 hours. Urine analysis:    Component Value Date/Time   COLORURINE YELLOW 03/11/2017 2300   APPEARANCEUR TURBID (A) 03/11/2017 2300   LABSPEC 1.020 03/11/2017 2300   PHURINE 5.0 03/11/2017 2300   GLUCOSEU NEGATIVE 03/11/2017 2300   HGBUR SMALL (A) 03/11/2017 2300   BILIRUBINUR NEGATIVE 03/11/2017 2300   BILIRUBINUR negative 01/30/2014 1455   KETONESUR NEGATIVE 03/11/2017 2300   PROTEINUR 100 (A) 03/11/2017 2300   UROBILINOGEN 0.2 01/30/2014 1455   UROBILINOGEN 0.2 01/09/2007 2350   NITRITE POSITIVE (A) 03/11/2017 2300   LEUKOCYTESUR LARGE (A) 03/11/2017 2300   Sepsis Labs: @LABRCNTIP (procalcitonin:4,lacticidven:4)  ) Recent Results (from the past 240 hour(s))  Blood Culture (routine x 2)     Status: None (Preliminary result)   Collection Time: 03/11/17  9:33 PM  Result Value Ref Range Status   Specimen Description BLOOD LEFT FOREARM  Final   Special Requests   Final    BOTTLES DRAWN AEROBIC AND ANAEROBIC Blood Culture adequate volume   Culture NO GROWTH < 24 HOURS  Final   Report Status PENDING  Incomplete  Blood Culture (routine x 2)     Status: None (Preliminary result)   Collection Time: 03/11/17 10:07 PM  Result Value Ref Range Status   Specimen Description BLOOD RIGHT FOREARM  Final   Special Requests IN PEDIATRIC BOTTLE Blood Culture adequate volume  Final   Culture NO GROWTH <  24 HOURS  Final   Report Status PENDING  Incomplete  Urine culture     Status: Abnormal   Collection Time: 03/11/17 11:08 PM  Result Value Ref Range Status   Specimen Description URINE, CATHETERIZED  Final   Special Requests NONE  Final   Culture MULTIPLE SPECIES PRESENT, SUGGEST RECOLLECTION (A)  Final   Report Status 03/13/2017 FINAL  Final      Studies: No results found.  Scheduled Meds: . atenolol  25 mg Oral Daily  . atorvastatin  80 mg Oral q1800  . calcium-vitamin D  1 tablet Oral BID WC  . diclofenac  75 mg Oral BID  . enoxaparin (LOVENOX) injection  40 mg Subcutaneous Q24H  . galantamine  12 mg Oral BID  . lisinopril  20 mg Oral Daily  . thiamine  100 mg Oral Daily    Continuous Infusions: . sodium chloride 1,000  mL (03/13/17 0700)  . cefTRIAXone (ROCEPHIN)  IV 1 g (03/13/17 0555)     LOS: 1 day     Kayleen Memos, MD [facility] Pager [phone]  If 7PM-7AM, please contact night-coverage [email or web address] Password [email or web address] 03/13/2017, 9:22 AM

## 2017-03-14 DIAGNOSIS — R262 Difficulty in walking, not elsewhere classified: Secondary | ICD-10-CM

## 2017-03-14 LAB — BASIC METABOLIC PANEL
ANION GAP: 9 (ref 5–15)
BUN: 14 mg/dL (ref 6–20)
CALCIUM: 8.5 mg/dL — AB (ref 8.9–10.3)
CHLORIDE: 108 mmol/L (ref 101–111)
CO2: 23 mmol/L (ref 22–32)
Creatinine, Ser: 0.74 mg/dL (ref 0.44–1.00)
GFR calc Af Amer: >60 mL/min (ref >60)
GFR calc non Af Amer: >60 mL/min (ref >60)
GLUCOSE: 107 mg/dL — AB (ref 65–99)
Potassium: 3.5 mmol/L (ref 3.5–5.1)
Sodium: 140 mmol/L (ref 135–145)

## 2017-03-14 LAB — GLUCOSE, CAPILLARY: Glucose-Capillary: 108 mg/dL — ABNORMAL HIGH (ref 65–99)

## 2017-03-14 MED ORDER — HALOPERIDOL LACTATE 5 MG/ML IJ SOLN
2.5000 mg | Freq: Once | INTRAMUSCULAR | Status: AC
  Administered 2017-03-14: 2.5 mg via INTRAVENOUS
  Filled 2017-03-14: qty 1

## 2017-03-14 MED ORDER — HALOPERIDOL LACTATE 5 MG/ML IJ SOLN
INTRAMUSCULAR | Status: AC
  Administered 2017-03-14: 5 mg
  Filled 2017-03-14: qty 1

## 2017-03-14 NOTE — Progress Notes (Signed)
PROGRESS NOTE  [name] [name] KCL:[id number] DOB: Feb 03, 1934 DOA: 03/11/2017 PCP: Jinny Sanders, MD  HPI/Recap of past 24 hours:  [name] [name] is a 84 y.o. female with medical history significant of hypertension, hyperlipidemia, diet-controlled diabetes, Alzheimer disease, breast cancer (left lumpectomy), who presents with altered mental status and generalized weakness. U/A positive for pyuria. Started on rocephin in the ED. Cultures drawn and are in process.  03/14/17: Patient seen and examined with no family members at her bedside. She is alert but very confused. Unable to obtain a ROS due to altered mental status.  Assessment/Plan: Principal Problem:   UTI (urinary tract infection) Active Problems:   HYPERCHOLESTEROLEMIA   Alzheimer's dementia without behavioral disturbance, moderate severe, 05/2016 12/30   Controlled type 2 diabetes mellitus with diabetic neuropathy (HCC)   Acute metabolic encephalopathy   Hyperkalemia   Sepsis ([place])  UTI and sepsis:  - Continue ceftriaxone - prn Zofran for nausea - Continue IVF - blood cultures and urine cx no growth to date  Acute metabolic encephalopathy, persistent:  -possible due to sepsis -Frequent neuro check -treat UTI and sepsis as above -CT-head 03/12/17: Volume loss and chronic microvascular disease without acute intracranial abnormality  HLD: -Lipitor  Alzheimer's dementia without behavioral disturbance -moderate severe, 05/2016 12/30 -continue galantamine  HTN:  -BP stable -Continue home medications: Lisinopril and atenolol -IV hydralazine prn  Diet controlled type 2 diabetes mellitus with diabetic neuropathy ([place]):  -Last A1c 7.2 on 10/14/16 -CBG qAM  Hyperkalemia, resolved:  -K+3.5 from 5.2. -03/13/17 Kayexalate 15 g 1 -BMP am   Code Status: Full  Family Communication: none at her bedside  Disposition Plan: Will stay another midnight to continue IV  antibiotic   Consultants:  none  Procedures:  none   Antimicrobials:  IV rocephin  DVT prophylaxis:  sq lovenox    Objective: Vitals:   03/13/17 2143 03/14/17 0630 03/14/17 0933 03/14/17 1442  BP: (!) 146/77 (!) 154/113 109/70 118/82  Pulse: 77 83 78 76  Resp: 19 (!) 22  19  Temp: 98.3 F (36.8 C) 97.6 F (36.4 C)  98 F (36.7 C)  TempSrc: Oral Oral  Oral  SpO2: 100% 99%  98%  Weight:      Height:        Intake/Output Summary (Last 24 hours) at 03/14/2017 1756 Last data filed at 03/14/2017 1440 Gross per 24 hour  Intake 120 ml  Output 1200 ml  Net -1080 ml   Filed Weights   03/11/17 2130  Weight: 97.1 kg (214 lb)    Exam: 03/14/17 patient seen and examined   General:  84 yo CF wd wn nad alert but very confused  Cardiovascular: RRR no rubs or gallops   Respiratory: CTA no rales or wheezes  Abdomen: soft Nt nd nbs x4  Musculoskeletal: does not appear focal no LE edema  Skin: no rash noted  Psychiatry: unable to assess due to altered mental status    Data Reviewed: CBC: Recent Labs  Lab 03/11/17 2132 03/12/17 0557 03/13/17 0149  WBC 13.2* 12.0* 10.5  NEUTROABS 10.8*  --  7.7  HGB 14.5 12.0 12.3  HCT 43.5 37.3 38.1  MCV 95.2 95.2 95.5  PLT 240 218 494   Basic Metabolic Panel: Recent Labs  Lab 03/11/17 2132 03/12/17 0117 03/12/17 0557 03/13/17 0149 03/14/17 0230  NA 134* 134* 134* 138 140  K 5.2* 3.9 3.7 3.7 3.5  CL 100* 101 102 105 108  CO2 21* 21* 23 23 23  GLUCOSE 167* 170* 146* 114* 107*  BUN 18 18 18 16 14   CREATININE 0.88 0.93 0.86 0.80 0.74  CALCIUM 9.0 8.4* 8.2* 8.3* 8.5*   GFR: Estimated Creatinine Clearance: 66 mL/min (by C-G formula based on SCr of 0.74 mg/dL). Liver Function Tests: Recent Labs  Lab 03/11/17 2132 03/12/17 0117  AST 38 19  ALT 18 16  ALKPHOS 97 83  BILITOT 2.1* 0.8  PROT 7.2 6.3*  ALBUMIN 3.5 3.2*   No results for input(s): LIPASE, AMYLASE in the last 168 hours. No results for input(s):  AMMONIA in the last 168 hours. Coagulation Profile: Recent Labs  Lab 03/11/17 2132  INR 0.97   Cardiac Enzymes: No results for input(s): CKTOTAL, CKMB, CKMBINDEX, TROPONINI in the last 168 hours. BNP (last 3 results) No results for input(s): PROBNP in the last 8760 hours. HbA1C: No results for input(s): HGBA1C in the last 72 hours. CBG: Recent Labs  Lab 03/12/17 0921 03/13/17 0744 03/14/17 0800  GLUCAP 107* 101* 108*   Lipid Profile: No results for input(s): CHOL, HDL, LDLCALC, TRIG, CHOLHDL, LDLDIRECT in the last 72 hours. Thyroid Function Tests: No results for input(s): TSH, T4TOTAL, FREET4, T3FREE, THYROIDAB in the last 72 hours. Anemia Panel: No results for input(s): VITAMINB12, FOLATE, FERRITIN, TIBC, IRON, RETICCTPCT in the last 72 hours. Urine analysis:    Component Value Date/Time   COLORURINE YELLOW 03/11/2017 2300   APPEARANCEUR TURBID (A) 03/11/2017 2300   LABSPEC 1.020 03/11/2017 2300   PHURINE 5.0 03/11/2017 2300   GLUCOSEU NEGATIVE 03/11/2017 2300   HGBUR SMALL (A) 03/11/2017 2300   BILIRUBINUR NEGATIVE 03/11/2017 2300   BILIRUBINUR negative 01/30/2014 1455   KETONESUR NEGATIVE 03/11/2017 2300   PROTEINUR 100 (A) 03/11/2017 2300   UROBILINOGEN 0.2 01/30/2014 1455   UROBILINOGEN 0.2 01/09/2007 2350   NITRITE POSITIVE (A) 03/11/2017 2300   LEUKOCYTESUR LARGE (A) 03/11/2017 2300   Sepsis Labs: @LABRCNTIP (procalcitonin:4,lacticidven:4)  ) Recent Results (from the past 240 hour(s))  Blood Culture (routine x 2)     Status: None (Preliminary result)   Collection Time: 03/11/17  9:33 PM  Result Value Ref Range Status   Specimen Description BLOOD LEFT FOREARM  Final   Special Requests   Final    BOTTLES DRAWN AEROBIC AND ANAEROBIC Blood Culture adequate volume   Culture NO GROWTH 3 DAYS  Final   Report Status PENDING  Incomplete  Blood Culture (routine x 2)     Status: None (Preliminary result)   Collection Time: 03/11/17 10:07 PM  Result Value Ref  Range Status   Specimen Description BLOOD RIGHT FOREARM  Final   Special Requests IN PEDIATRIC BOTTLE Blood Culture adequate volume  Final   Culture NO GROWTH 3 DAYS  Final   Report Status PENDING  Incomplete  Urine culture     Status: Abnormal   Collection Time: 03/11/17 11:08 PM  Result Value Ref Range Status   Specimen Description URINE, CATHETERIZED  Final   Special Requests NONE  Final   Culture MULTIPLE SPECIES PRESENT, SUGGEST RECOLLECTION (A)  Final   Report Status 03/13/2017 FINAL  Final      Studies: No results found.  Scheduled Meds: . atenolol  25 mg Oral Daily  . atorvastatin  80 mg Oral q1800  . calcium-vitamin D  1 tablet Oral BID WC  . diclofenac  75 mg Oral BID  . enoxaparin (LOVENOX) injection  40 mg Subcutaneous Q24H  . galantamine  12 mg Oral BID  . lisinopril  20 mg Oral  Daily  . thiamine  100 mg Oral Daily    Continuous Infusions: . sodium chloride 1,000 mL (03/14/17 0432)  . cefTRIAXone (ROCEPHIN)  IV 1 g (03/14/17 0510)     LOS: 2 days     Kayleen Memos, MD [facility] Pager [phone]  If 7PM-7AM, please contact night-coverage [email or web address] Password [facility] 03/14/2017, 5:56 PM

## 2017-03-14 NOTE — Progress Notes (Addendum)
Nutrition Brief Note  Patient identified on the Malnutrition Screening Tool (MST) Report  Wt Readings from Last 15 Encounters:  03/11/17 214 lb (97.1 kg)  10/14/16 214 lb 12 oz (97.4 kg)  10/14/16 214 lb 12 oz (97.4 kg)  05/17/16 215 lb 4 oz (97.6 kg)  03/28/16 208 lb 4 oz (94.5 kg)  12/25/15 207 lb 8 oz (94.1 kg)  09/24/15 208 lb 8 oz (94.6 kg)  09/24/15 208 lb 8 oz (94.6 kg)  04/23/15 205 lb 12 oz (93.3 kg)  01/07/15 205 lb (93 kg)  11/24/14 204 lb (92.5 kg)  10/28/14 203 lb (92.1 kg)  10/03/14 205 lb (93 kg)  06/27/14 202 lb 4 oz (91.7 kg)  03/21/14 192 lb 8 oz (87.3 kg)    Spoke with patient and family at bedside. They state patient was eating well PTA 2 meals a day with snacks. All meals prepared for her. No weight loss. No apparent needs.  Body mass index is 33.52 kg/m. Patient meets criteria for obese class I based on current BMI.   Current diet order is heart healthy, patient is consuming approximately 100% of meals at this time. Labs and medications reviewed.   No nutrition interventions warranted at this time. If nutrition issues arise, please consult RD.   [name] [name]. [name] Bauers, MS, RD LDN Inpatient Clinical Dietitian Pager [phone]

## 2017-03-15 DIAGNOSIS — R339 Retention of urine, unspecified: Secondary | ICD-10-CM | POA: Diagnosis not present

## 2017-03-15 DIAGNOSIS — E785 Hyperlipidemia, unspecified: Secondary | ICD-10-CM | POA: Diagnosis not present

## 2017-03-15 DIAGNOSIS — M6281 Muscle weakness (generalized): Secondary | ICD-10-CM | POA: Diagnosis not present

## 2017-03-15 DIAGNOSIS — F0280 Dementia in other diseases classified elsewhere without behavioral disturbance: Secondary | ICD-10-CM | POA: Diagnosis not present

## 2017-03-15 DIAGNOSIS — N390 Urinary tract infection, site not specified: Secondary | ICD-10-CM

## 2017-03-15 DIAGNOSIS — R2689 Other abnormalities of gait and mobility: Secondary | ICD-10-CM | POA: Diagnosis not present

## 2017-03-15 DIAGNOSIS — E119 Type 2 diabetes mellitus without complications: Secondary | ICD-10-CM | POA: Diagnosis not present

## 2017-03-15 DIAGNOSIS — E875 Hyperkalemia: Secondary | ICD-10-CM

## 2017-03-15 DIAGNOSIS — R41841 Cognitive communication deficit: Secondary | ICD-10-CM | POA: Diagnosis not present

## 2017-03-15 DIAGNOSIS — F0390 Unspecified dementia without behavioral disturbance: Secondary | ICD-10-CM | POA: Diagnosis not present

## 2017-03-15 DIAGNOSIS — A419 Sepsis, unspecified organism: Secondary | ICD-10-CM | POA: Diagnosis not present

## 2017-03-15 DIAGNOSIS — G309 Alzheimer's disease, unspecified: Secondary | ICD-10-CM

## 2017-03-15 DIAGNOSIS — R319 Hematuria, unspecified: Secondary | ICD-10-CM | POA: Diagnosis not present

## 2017-03-15 DIAGNOSIS — C50919 Malignant neoplasm of unspecified site of unspecified female breast: Secondary | ICD-10-CM | POA: Diagnosis not present

## 2017-03-15 DIAGNOSIS — I10 Essential (primary) hypertension: Secondary | ICD-10-CM | POA: Diagnosis not present

## 2017-03-15 DIAGNOSIS — R4182 Altered mental status, unspecified: Secondary | ICD-10-CM | POA: Diagnosis not present

## 2017-03-15 DIAGNOSIS — G9341 Metabolic encephalopathy: Secondary | ICD-10-CM | POA: Diagnosis not present

## 2017-03-15 DIAGNOSIS — E1140 Type 2 diabetes mellitus with diabetic neuropathy, unspecified: Secondary | ICD-10-CM | POA: Diagnosis not present

## 2017-03-15 DIAGNOSIS — R278 Other lack of coordination: Secondary | ICD-10-CM | POA: Diagnosis not present

## 2017-03-15 DIAGNOSIS — R6520 Severe sepsis without septic shock: Secondary | ICD-10-CM | POA: Diagnosis not present

## 2017-03-15 DIAGNOSIS — R531 Weakness: Secondary | ICD-10-CM | POA: Diagnosis not present

## 2017-03-15 LAB — GLUCOSE, CAPILLARY: Glucose-Capillary: 108 mg/dL — ABNORMAL HIGH (ref 65–99)

## 2017-03-15 MED ORDER — CEFUROXIME AXETIL 250 MG PO TABS: 250.0000 mg | ORAL_TABLET | Freq: Two times a day (BID) | ORAL | 0 refills | Status: AC

## 2017-03-15 MED ORDER — CEFUROXIME AXETIL 250 MG PO TABS
250.0000 mg | ORAL_TABLET | Freq: Two times a day (BID) | ORAL | Status: DC
  Administered 2017-03-15: 250 mg via ORAL
  Filled 2017-03-15 (×2): qty 1

## 2017-03-15 NOTE — Clinical Social Work Note (Signed)
Clinical Social Worker facilitated patient discharge including contacting patient family and facility to confirm patient discharge plans.  Clinical information faxed to facility and family agreeable with plan.  CSW arranged ambulance transport via [facility] to [facility] .  RN to call report prior to discharge.  Clinical Social Worker will sign off for now as social work intervention is no longer needed. Please consult us again if new need arises.  Jesse Jadelin Eng, LCSW [phone] 

## 2017-03-15 NOTE — Clinical Social Work Placement (Addendum)
   CLINICAL SOCIAL WORK PLACEMENT  NOTE  Date:  03/15/2017  Patient Details  Name: [name] MRN: [id number] Date of Birth: [birth year]  Clinical Social Work is seeking post-discharge placement for this patient at the [place] level of care (*CSW will initial, date and re-position this form in  chart as items are completed):  Yes   Patient/family provided with [place] Work Department's list of facilities offering this level of care within the geographic area requested by the patient (or if unable, by the patient's family).  Yes   Patient/family informed of their freedom to choose among providers that offer the needed level of care, that participate in Medicare, Medicaid or managed care program needed by the patient, have an available bed and are willing to accept the patient.  Yes   Patient/family informed of [facility]'s ownership interest in [facility] and [facility], as well as of the fact that they are under no obligation to receive care at these facilities.  PASRR submitted to EDS on 03/15/17     PASRR number received on 03/15/17     Existing PASRR number confirmed on       FL2 transmitted to all facilities in geographic area requested by pt/family on 03/15/17     FL2 transmitted to all facilities within larger geographic area on       Patient informed that his/her managed care company has contracts with or will negotiate with certain facilities, including the following:        Yes   Patient/family informed of bed offers received.  Patient chooses bed at [facility]     Physician recommends and patient chooses bed at      Patient to be transferred to [facility] on 03/15/17.  Patient to be transferred to facility by [facility]     Patient family notified on 03/15/17 of transfer.  Name of family member notified:  Chrisma Hurlock - daughter in law    PHYSICIAN Please sign FL2, Please prepare  priority discharge summary, including medications     Additional Comment:   Barbette Or, [place]

## 2017-03-15 NOTE — Progress Notes (Signed)
Patient ID: [name] [name], female   DOB: 02-Aug-1933, 84 y.o.   MRN: [id number]  PROGRESS NOTE    [name] [name]  GGE:[id number] DOB: [birth year] DOA: 03/11/2017 PCP: Jinny Sanders, MD   Brief Narrative:  84 year old female with history of hypertension, hyperlipidemia, diet-controlled diabetes, Alzheimer's disease, breast cancer status post left lumpectomy presented with altered mental status and generalized weakness.  She was admitted with UTI on intravenous antibiotics.  Assessment & Plan:   Principal Problem:   UTI (urinary tract infection) Active Problems:   HYPERCHOLESTEROLEMIA   Alzheimer's dementia without behavioral disturbance, moderate severe, 05/2016 12/30   Controlled type 2 diabetes mellitus with diabetic neuropathy (HCC)   Acute metabolic encephalopathy   Hyperkalemia   Sepsis ([place])    UTIand sepsis: -Cultures negative so far.  Hemodynamically improving.  Currently on Rocephin.  Switch to Ceftin   Acute metabolic encephalopathy: -possibly due to sepsis -Mental status has much improved and is almost at baseline as per the family members. -CT-head 03/12/17: Volume loss and chronic microvascular disease without acute intracranial abnormality  Leukocytosis -Resolved  Hyperlipidemia: -Continue Lipitor  Alzheimer's dementia without behavioral disturbance -continuegalantamine -Fall precautions -Monitor mental status  HTN:  -BP stable -Continue Lisinopril and atenolol  Diet controlledtype 2 diabetes mellitus with diabetic neuropathy: -Last A1c7.2 on 10/14/16 -Monitor Accu-Cheks  Hyperkalemia -Resolved  Generalized deconditioning -PT eval   DVT prophylaxis: Lovenox Code Status: Full Family Communication: Discussed with husband and granddaughter at bedside Disposition Plan: Discharge to nursing home once bed is available  Consultants: None  Procedures: None  Antimicrobials:  Anti-infectives (From admission, onward)   Start     Dose/Rate  Route Frequency Ordered Stop   03/15/17 1000  cefUROXime (CEFTIN) tablet 250 mg     250 mg Oral 2 times daily with meals 03/15/17 0932 03/19/17 0759   03/12/17 1000  vancomycin (VANCOCIN) IVPB 1000 mg/200 mL premix  Status:  Discontinued     1,000 mg 200 mL/hr over 60 Minutes Intravenous Every 12 hours 03/11/17 2301 03/12/17 0030   03/12/17 0600  cefTRIAXone (ROCEPHIN) 1 g in dextrose 5 % 50 mL IVPB  Status:  Discontinued     1 g 100 mL/hr over 30 Minutes Intravenous Every 24 hours 03/12/17 0032 03/15/17 0932   03/11/17 2215  vancomycin (VANCOCIN) IVPB 1000 mg/200 mL premix     1,000 mg 200 mL/hr over 60 Minutes Intravenous  Once 03/11/17 2208 03/11/17 2329   03/11/17 2200  piperacillin-tazobactam (ZOSYN) IVPB 3.375 g     3.375 g 100 mL/hr over 30 Minutes Intravenous  Once 03/11/17 2150 03/11/17 2259   03/11/17 2200  vancomycin (VANCOCIN) IVPB 1000 mg/200 mL premix     1,000 mg 200 mL/hr over 60 Minutes Intravenous  Once 03/11/17 2150 03/11/17 2328        Subjective: Patient seen and examined at bedside.  She is awake but still slightly confused.  No overnight fever or vomiting.  Spoke to family members at bedside.  Family thinks that patient is at baseline mental status  Objective: Vitals:   03/14/17 1442 03/14/17 2317 03/15/17 0501 03/15/17 0858  BP: 118/82 (!) 179/86 135/81 (!) 161/77  Pulse: 76 69 80 83  Resp: 19 (!) 24 19 18   Temp: 98 F (36.7 C) 99.3 F (37.4 C) 98.2 F (36.8 C)   TempSrc: Oral     SpO2: 98% 93% 99%   Weight:      Height:        Intake/Output Summary (Last  24 hours) at 03/15/2017 1247 Last data filed at 03/14/2017 1440 Gross per 24 hour  Intake 120 ml  Output -  Net 120 ml   Filed Weights   03/11/17 2130  Weight: 97.1 kg (214 lb)    Examination:  General exam: Appears calm and comfortable but still slightly confused Respiratory system: Bilateral decreased breath sound at bases Cardiovascular system: S1 & S2 heard, rate controlled    gastrointestinal system: Abdomen is nondistended, soft and nontender. Normal bowel sounds heard. Extremities: No cyanosis, clubbing, edema    Data Reviewed: I have personally reviewed following labs and imaging studies  CBC: Recent Labs  Lab 03/11/17 2132 03/12/17 0557 03/13/17 0149  WBC 13.2* 12.0* 10.5  NEUTROABS 10.8*  --  7.7  HGB 14.5 12.0 12.3  HCT 43.5 37.3 38.1  MCV 95.2 95.2 95.5  PLT 240 218 229   Basic Metabolic Panel: Recent Labs  Lab 03/11/17 2132 03/12/17 0117 03/12/17 0557 03/13/17 0149 03/14/17 0230  NA 134* 134* 134* 138 140  K 5.2* 3.9 3.7 3.7 3.5  CL 100* 101 102 105 108  CO2 21* 21* 23 23 23   GLUCOSE 167* 170* 146* 114* 107*  BUN 18 18 18 16 14   CREATININE 0.88 0.93 0.86 0.80 0.74  CALCIUM 9.0 8.4* 8.2* 8.3* 8.5*   GFR: Estimated Creatinine Clearance: 66 mL/min (by C-G formula based on SCr of 0.74 mg/dL). Liver Function Tests: Recent Labs  Lab 03/11/17 2132 03/12/17 0117  AST 38 19  ALT 18 16  ALKPHOS 97 83  BILITOT 2.1* 0.8  PROT 7.2 6.3*  ALBUMIN 3.5 3.2*   No results for input(s): LIPASE, AMYLASE in the last 168 hours. No results for input(s): AMMONIA in the last 168 hours. Coagulation Profile: Recent Labs  Lab 03/11/17 2132  INR 0.97   Cardiac Enzymes: No results for input(s): CKTOTAL, CKMB, CKMBINDEX, TROPONINI in the last 168 hours. BNP (last 3 results) No results for input(s): PROBNP in the last 8760 hours. HbA1C: No results for input(s): HGBA1C in the last 72 hours. CBG: Recent Labs  Lab 03/12/17 0921 03/13/17 0744 03/14/17 0800 03/15/17 0818  GLUCAP 107* 101* 108* 108*   Lipid Profile: No results for input(s): CHOL, HDL, LDLCALC, TRIG, CHOLHDL, LDLDIRECT in the last 72 hours. Thyroid Function Tests: No results for input(s): TSH, T4TOTAL, FREET4, T3FREE, THYROIDAB in the last 72 hours. Anemia Panel: No results for input(s): VITAMINB12, FOLATE, FERRITIN, TIBC, IRON, RETICCTPCT in the last 72 hours. Sepsis  Labs: Recent Labs  Lab 03/11/17 2150 03/12/17 0117 03/12/17 0155  PROCALCITON  --  0.37  --   LATICACIDVEN 1.58  --  2.11*    Recent Results (from the past 240 hour(s))  Blood Culture (routine x 2)     Status: None (Preliminary result)   Collection Time: 03/11/17  9:33 PM  Result Value Ref Range Status   Specimen Description BLOOD LEFT FOREARM  Final   Special Requests   Final    BOTTLES DRAWN AEROBIC AND ANAEROBIC Blood Culture adequate volume   Culture NO GROWTH 3 DAYS  Final   Report Status PENDING  Incomplete  Blood Culture (routine x 2)     Status: None (Preliminary result)   Collection Time: 03/11/17 10:07 PM  Result Value Ref Range Status   Specimen Description BLOOD RIGHT FOREARM  Final   Special Requests IN PEDIATRIC BOTTLE Blood Culture adequate volume  Final   Culture NO GROWTH 3 DAYS  Final   Report Status PENDING  Incomplete  Urine culture     Status: Abnormal   Collection Time: 03/11/17 11:08 PM  Result Value Ref Range Status   Specimen Description URINE, CATHETERIZED  Final   Special Requests NONE  Final   Culture MULTIPLE SPECIES PRESENT, SUGGEST RECOLLECTION (A)  Final   Report Status 03/13/2017 FINAL  Final         Radiology Studies: No results found.      Scheduled Meds: . atenolol  25 mg Oral Daily  . atorvastatin  80 mg Oral q1800  . calcium-vitamin D  1 tablet Oral BID WC  . cefUROXime  250 mg Oral BID WC  . diclofenac  75 mg Oral BID  . enoxaparin (LOVENOX) injection  40 mg Subcutaneous Q24H  . galantamine  12 mg Oral BID  . lisinopril  20 mg Oral Daily  . thiamine  100 mg Oral Daily   Continuous Infusions: . sodium chloride 1,000 mL (03/14/17 0432)     LOS: 3 days        Aline August, MD [facility] Pager [phone]  If 7PM-7AM, please contact night-coverage [email or web address] Password [facility] 03/15/2017, 12:47 PM

## 2017-03-15 NOTE — Consult Note (Signed)
[facility] CM Primary Care Navigator  03/15/2017  [name] [name] 1934-01-12 [id number]   Met with patient at the bedside but she was unable to answer questions accurately. Patient was noted to have memory issues related to dementia. Called and spoke with daughter in-law [name] [name]) and she stated that patient's son and husband are both out and busy arranging patient's transition to the rehab facility. Daughter in-law provided informations needed and denied any discharge needs at this time.  Daughter in-lawreports that patient had weakness, "complained of non-specific pain when moving or walking and confusion is greater than normal" thathad led to this admission. Daughter in-law endorses (Dr.) Eliezer Lofts with [employer] at [facility] as the primary care provider for patient.  Per daughter in-law, patient has [facility] [address] to obtain medications without difficulty. Daughter in-law verbalized that patient's granddaughter [name] [name]- CNA) has been managing medications at home using "pill box" system filled once weekly.  Patient's family (son, granddaughter or daughter in-law) provide transportation to her doctors' appointments. According to daughter in-law, patient lives with husband [name] [name]) at home who serves as the primary caregiver (taking care of patient at nights) and granddaughter assists in taking care of patient during the day.   Anticipated plan for discharge is skilled nursing facility (SNF) for rehabilitation per therapy recommendation.  Patient's daughter in-lawvoiced understanding to callprimary care provider's officeonce patient returnsbackhome,to schedulea post discharge follow-upappointment within1-2 weeksor sooner if needed. Patient letter (with PCP's contact number) was provided at the bedside (with her belongings) as theirreminder and daughter in-law was made aware of it.   Explained topatient's daughter  in-lawregardingTHN CM services available for health managementat home but sheindicatedof not having any current needs or concerns at this point.  Patient's daughter in-law expressed understandingto seek referral from primary care provider to [facility] care management asdeemed necessary and appropriatefor services in the future (when patientreturns back home).  [facility] care management information provided for future needs that may arise.   For questions, please contact:  Dannielle Huh, BSN, RN- [facility] Primary Care Navigator  Telephone: [phone] [place]

## 2017-03-15 NOTE — Discharge Summary (Signed)
Physician Discharge Summary  [name] [name] VZD:[id number] DOB: 1933/06/21 DOA: 03/11/2017  PCP: Jinny Sanders, MD  Admit date: 03/11/2017 Discharge date: 03/15/2017  Admitted From: Home Disposition: Nursing home  Recommendations for Outpatient Follow-up:  1. Follow up with nursing home provider at earliest convenience with repeat CBC/BMP in a week  Home Health: No  equipment/Devices: None  Discharge Condition: Stable CODE STATUS: Full Diet recommendation: Heart Healthy   Brief/Interim Summary: 84 year old female with history of hypertension, hyperlipidemia, diet-controlled diabetes, Alzheimer's disease, breast cancer status post left lumpectomy presented with altered mental status and generalized weakness.  She was admitted with UTI on intravenous antibiotics.  Cultures have been negative so far.  Hemodynamically improving.  Switch to oral antibiotics for 3 more days.  PT recommended nursing home placement.  Discharge to nursing home once bed is available.    Discharge Diagnoses:  Principal Problem:   UTI (urinary tract infection) Active Problems:   HYPERCHOLESTEROLEMIA   Alzheimer's dementia without behavioral disturbance, moderate severe, 05/2016 12/30   Controlled type 2 diabetes mellitus with diabetic neuropathy (HCC)   Acute metabolic encephalopathy   Hyperkalemia   Sepsis ([place])   UTIand sepsis: -Cultures negative so far.  Hemodynamically improved.   -Sepsis has resolved  -Started on Rocephin.  Switch to Ceftin for 3 more days on discharge.   Acute metabolic encephalopathy: -possibly due to sepsis -Mental status has much improved and is almost at baseline as per the family members. -CT-head 03/12/17: Volume loss and chronic microvascular disease without acute intracranial abnormality  Leukocytosis -Resolved  Hyperlipidemia: -Continue Lipitor  Alzheimer's dementia without behavioral disturbance -continuegalantamine -Fall precautions -Monitor mental  status  HTN:  -BP stable -Continue Lisinopril and atenolol  Diet controlledtype 2 diabetes mellitus with diabetic neuropathy: -Last A1c7.2 on 10/14/16 -Outpatient follow-up  Hyperkalemia -Resolved  Generalized deconditioning -Continue physical therapy in the nursing home     Discharge Instructions  Discharge Instructions    Call MD for:  difficulty breathing, headache or visual disturbances   Complete by:  As directed    Call MD for:  extreme fatigue   Complete by:  As directed    Call MD for:  hives   Complete by:  As directed    Call MD for:  persistant dizziness or light-headedness   Complete by:  As directed    Call MD for:  persistant nausea and vomiting   Complete by:  As directed    Call MD for:  severe uncontrolled pain   Complete by:  As directed    Call MD for:  temperature >100.4   Complete by:  As directed    Diet - low sodium heart healthy   Complete by:  As directed    Increase activity slowly   Complete by:  As directed      Allergies as of 03/15/2017      Reactions   Sulfonamide Derivatives    REACTION: rash   Sulfur    hives      Medication List    STOP taking these medications   diclofenac 75 MG EC tablet Commonly known as:  VOLTAREN     TAKE these medications   atenolol 25 MG tablet Commonly known as:  TENORMIN TAKE 1 TABLET BY MOUTH DAILY What changed:    how much to take  how to take this  when to take this   atorvastatin 20 MG tablet Commonly known as:  LIPITOR TAKE 4 TABLETS (80MG ) ONCE DAILY FOR CHOLESTEROL. *PATIENT NEEDS SMALLER  TABSFOR SWALLOWING*   CALCIUM 600+D 600-200 MG-UNIT Tabs Generic drug:  Calcium Carbonate-Vitamin D Take 1 tablet by mouth 2 (two) times daily.   cefUROXime 250 MG tablet Commonly known as:  CEFTIN Take 1 tablet (250 mg total) by mouth 2 (two) times daily with a meal for 3 days.   galantamine 12 MG tablet Commonly known as:  RAZADYNE Take 1 tablet (12 mg total) by mouth 2 (two)  times daily.   lisinopril 20 MG tablet Commonly known as:  PRINIVIL,ZESTRIL TAKE 1 TABLET BY MOUTH DAILY What changed:    how much to take  how to take this  when to take this   thiamine 100 MG tablet Commonly known as:  VITAMIN B-1 Take 100 mg by mouth daily.      Contact information for after-discharge care    [facility] Follow up.   Service:  Skilled Nursing Contact information: [place] [phone]             Allergies  Allergen Reactions  . Sulfonamide Derivatives     REACTION: rash  . Sulfur     hives    Consultations: None  Procedures/Studies: Ct Head Wo Contrast  Result Date: 03/12/2017 CLINICAL DATA:  Altered mental status EXAM: CT HEAD WITHOUT CONTRAST TECHNIQUE: Contiguous axial images were obtained from the base of the skull through the vertex without intravenous contrast. COMPARISON:  Head CT 01/09/2007 FINDINGS: Brain: No mass lesion, intraparenchymal hemorrhage or extra-axial collection. No evidence of acute cortical infarct. There is periventricular hypoattenuation compatible with chronic microvascular disease. Generalized volume loss. Vascular: No hyperdense vessel or unexpected calcification. Skull: Small left frontal osteoma. Sinuses/Orbits: No sinus fluid levels or advanced mucosal thickening. No mastoid effusion. Normal orbits. IMPRESSION: Volume loss and chronic microvascular disease without acute intracranial abnormality. Electronically Signed   By: Ulyses Jarred M.D.   On: 03/12/2017 01:40   Dg Chest Portable 1 View  Result Date: 03/11/2017 CLINICAL DATA:  Suspected sepsis EXAM: PORTABLE CHEST 1 VIEW COMPARISON:  07/15/2008 FINDINGS: Borderline cardiomegaly. Mild central vascular congestion. Mild diffuse interstitial prominence suggesting edema. No focal consolidation or effusion. No pneumothorax. Aortic atherosclerosis IMPRESSION: 1. Low lung volumes. 2.  Borderline to mild cardiomegaly with mild central vascular congestion and diffuse interstitial opacities suggesting minimal edema 3. No focal pulmonary infiltrate Electronically Signed   By: Donavan Foil M.D.   On: 03/11/2017 23:13      Subjective: Patient seen and examined at bedside.  She is awake but still slightly confused.  No overnight fever or vomiting.  Spoke to family members at bedside.  Family thinks that patient is at baseline mental status    Discharge Exam: Vitals:   03/15/17 0501 03/15/17 0858  BP: 135/81 (!) 161/77  Pulse: 80 83  Resp: 19 18  Temp: 98.2 F (36.8 C)   SpO2: 99%    Vitals:   03/14/17 1442 03/14/17 2317 03/15/17 0501 03/15/17 0858  BP: 118/82 (!) 179/86 135/81 (!) 161/77  Pulse: 76 69 80 83  Resp: 19 (!) 24 19 18   Temp: 98 F (36.7 C) 99.3 F (37.4 C) 98.2 F (36.8 C)   TempSrc: Oral     SpO2: 98% 93% 99%   Weight:      Height:         General exam: Appears calm and comfortable but still slightly confused Respiratory system: Bilateral decreased breath sound at bases Cardiovascular system: S1 & S2 heard,  rate controlled  gastrointestinal system: Abdomen is nondistended, soft and nontender. Normal bowel sounds heard. Extremities: No cyanosis, clubbing, edema     The results of significant diagnostics from this hospitalization (including imaging, microbiology, ancillary and laboratory) are listed below for reference.     Microbiology: Recent Results (from the past 240 hour(s))  Blood Culture (routine x 2)     Status: None (Preliminary result)   Collection Time: 03/11/17  9:33 PM  Result Value Ref Range Status   Specimen Description BLOOD LEFT FOREARM  Final   Special Requests   Final    BOTTLES DRAWN AEROBIC AND ANAEROBIC Blood Culture adequate volume   Culture NO GROWTH 4 DAYS  Final   Report Status PENDING  Incomplete  Blood Culture (routine x 2)     Status: None (Preliminary result)   Collection Time: 03/11/17 10:07 PM  Result  Value Ref Range Status   Specimen Description BLOOD RIGHT FOREARM  Final   Special Requests IN PEDIATRIC BOTTLE Blood Culture adequate volume  Final   Culture NO GROWTH 4 DAYS  Final   Report Status PENDING  Incomplete  Urine culture     Status: Abnormal   Collection Time: 03/11/17 11:08 PM  Result Value Ref Range Status   Specimen Description URINE, CATHETERIZED  Final   Special Requests NONE  Final   Culture MULTIPLE SPECIES PRESENT, SUGGEST RECOLLECTION (A)  Final   Report Status 03/13/2017 FINAL  Final     Labs: BNP (last 3 results) Recent Labs    03/12/17 0117  BNP 25.8   Basic Metabolic Panel: Recent Labs  Lab 03/11/17 2132 03/12/17 0117 03/12/17 0557 03/13/17 0149 03/14/17 0230  NA 134* 134* 134* 138 140  K 5.2* 3.9 3.7 3.7 3.5  CL 100* 101 102 105 108  CO2 21* 21* 23 23 23   GLUCOSE 167* 170* 146* 114* 107*  BUN 18 18 18 16 14   CREATININE 0.88 0.93 0.86 0.80 0.74  CALCIUM 9.0 8.4* 8.2* 8.3* 8.5*   Liver Function Tests: Recent Labs  Lab 03/11/17 2132 03/12/17 0117  AST 38 19  ALT 18 16  ALKPHOS 97 83  BILITOT 2.1* 0.8  PROT 7.2 6.3*  ALBUMIN 3.5 3.2*   No results for input(s): LIPASE, AMYLASE in the last 168 hours. No results for input(s): AMMONIA in the last 168 hours. CBC: Recent Labs  Lab 03/11/17 2132 03/12/17 0557 03/13/17 0149  WBC 13.2* 12.0* 10.5  NEUTROABS 10.8*  --  7.7  HGB 14.5 12.0 12.3  HCT 43.5 37.3 38.1  MCV 95.2 95.2 95.5  PLT 240 218 224   Cardiac Enzymes: No results for input(s): CKTOTAL, CKMB, CKMBINDEX, TROPONINI in the last 168 hours. BNP: Invalid input(s): POCBNP CBG: Recent Labs  Lab 03/12/17 0921 03/13/17 0744 03/14/17 0800 03/15/17 0818  GLUCAP 107* 101* 108* 108*   D-Dimer No results for input(s): DDIMER in the last 72 hours. Hgb A1c No results for input(s): HGBA1C in the last 72 hours. Lipid Profile No results for input(s): CHOL, HDL, LDLCALC, TRIG, CHOLHDL, LDLDIRECT in the last 72 hours. Thyroid  function studies No results for input(s): TSH, T4TOTAL, T3FREE, THYROIDAB in the last 72 hours.  Invalid input(s): FREET3 Anemia work up No results for input(s): VITAMINB12, FOLATE, FERRITIN, TIBC, IRON, RETICCTPCT in the last 72 hours. Urinalysis    Component Value Date/Time   COLORURINE YELLOW 03/11/2017 2300   APPEARANCEUR TURBID (A) 03/11/2017 2300   LABSPEC 1.020 03/11/2017 2300   PHURINE 5.0 03/11/2017 2300  GLUCOSEU NEGATIVE 03/11/2017 2300   HGBUR SMALL (A) 03/11/2017 2300   BILIRUBINUR NEGATIVE 03/11/2017 2300   BILIRUBINUR negative 01/30/2014 1455   KETONESUR NEGATIVE 03/11/2017 2300   PROTEINUR 100 (A) 03/11/2017 2300   UROBILINOGEN 0.2 01/30/2014 1455   UROBILINOGEN 0.2 01/09/2007 2350   NITRITE POSITIVE (A) 03/11/2017 2300   LEUKOCYTESUR LARGE (A) 03/11/2017 2300   Sepsis Labs Invalid input(s): PROCALCITONIN,  WBC,  LACTICIDVEN Microbiology Recent Results (from the past 240 hour(s))  Blood Culture (routine x 2)     Status: None (Preliminary result)   Collection Time: 03/11/17  9:33 PM  Result Value Ref Range Status   Specimen Description BLOOD LEFT FOREARM  Final   Special Requests   Final    BOTTLES DRAWN AEROBIC AND ANAEROBIC Blood Culture adequate volume   Culture NO GROWTH 4 DAYS  Final   Report Status PENDING  Incomplete  Blood Culture (routine x 2)     Status: None (Preliminary result)   Collection Time: 03/11/17 10:07 PM  Result Value Ref Range Status   Specimen Description BLOOD RIGHT FOREARM  Final   Special Requests IN PEDIATRIC BOTTLE Blood Culture adequate volume  Final   Culture NO GROWTH 4 DAYS  Final   Report Status PENDING  Incomplete  Urine culture     Status: Abnormal   Collection Time: 03/11/17 11:08 PM  Result Value Ref Range Status   Specimen Description URINE, CATHETERIZED  Final   Special Requests NONE  Final   Culture MULTIPLE SPECIES PRESENT, SUGGEST RECOLLECTION (A)  Final   Report Status 03/13/2017 FINAL  Final     Time  coordinating discharge: 35 minutes  SIGNED:   Aline August, MD  [facility] 03/15/2017, 2:06 PM Pager: [phone]  If 7PM-7AM, please contact night-coverage [email or web address] Password [email or web address]

## 2017-03-15 NOTE — Evaluation (Signed)
Physical Therapy Evaluation Patient Details Name: [name] [name] MRN: [id number] DOB: Mar 26, 1933 Today's Date: 03/15/2017   History of Present Illness  Pt is an 84 y/o female admitted secondary to AMS and generalized weakness. Found to have UTI. CT negative for acute abnormality. PMH inlcudes dementia, DM, HTN, and L breast cancer s/p lumpectomy  Clinical Impression  Pt admitted secondary to problem above with deficits below. Pt requiring mod-max A  +2 to stand at EOB this session and demonstrated heavy posterior lean. Unable to attempt gait, as pt with limited standing tolerance. Pt's family concerned about taking pt home as pt is not walking as she was before. Pt currently a high fall risk. Will continue to follow acutely to maximize functional mobility independence and safety.     Follow Up Recommendations SNF;Supervision/Assistance - 24 hour    Equipment Recommendations  None recommended by PT    Recommendations for Other Services       Precautions / Restrictions Precautions Precautions: Fall;Other (comment) Precaution Comments: Telesitter in place Restrictions Weight Bearing Restrictions: No      Mobility  Bed Mobility Overal bed mobility: Needs Assistance Bed Mobility: Supine to Sit;Sit to Supine     Supine to sit: Mod assist;+2 for physical assistance Sit to supine: Mod assist;+2 for physical assistance   General bed mobility comments: Mod A +2 for trunk elevation and scooting hips to EOB. Increased time required for processing and required manual cues for initiation of bed mobility. Mod A +2 to return to supine.   Transfers Overall transfer level: Needs assistance Equipment used: Rolling walker (2 wheeled) Transfers: Sit to/from Stand Sit to Stand: Mod assist;Max assist;+2 physical assistance         General transfer comment: Sit<>stand X 4. Pt with posterior lean and demonstrated increased fatigue with last couple of stands and only able to tolerate short time  standing. Pt's family assisted with transfer and with pulling up depends. Required multiple attempts to pull up depends secondary to pt fatigue in standing.   Ambulation/Gait             General Gait Details: unsafe to attempt   Stairs            Wheelchair Mobility    Modified Rankin (Stroke Patients Only)       Balance Overall balance assessment: Needs assistance Sitting-balance support: Feet supported;Bilateral upper extremity supported Sitting balance-Leahy Scale: Poor Sitting balance - Comments: Posterior lean in sitting initially. Got better as time progressed, however, required min guard to mod A for trunk support.  Postural control: Posterior lean Standing balance support: Bilateral upper extremity supported;During functional activity Standing balance-Leahy Scale: Poor Standing balance comment: Posterior lean in standing. Required use of BUE and external support to stand.                              Pertinent Vitals/Pain Pain Assessment: No/denies pain    Home Living Family/patient expects to be discharged to:: Skilled nursing facility Living Arrangements: Spouse/significant other Available Help at Discharge: Family;Available 24 hours/day Type of Home: House Home Access: Other (comment)(threshold step to get in )     Home Layout: One level Home Equipment: Walker - 2 wheels;Wheelchair - manual;Grab bars - tub/shower;Shower seat;Other (comment)(lift chair ) Additional Comments: Family assisted with home information and PLOF information.     Prior Function Level of Independence: Needs assistance   Gait / Transfers Assistance Needed: Reports she would walk with  and without RW at times. Only requiring supervision  ADL's / Homemaking Assistance Needed: Needed assist from family for bathing/dressing and IADL tasks.         Hand Dominance        Extremity/Trunk Assessment   Upper Extremity Assessment Upper Extremity Assessment:  Generalized weakness    Lower Extremity Assessment Lower Extremity Assessment: Generalized weakness    Cervical / Trunk Assessment Cervical / Trunk Assessment: Kyphotic  Communication   Communication: No difficulties  Cognition Arousal/Alertness: Awake/alert Behavior During Therapy: WFL for tasks assessed/performed Overall Cognitive Status: History of cognitive impairments - at baseline                                 General Comments: Per family pt is at her baseline. Dementia at baseline.       General Comments General comments (skin integrity, edema, etc.): Pt's family concerned about taking pt home given pt current deficits. Educated about SNF and pt and family agreeable.     Exercises     Assessment/Plan    PT Assessment Patient needs continued PT services  PT Problem List Decreased strength;Decreased balance;Decreased mobility;Decreased cognition;Decreased knowledge of use of DME;Decreased safety awareness;Decreased knowledge of precautions       PT Treatment Interventions DME instruction;Gait training;Functional mobility training;Therapeutic activities;Therapeutic exercise;Balance training;Neuromuscular re-education;Patient/family education    PT Goals (Current goals can be found in the Care Plan section)  Acute Rehab PT Goals Patient Stated Goal: to get pt walking per husband  PT Goal Formulation: With family Time For Goal Achievement: 03/29/17 Potential to Achieve Goals: Fair    Frequency Min 2X/week   Barriers to discharge        Co-evaluation               AM-PAC PT "6 Clicks" Daily Activity  Outcome Measure Difficulty turning over in bed (including adjusting bedclothes, sheets and blankets)?: Unable Difficulty moving from lying on back to sitting on the side of the bed? : Unable Difficulty sitting down on and standing up from a chair with arms (e.g., wheelchair, bedside commode, etc,.)?: Unable Help needed moving to and from a bed  to chair (including a wheelchair)?: Total Help needed walking in hospital room?: Total Help needed climbing 3-5 steps with a railing? : Total 6 Click Score: 6    End of Session Equipment Utilized During Treatment: Gait belt Activity Tolerance: Patient tolerated treatment well Patient left: in bed;with call bell/phone within reach;with family/visitor present(telesitter in room ) Nurse Communication: Mobility status PT Visit Diagnosis: Unsteadiness on feet (R26.81);Muscle weakness (generalized) (M62.81);Difficulty in walking, not elsewhere classified (R26.2)    Time: 7915-0569 PT Time Calculation (min) (ACUTE ONLY): 24 min   Charges:   PT Evaluation $PT Eval Moderate Complexity: 1 Mod PT Treatments $Therapeutic Activity: 8-22 mins   PT G Codes:        Leighton Ruff, PT, DPT  Acute Rehabilitation Services  Pager: [phone]   Rudean Hitt 03/15/2017, 11:33 AM

## 2017-03-15 NOTE — Clinical Social Work Note (Signed)
Clinical Social Work Assessment  Patient Details  Name: [name] [name] MRN: [id number] Date of Birth: [birth year]  Date of referral:  03/15/17               Reason for consult:  Facility Placement                Permission sought to share information with:  Facility Sport and exercise psychologist, Family Supports Permission granted to share information::  Yes, Verbal Permission Granted  Name::     [facility]  Agency::  SNFs  Relationship::  Son  Contact Information:  [phone]  Housing/Transportation Living arrangements for the past 2 months:  Single Family Home Source of Information:  Patient, Adult Children, Spouse Patient Interpreter Needed:  None Criminal Activity/Legal Involvement Pertinent to Current Situation/Hospitalization:  No - Comment as needed Significant Relationships:  Adult Children, Spouse Lives with:  Spouse Do you feel safe going back to the place where you live?  No Need for family participation in patient care:  Yes (Comment)  Care giving concerns:  CSW received consult for possible SNF placement at time of discharge. CSW spoke with patient's spouse and other family members in the room regarding PT recommendation of SNF placement at time of discharge. Patient's spouse reported that patient's spouse is currently unable to care for patient at their home given patient's current physical needs and fall risk. Patient's family expressed understanding of PT recommendation and is agreeable to SNF placement at time of discharge. CSW to continue to follow and assist with discharge planning needs.   Social Worker assessment / plan:  CSW spoke with patient's family concerning possibility of rehab at [facility] before returning home.  Employment status:  Retired Forensic scientist:  Medicare PT Recommendations:  [place] / Referral to community resources:  [place]  Patient/Family's Response to care:  Patient's family recognizes need for rehab  before returning home and is agreeable to a SNF. Patient's son reported preference for [facility].  Patient/Family's Understanding of and Emotional Response to Diagnosis, Current Treatment, and Prognosis:  Patient/family is realistic regarding therapy needs and expressed being hopeful for SNF placement. Patient's family expressed understanding of CSW role and discharge process as well as medical condition. No questions/concerns about plan or treatment.    Emotional Assessment Appearance:  Appears stated age Attitude/Demeanor/Rapport:  Unable to Assess Affect (typically observed):  Quiet Orientation:  (Disoriented x4) Alcohol / Substance use:  Not Applicable Psych involvement (Current and /or in the community):  No (Comment)  Discharge Needs  Concerns to be addressed:  Care Coordination Readmission within the last 30 days:  No Current discharge risk:    Barriers to Discharge:  No Barriers Identified   Benard Halsted, [place] 03/15/2017, 11:32 AM

## 2017-03-15 NOTE — Progress Notes (Signed)
PT working with patient and Patient family is agreeable to SNF for rehab, left voicemail with social work.

## 2017-03-15 NOTE — Progress Notes (Signed)
Called report to provider at [facility]. All iv sites and telemetry were removed. Tele sitter was discontinued. Belongings sent with pt to facility.

## 2017-03-15 NOTE — NC FL2 (Signed)
[place] LEVEL OF CARE SCREENING TOOL     IDENTIFICATION  Patient Name: [name] [name] Birthdate: 11-11-1933 Sex: female Admission Date (Current Location): 03/11/2017  [facility] and [place] Number:  Herbalist and Address:  [facility], [facility] [address], [place], [place] [address]      Provider Number: [id number]  Attending Physician Name and Address:  Aline August, MD  Relative Name and Phone Number:       Current Level of Care: Hospital Recommended Level of Care: [place] Prior Approval Number:    Date Approved/Denied:   PASRR Number: [id number]  Discharge Plan: SNF    Current Diagnoses: Patient Active Problem List   Diagnosis Date Noted  . Acute metabolic encephalopathy 82/50/5397  . Hyperkalemia 03/12/2017  . UTI (urinary tract infection) 03/12/2017  . Lower urinary tract infectious disease   . Sepsis ([place])   . Insomnia 12/25/2015  . Counseling regarding end of life decision making 10/03/2014  . Controlled type 2 diabetes mellitus with diabetic neuropathy ([place]) 10/03/2014  . Bowel incontinence 01/30/2014  . Chronic venous insufficiency 08/15/2013  . Peripheral edema 07/30/2013  . Vitamin D deficiency 02/13/2013  . Neuropathy due to type 2 diabetes mellitus ([place]) 05/17/2011  . Uterine prolapse 11/29/2010  . Urine incontinence 11/29/2010  . Alzheimer's dementia without behavioral disturbance, moderate severe, 05/2016 12/30 07/05/2010  . Breast cancer in situ 07/30/2008    Class: Diagnosis of  . Ductal carcinoma in situ (DCIS) of left breast 07/11/2008  . SEBORRHEIC KERATOSIS 06/20/2007  . HYPERCHOLESTEROLEMIA 05/12/2006  . Essential hypertension, benign 05/12/2006  . DIVERTICULOSIS, COLON 05/12/2006  . VAGINITIS, ATROPHIC 05/12/2006  . Osteopenia 05/12/2006  . TREMOR, ESSENTIAL 09/20/2005  . SYNCOPE, HX OF 09/20/2005    Orientation RESPIRATION BLADDER Height & Weight        Normal  Incontinent, External catheter Weight: 214 lb (97.1 kg) Height:  5\' 7"  (170.2 cm)  BEHAVIORAL SYMPTOMS/MOOD NEUROLOGICAL BOWEL NUTRITION STATUS      Incontinent Diet(see DC summary)  AMBULATORY STATUS COMMUNICATION OF NEEDS Skin   Extensive Assist Verbally Normal                       Personal Care Assistance Level of Assistance  Bathing, Dressing Bathing Assistance: Maximum assistance   Dressing Assistance: Maximum assistance     Functional Limitations Info             SPECIAL CARE FACTORS FREQUENCY  PT (By licensed PT), OT (By licensed OT)     PT Frequency: 5/wk OT Frequency: 5/wk            Contractures      Additional Factors Info  Code Status, Allergies Code Status Info: FULL Allergies Info: Sulfonamide Derivatives, Sulfur           Current Medications (03/15/2017):  This is the current hospital active medication list Current Facility-Administered Medications  Medication Dose Route Frequency Provider Last Rate Last Dose  . 0.9 %  sodium chloride infusion  1,000 mL Intravenous Continuous Lorin Glass, PA-C 125 mL/hr at 03/14/17 0432 1,000 mL at 03/14/17 0432  . acetaminophen (TYLENOL) tablet 650 mg  650 mg Oral Q6H PRN Ivor Costa, MD      . atenolol (TENORMIN) tablet 25 mg  25 mg Oral Daily Ivor Costa, MD   25 mg at 03/15/17 1010  . atorvastatin (LIPITOR) tablet 80 mg  80 mg Oral q1800 Ivor Costa, MD  80 mg at 03/14/17 1851  . calcium-vitamin D (OSCAL WITH D) 500-200 MG-UNIT per tablet 1 tablet  1 tablet Oral BID WC Ivor Costa, MD   1 tablet at 03/15/17 1010  . cefUROXime (CEFTIN) tablet 250 mg  250 mg Oral BID WC Alekh, Kshitiz, MD   250 mg at 03/15/17 1009  . diclofenac (VOLTAREN) EC tablet 75 mg  75 mg Oral BID Ivor Costa, MD   75 mg at 03/15/17 1010  . enoxaparin (LOVENOX) injection 40 mg  40 mg Subcutaneous Q24H Ivor Costa, MD   40 mg at 03/14/17 1250  . galantamine (RAZADYNE) tablet 12 mg  12 mg Oral BID Ivor Costa, MD   12 mg at 03/15/17  1009  . hydrALAZINE (APRESOLINE) injection 5 mg  5 mg Intravenous Q2H PRN Ivor Costa, MD      . lisinopril (PRINIVIL,ZESTRIL) tablet 20 mg  20 mg Oral Daily Ivor Costa, MD   20 mg at 03/15/17 1009  . ondansetron (ZOFRAN) injection 4 mg  4 mg Intravenous Q8H PRN Ivor Costa, MD      . polyethylene glycol (MIRALAX / GLYCOLAX) packet 17 g  17 g Oral Daily PRN Ivor Costa, MD   17 g at 03/15/17 1010  . thiamine (VITAMIN B-1) tablet 100 mg  100 mg Oral Daily Ivor Costa, MD   100 mg at 03/15/17 1010  . zolpidem (AMBIEN) tablet 5 mg  5 mg Oral QHS PRN Ivor Costa, MD   5 mg at 03/14/17 2214     Discharge Medications: Please see discharge summary for a list of discharge medications.  Relevant Imaging Results:  Relevant Lab Results:   Additional Information SS#: [id number]  Jorge Ny, LCSW

## 2017-03-16 LAB — CULTURE, BLOOD (ROUTINE X 2)
CULTURE: NO GROWTH
CULTURE: NO GROWTH
SPECIAL REQUESTS: ADEQUATE
SPECIAL REQUESTS: ADEQUATE

## 2017-03-17 DIAGNOSIS — A419 Sepsis, unspecified organism: Secondary | ICD-10-CM | POA: Diagnosis not present

## 2017-03-17 DIAGNOSIS — I10 Essential (primary) hypertension: Secondary | ICD-10-CM | POA: Diagnosis not present

## 2017-03-17 DIAGNOSIS — R531 Weakness: Secondary | ICD-10-CM | POA: Diagnosis not present

## 2017-03-17 DIAGNOSIS — F0390 Unspecified dementia without behavioral disturbance: Secondary | ICD-10-CM | POA: Diagnosis not present

## 2017-03-17 DIAGNOSIS — R4182 Altered mental status, unspecified: Secondary | ICD-10-CM | POA: Diagnosis not present

## 2017-03-17 DIAGNOSIS — N390 Urinary tract infection, site not specified: Secondary | ICD-10-CM | POA: Diagnosis not present

## 2017-03-17 DIAGNOSIS — E1140 Type 2 diabetes mellitus with diabetic neuropathy, unspecified: Secondary | ICD-10-CM | POA: Diagnosis not present

## 2017-03-23 DIAGNOSIS — R339 Retention of urine, unspecified: Secondary | ICD-10-CM | POA: Diagnosis not present

## 2017-03-23 DIAGNOSIS — N390 Urinary tract infection, site not specified: Secondary | ICD-10-CM | POA: Diagnosis not present

## 2017-03-23 DIAGNOSIS — E1140 Type 2 diabetes mellitus with diabetic neuropathy, unspecified: Secondary | ICD-10-CM | POA: Diagnosis not present

## 2017-03-23 DIAGNOSIS — G309 Alzheimer's disease, unspecified: Secondary | ICD-10-CM | POA: Diagnosis not present

## 2017-03-28 DIAGNOSIS — E1140 Type 2 diabetes mellitus with diabetic neuropathy, unspecified: Secondary | ICD-10-CM | POA: Diagnosis not present

## 2017-03-28 DIAGNOSIS — G309 Alzheimer's disease, unspecified: Secondary | ICD-10-CM | POA: Diagnosis not present

## 2017-03-28 DIAGNOSIS — R531 Weakness: Secondary | ICD-10-CM | POA: Diagnosis not present

## 2017-03-28 DIAGNOSIS — R339 Retention of urine, unspecified: Secondary | ICD-10-CM | POA: Diagnosis not present

## 2017-04-04 DIAGNOSIS — E1140 Type 2 diabetes mellitus with diabetic neuropathy, unspecified: Secondary | ICD-10-CM | POA: Diagnosis not present

## 2017-04-04 DIAGNOSIS — I10 Essential (primary) hypertension: Secondary | ICD-10-CM | POA: Diagnosis not present

## 2017-04-04 DIAGNOSIS — R531 Weakness: Secondary | ICD-10-CM | POA: Diagnosis not present

## 2017-04-04 DIAGNOSIS — G309 Alzheimer's disease, unspecified: Secondary | ICD-10-CM | POA: Diagnosis not present

## 2017-04-11 ENCOUNTER — Other Ambulatory Visit: Payer: Medicare Other

## 2017-04-11 ENCOUNTER — Telehealth: Payer: Self-pay | Admitting: Family Medicine

## 2017-04-11 DIAGNOSIS — G309 Alzheimer's disease, unspecified: Secondary | ICD-10-CM | POA: Diagnosis not present

## 2017-04-11 DIAGNOSIS — E1140 Type 2 diabetes mellitus with diabetic neuropathy, unspecified: Secondary | ICD-10-CM

## 2017-04-11 DIAGNOSIS — E7800 Pure hypercholesterolemia, unspecified: Secondary | ICD-10-CM

## 2017-04-11 DIAGNOSIS — F0280 Dementia in other diseases classified elsewhere without behavioral disturbance: Secondary | ICD-10-CM | POA: Diagnosis not present

## 2017-04-11 NOTE — Telephone Encounter (Signed)
-----   Message from Ellamae Sia sent at 04/05/2017 10:17 AM EST ----- Regarding: Lab orders for Tuesday, 2.26.19 Lab orders for a 6 month follow up appt

## 2017-04-12 DIAGNOSIS — G309 Alzheimer's disease, unspecified: Secondary | ICD-10-CM | POA: Diagnosis not present

## 2017-04-12 DIAGNOSIS — F0280 Dementia in other diseases classified elsewhere without behavioral disturbance: Secondary | ICD-10-CM | POA: Diagnosis not present

## 2017-04-13 ENCOUNTER — Telehealth: Payer: Self-pay

## 2017-04-13 DIAGNOSIS — G309 Alzheimer's disease, unspecified: Secondary | ICD-10-CM | POA: Diagnosis not present

## 2017-04-13 DIAGNOSIS — F0280 Dementia in other diseases classified elsewhere without behavioral disturbance: Secondary | ICD-10-CM | POA: Diagnosis not present

## 2017-04-13 NOTE — Telephone Encounter (Signed)
Spoke with [name] with [name] and advised her that Dr. Diona Browner would sign order on Ms. [name] [name].

## 2017-04-13 NOTE — Telephone Encounter (Signed)
Dr Diona Browner last saw pt for annual 10/14/16 and 03/11/17 pt was admitted to [facility] for sepsis.

## 2017-04-13 NOTE — Telephone Encounter (Signed)
Copied from [place]. Topic: Inquiry >> Apr 13, 2017  9:07 AM Pricilla Handler wrote: Reason for CRM: [name] with [name] [phone]) called wanting to know is Dr. Diona Browner would be willing to sign off on orders for this patient. Someone from the office, please call [name] at [phone].       Thank You!!!

## 2017-04-14 ENCOUNTER — Ambulatory Visit: Payer: Medicare Other | Admitting: Family Medicine

## 2017-04-14 ENCOUNTER — Telehealth: Payer: Self-pay | Admitting: Family Medicine

## 2017-04-14 DIAGNOSIS — F0280 Dementia in other diseases classified elsewhere without behavioral disturbance: Secondary | ICD-10-CM | POA: Diagnosis not present

## 2017-04-14 DIAGNOSIS — G309 Alzheimer's disease, unspecified: Secondary | ICD-10-CM | POA: Diagnosis not present

## 2017-04-14 NOTE — Telephone Encounter (Signed)
Okay to given verbal orders as requested. 

## 2017-04-14 NOTE — Telephone Encounter (Signed)
Copied from [place]. Topic: Quick Communication - See Telephone Encounter >> Apr 14, 2017  1:22 PM [name] Kerbs wrote: CRM for notification. See Telephone encounter for:   [name] [name] [phone] Verbal orders  OT 1 x 3; exercise, activity program, transfer training and DC when goals met or max. Potential   04/14/17.

## 2017-04-14 NOTE — Telephone Encounter (Signed)
Left message for [name] [name] with [name] giving verbal orders for OT 1 x week for 3 weeks; exercise, activity program, transfer training and D/C when goals met or at max potential per Dr. Diona Browner.

## 2017-04-19 DIAGNOSIS — G309 Alzheimer's disease, unspecified: Secondary | ICD-10-CM | POA: Diagnosis not present

## 2017-04-19 DIAGNOSIS — F0280 Dementia in other diseases classified elsewhere without behavioral disturbance: Secondary | ICD-10-CM | POA: Diagnosis not present

## 2017-04-20 DIAGNOSIS — G309 Alzheimer's disease, unspecified: Secondary | ICD-10-CM | POA: Diagnosis not present

## 2017-04-20 DIAGNOSIS — F0280 Dementia in other diseases classified elsewhere without behavioral disturbance: Secondary | ICD-10-CM | POA: Diagnosis not present

## 2017-04-21 DIAGNOSIS — G309 Alzheimer's disease, unspecified: Secondary | ICD-10-CM | POA: Diagnosis not present

## 2017-04-21 DIAGNOSIS — F0280 Dementia in other diseases classified elsewhere without behavioral disturbance: Secondary | ICD-10-CM | POA: Diagnosis not present

## 2017-04-26 ENCOUNTER — Telehealth: Payer: Self-pay | Admitting: *Deleted

## 2017-04-26 DIAGNOSIS — G309 Alzheimer's disease, unspecified: Secondary | ICD-10-CM | POA: Diagnosis not present

## 2017-04-26 DIAGNOSIS — F0280 Dementia in other diseases classified elsewhere without behavioral disturbance: Secondary | ICD-10-CM | POA: Diagnosis not present

## 2017-04-26 NOTE — Telephone Encounter (Signed)
Left message for [name] [name] with [name] [name] giving verbal order for social worker for [name] [name].

## 2017-04-26 NOTE — Telephone Encounter (Signed)
Copied from [place] [phone]. Topic: Inquiry >> Apr 26, 2017 11:19 AM Pricilla Handler wrote: Reason for CRM: Mel with [facility] called requesting a verbal approval for a Education officer, museum. Please call Mel at [phone].       Thank You!!!

## 2017-04-27 DIAGNOSIS — G309 Alzheimer's disease, unspecified: Secondary | ICD-10-CM | POA: Diagnosis not present

## 2017-04-27 DIAGNOSIS — F0280 Dementia in other diseases classified elsewhere without behavioral disturbance: Secondary | ICD-10-CM | POA: Diagnosis not present

## 2017-04-28 ENCOUNTER — Ambulatory Visit (INDEPENDENT_AMBULATORY_CARE_PROVIDER_SITE_OTHER): Payer: Medicare Other | Admitting: Family Medicine

## 2017-04-28 ENCOUNTER — Other Ambulatory Visit (INDEPENDENT_AMBULATORY_CARE_PROVIDER_SITE_OTHER): Payer: Medicare Other

## 2017-04-28 ENCOUNTER — Ambulatory Visit (INDEPENDENT_AMBULATORY_CARE_PROVIDER_SITE_OTHER)
Admission: RE | Admit: 2017-04-28 | Discharge: 2017-04-28 | Disposition: A | Discharge status: Home / Self Care | Payer: Medicare Other | Source: Ambulatory Visit | Attending: Family Medicine | Admitting: Family Medicine

## 2017-04-28 ENCOUNTER — Encounter: Payer: Self-pay | Admitting: Family Medicine

## 2017-04-28 VITALS — BP 164/96 | HR 90 | Temp 97.4°F | Wt 205.0 lb

## 2017-04-28 DIAGNOSIS — R29898 Other symptoms and signs involving the musculoskeletal system: Secondary | ICD-10-CM

## 2017-04-28 DIAGNOSIS — A419 Sepsis, unspecified organism: Secondary | ICD-10-CM | POA: Diagnosis not present

## 2017-04-28 DIAGNOSIS — M25562 Pain in left knee: Secondary | ICD-10-CM | POA: Diagnosis not present

## 2017-04-28 DIAGNOSIS — G8929 Other chronic pain: Secondary | ICD-10-CM | POA: Diagnosis not present

## 2017-04-28 DIAGNOSIS — N390 Urinary tract infection, site not specified: Secondary | ICD-10-CM

## 2017-04-28 DIAGNOSIS — E7800 Pure hypercholesterolemia, unspecified: Secondary | ICD-10-CM | POA: Diagnosis not present

## 2017-04-28 DIAGNOSIS — Z993 Dependence on wheelchair: Secondary | ICD-10-CM | POA: Diagnosis not present

## 2017-04-28 DIAGNOSIS — R319 Hematuria, unspecified: Secondary | ICD-10-CM | POA: Diagnosis not present

## 2017-04-28 DIAGNOSIS — F0280 Dementia in other diseases classified elsewhere without behavioral disturbance: Secondary | ICD-10-CM | POA: Diagnosis not present

## 2017-04-28 DIAGNOSIS — G309 Alzheimer's disease, unspecified: Secondary | ICD-10-CM | POA: Diagnosis not present

## 2017-04-28 DIAGNOSIS — R531 Weakness: Secondary | ICD-10-CM

## 2017-04-28 DIAGNOSIS — E1140 Type 2 diabetes mellitus with diabetic neuropathy, unspecified: Secondary | ICD-10-CM

## 2017-04-28 DIAGNOSIS — Z853 Personal history of malignant neoplasm of breast: Secondary | ICD-10-CM | POA: Diagnosis not present

## 2017-04-28 DIAGNOSIS — R32 Unspecified urinary incontinence: Secondary | ICD-10-CM | POA: Diagnosis not present

## 2017-04-28 DIAGNOSIS — I10 Essential (primary) hypertension: Secondary | ICD-10-CM | POA: Diagnosis not present

## 2017-04-28 DIAGNOSIS — Z87440 Personal history of urinary (tract) infections: Secondary | ICD-10-CM | POA: Diagnosis not present

## 2017-04-28 DIAGNOSIS — E785 Hyperlipidemia, unspecified: Secondary | ICD-10-CM | POA: Diagnosis not present

## 2017-04-28 DIAGNOSIS — M1712 Unilateral primary osteoarthritis, left knee: Secondary | ICD-10-CM | POA: Diagnosis not present

## 2017-04-28 LAB — COMPREHENSIVE METABOLIC PANEL
ALK PHOS: 94 U/L (ref 39–117)
ALT: 17 U/L (ref 0–35)
AST: 14 U/L (ref 0–37)
Albumin: 3.9 g/dL (ref 3.5–5.2)
BUN: 19 mg/dL (ref 6–23)
CHLORIDE: 98 meq/L (ref 96–112)
CO2: 31 mEq/L (ref 19–32)
Calcium: 9.7 mg/dL (ref 8.4–10.5)
Creatinine, Ser: 0.8 mg/dL (ref 0.40–1.20)
GFR: 73.09 mL/min (ref >60.00)
GLUCOSE: 121 mg/dL — AB (ref 70–99)
POTASSIUM: 4.3 meq/L (ref 3.5–5.1)
SODIUM: 135 meq/L (ref 135–145)
TOTAL PROTEIN: 7.2 g/dL (ref 6.0–8.3)
Total Bilirubin: 0.4 mg/dL (ref 0.2–1.2)

## 2017-04-28 LAB — LIPID PANEL
Cholesterol: 139 mg/dL (ref 0–200)
HDL: 40.2 mg/dL (ref >39.00)
NonHDL: 98.33
Total CHOL/HDL Ratio: 3
Triglycerides: 237 mg/dL — ABNORMAL HIGH (ref 0.0–149.0)
VLDL: 47.4 mg/dL — AB (ref 0.0–40.0)

## 2017-04-28 LAB — LDL CHOLESTEROL, DIRECT: LDL DIRECT: 76 mg/dL

## 2017-04-28 LAB — HEMOGLOBIN A1C: HEMOGLOBIN A1C: 6.8 % — AB (ref 4.6–6.5)

## 2017-04-28 NOTE — Assessment & Plan Note (Signed)
Due for re-eval with labs today.

## 2017-04-28 NOTE — Progress Notes (Signed)
Subjective:    Patient ID: [name] [name], female    DOB: 27-Apr-1933, 84 y.o.   MRN: [id number]  HPI   84 year old female presents for follow up.   She was hospitalized in 03/11/2017 for sepsis from UTI.   Summary copied below: UTIand sepsis: -Cultures negative so far. Hemodynamically improved.  -Sepsis has resolved  -Started on Rocephin. Switch to Ceftin for 3 more days on discharge.   Acute metabolic encephalopathy: -possiblydue to sepsis -Mental status has much improved and is almost at baseline as per the family members. -CT-head 03/12/17: Volume loss and chronic microvascular disease without acute intracranial abnormality  Leukocytosis -Resolved  Hyperlipidemia: -ContinueLipitor  Alzheimer's dementia without behavioral disturbance -continuegalantamine -Fall precautions -Monitor mental status    Today family is with the patient. They report she has been very weak since  Home from hospital and rehab.  Discharged about 3 week ago. She refused to cooperate with PT at the SNF. Has completed home health at home but has not been able to walk now.  She refuses to walk.  She has a lot of pain in her right knee. Pain with bending knee. Using tylenol arthritis,topical treatment.   Her memory has worsened significantly since UTI.   Son and granddaughter plans to care for her a t home.  Has lift chair and hospital bed.   Family needs help with transfers.  PT has recommended this as well... See scanned note.  Will take meds if crushed in ice cream.  Cannot crush diclofenac.. ? Continue or stop.. Not sure   BPs at home  Has not had meds yet today.  At home has been 117/60 BP Readings from Last 3 Encounters:  04/28/17 (!) 164/96  03/15/17 (!) 183/110  10/14/16 120/80    Review of Systems  Constitutional: Negative for fatigue and fever.  HENT: Negative for congestion.   Eyes: Negative for pain.  Respiratory: Negative for cough and shortness of breath.     Cardiovascular: Negative for chest pain, palpitations and leg swelling.  Gastrointestinal: Negative for abdominal pain.  Genitourinary: Negative for dysuria and vaginal bleeding.  Musculoskeletal: Positive for arthralgias. Negative for back pain.       Leg weakness, refusing to walk  Neurological: Positive for weakness. Negative for syncope, light-headedness and headaches.  Psychiatric/Behavioral: Positive for agitation and confusion. Negative for dysphoric mood.       Objective:   Physical Exam  Constitutional: Vital signs are normal. She appears well-developed and well-nourished. She is cooperative.  Non-toxic appearance. She does not appear ill. No distress.  Elderly female in NAD  central obesity  HENT:  Head: Normocephalic.  Right Ear: Hearing, tympanic membrane, external ear and ear canal normal. Tympanic membrane is not erythematous, not retracted and not bulging.  Left Ear: Hearing, tympanic membrane, external ear and ear canal normal. Tympanic membrane is not erythematous, not retracted and not bulging.  Nose: No mucosal edema or rhinorrhea. Right sinus exhibits no maxillary sinus tenderness and no frontal sinus tenderness. Left sinus exhibits no maxillary sinus tenderness and no frontal sinus tenderness.  Mouth/Throat: Uvula is midline, oropharynx is clear and moist and mucous membranes are normal.  Eyes: Conjunctivae, EOM and lids are normal. Pupils are equal, round, and reactive to light. Lids are everted and swept, no foreign bodies found.  Neck: Trachea normal and normal range of motion. Neck supple. Carotid bruit is not present. No thyroid mass and no thyromegaly present.  Cardiovascular: Normal rate, regular rhythm, S1 normal,  S2 normal, normal heart sounds, intact distal pulses and normal pulses. Exam reveals no gallop and no friction rub.  No murmur heard. Mild bilateral edmea peripherally  Pulmonary/Chest: Effort normal and breath sounds normal. No tachypnea. No  respiratory distress. She has no decreased breath sounds. She has no wheezes. She has no rhonchi. She has no rales.  Abdominal: Soft. Normal appearance and bowel sounds are normal. There is no tenderness.  Musculoskeletal:       Right knee: She exhibits decreased range of motion, swelling and deformity. She exhibits no effusion.       Left knee: She exhibits decreased range of motion, swelling and deformity. She exhibits no effusion, no bony tenderness, normal meniscus and no MCL laxity. Tenderness found. Medial joint line and lateral joint line tenderness noted. No MCL, no LCL and no patellar tendon tenderness noted.   Unable to stand..  Unclear if uncooperative or  Cannot support weight  Neurological: She is alert. No cranial nerve deficit or sensory deficit. She exhibits abnormal muscle tone.  Oriented x 1.. Person not place and time  unable to stand to assess coordination and gait  4/5 strength in lower  And upper extremeties bialterally  Skin: Skin is warm, dry and intact. No rash noted.  Many SK on body.    Psychiatric: Her behavior is normal. Thought content normal. Her mood appears not anxious. Her speech is tangential. She is not withdrawn. Cognition and memory are impaired. She expresses impulsivity and inappropriate judgment. She does not exhibit a depressed mood. She expresses no homicidal and no suicidal ideation. She expresses no suicidal plans and no homicidal plans. She is noncommunicative. She exhibits abnormal recent memory and abnormal remote memory.   Progression now to nonsensical speech          Assessment & Plan:

## 2017-04-28 NOTE — Patient Instructions (Addendum)
Stop atorvastatin.  Stop diclofenac.  Please stop at the lab to have labs drawn.  We will call with X-ray results.  We will start process for [facility].

## 2017-04-28 NOTE — Assessment & Plan Note (Signed)
Resolved S/P antibiotics. 

## 2017-04-28 NOTE — Assessment & Plan Note (Signed)
Pain in knee may be contributing to patients unwillingness to stand. Will eval  With X-ray and consider treating with topical diclofenac as unable to crush oral NSAID.  If not helping consider steroid injection if X-ray consistent with OA.

## 2017-04-28 NOTE — Assessment & Plan Note (Signed)
Discussed risk/ benefit of statin. Will stop at this time.

## 2017-04-28 NOTE — Assessment & Plan Note (Addendum)
Progressively worsening since hospitalization for sepsis.  Pt now uncooperative and weak. Refusing to walk and take pills unless in ice cream.  Discussed referral to palliative care or hospice family will discuss. They wish to keep her at home as long as possible.

## 2017-04-28 NOTE — Assessment & Plan Note (Signed)
Pt refuses to put weight on legs in office today given dementia. Difficult to assess strength and balance given pt uncooperativeness.  Family definitely would benefit from a HOyer lift to transfer pt to chair, to help her sit up as cannot do this without support and  assist with ADLs espcially diaper change.   She is currently getting PT but per current home health PT.Marland Kitchen She is full assist at this time. See scanned note from Richardson Chiquito , PT  I agree  A Hoyer lift is medically necessary for this pt to prevent decubitus ulcers and decrease risk of falls.

## 2017-04-28 NOTE — Assessment & Plan Note (Signed)
Resolved. No current symptoms.

## 2017-05-01 ENCOUNTER — Other Ambulatory Visit: Payer: Self-pay | Admitting: Family Medicine

## 2017-05-01 MED ORDER — DICLOFENAC SODIUM 1 % TD GEL: 4.0000 g | Freq: Four times a day (QID) | TRANSDERMAL | 3 refills | Status: DC

## 2017-05-02 ENCOUNTER — Telehealth: Payer: Self-pay | Admitting: *Deleted

## 2017-05-02 NOTE — Telephone Encounter (Signed)
Diclofenac Gel approved through 02/13/2018.  [facility] notified of approval via fax.

## 2017-05-02 NOTE — Telephone Encounter (Signed)
Received fax from [facility] requesting PA for Diclofenac Gel.  PA completed on CoverMyMeds.   PA sent for review.  Can take up to 72 hours for a response.

## 2017-05-03 ENCOUNTER — Ambulatory Visit (INDEPENDENT_AMBULATORY_CARE_PROVIDER_SITE_OTHER): Payer: Medicare Other | Admitting: Family Medicine

## 2017-05-03 ENCOUNTER — Encounter: Payer: Self-pay | Admitting: Family Medicine

## 2017-05-03 ENCOUNTER — Other Ambulatory Visit: Payer: Self-pay

## 2017-05-03 VITALS — BP 110/70 | HR 84 | Temp 97.9°F | Ht 64.5 in

## 2017-05-03 DIAGNOSIS — F0280 Dementia in other diseases classified elsewhere without behavioral disturbance: Secondary | ICD-10-CM | POA: Diagnosis not present

## 2017-05-03 DIAGNOSIS — M170 Bilateral primary osteoarthritis of knee: Secondary | ICD-10-CM

## 2017-05-03 DIAGNOSIS — Z993 Dependence on wheelchair: Secondary | ICD-10-CM | POA: Diagnosis not present

## 2017-05-03 DIAGNOSIS — R29898 Other symptoms and signs involving the musculoskeletal system: Secondary | ICD-10-CM

## 2017-05-03 DIAGNOSIS — G309 Alzheimer's disease, unspecified: Secondary | ICD-10-CM | POA: Diagnosis not present

## 2017-05-03 DIAGNOSIS — G301 Alzheimer's disease with late onset: Secondary | ICD-10-CM

## 2017-05-03 MED ORDER — METHYLPREDNISOLONE ACETATE 40 MG/ML IJ SUSP
80.0000 mg | Freq: Once | INTRAMUSCULAR | Status: AC
  Administered 2017-05-03: 80 mg via INTRA_ARTICULAR

## 2017-05-03 NOTE — Progress Notes (Signed)
Dr. Frederico Hamman T. Kamareon Sciandra, MD, [facility] [place], [address] Phone: [phone] Fax: [phone]  05/03/2017  Patient: [name] [name], MRN: [id number], DOB: November 20, 1933, 84 y.o.  Primary Physician:  Jinny Sanders, MD   Chief Complaint  Patient presents with  . Knee Pain    Bilateral-Injections   Subjective:   [name] [name] is a 84 y.o. very pleasant female patient who presents with the following:  B knee OA: Pleasant elderly lady who I remember from prior encounters who is taken a downturn in the last few months after hospitalization for urosepsis.  She is now in a wheelchair, she is having pain in her knees at baseline.  She does have known advanced osteoarthritis of both knees.  At this point she is really not able to get out of her wheelchair, but she is having pain on a daily basis, and she is sent here by my partner to see if corticosteroid injection might help her with relief of some symptoms.  She has had a good response before, but this was last done several years ago.  Past Medical History, Surgical History, Social History, Family History, Problem List, Medications, and Allergies have been reviewed and updated if relevant.  Patient Active Problem List   Diagnosis Date Noted  . Weakness 04/28/2017  . Chronic pain of left knee 04/28/2017  . Lower extremity weakness 04/28/2017  . Hyperkalemia 03/12/2017  . UTI (urinary tract infection) 03/12/2017  . Sepsis ([place])   . Insomnia 12/25/2015  . Counseling regarding end of life decision making 10/03/2014  . Controlled type 2 diabetes mellitus with diabetic neuropathy ([place]) 10/03/2014  . Bowel incontinence 01/30/2014  . Chronic venous insufficiency 08/15/2013  . Peripheral edema 07/30/2013  . Vitamin D deficiency 02/13/2013  . Neuropathy due to type 2 diabetes mellitus ([place]) 05/17/2011  . Uterine prolapse 11/29/2010  . Urine incontinence 11/29/2010  . Alzheimer's  dementia without behavioral disturbance, severe, 05/2016 12/30 07/05/2010  . Breast cancer in situ 07/30/2008    Class: Diagnosis of  . Ductal carcinoma in situ (DCIS) of left breast 07/11/2008  . SEBORRHEIC KERATOSIS 06/20/2007  . HYPERCHOLESTEROLEMIA 05/12/2006  . Essential hypertension, benign 05/12/2006  . DIVERTICULOSIS, COLON 05/12/2006  . VAGINITIS, ATROPHIC 05/12/2006  . Osteopenia 05/12/2006  . TREMOR, ESSENTIAL 09/20/2005  . SYNCOPE, HX OF 09/20/2005    Past Medical History:  Diagnosis Date  . Alzheimer's dementia   . Breast cancer, left breast ([place]) 2007  . Diverticulosis   . HTN (hypertension)   . Hyperlipidemia   . Osteopenia   . Pneumonia X 1    Past Surgical History:  Procedure Laterality Date  . BREAST LUMPECTOMY Left 2007  . CATARACT EXTRACTION W/ INTRAOCULAR LENS IMPLANT Right     Social History   Socioeconomic History  . Marital status: Married    Spouse name: Not on file  . Number of children: Not on file  . Years of education: Not on file  . Highest education level: Not on file  Social Needs  . Financial resource strain: Not on file  . Food insecurity - worry: Not on file  . Food insecurity - inability: Not on file  . Transportation needs - medical: Not on file  . Transportation needs - non-medical: Not on file  Occupational History  . Not on file  Tobacco Use  . Smoking status: Former Smoker    Last attempt to quit: 02/14/1957  Years since quitting: 60.2  . Smokeless tobacco: Never Used  Substance and Sexual Activity  . Alcohol use: No  . Drug use: No  . Sexual activity: Not Currently    Birth control/protection: Post-menopausal  Other Topics Concern  . Not on file  Social History Narrative  . Not on file    Family History  Problem Relation Age of Onset  . Cancer Brother        Prostate  . Alzheimer's disease Mother   . Stroke Father   . Aneurysm Father   . Colon cancer Neg Hx     Allergies  Allergen Reactions  .  Sulfonamide Derivatives     REACTION: rash  . Sulfur     hives    Medication list reviewed and updated in full in [place].  GEN: No fevers, chills. Nontoxic. Primarily MSK c/o today. MSK: Detailed in the HPI GI: tolerating PO intake without difficulty Neuro: No numbness, parasthesias, or tingling associated. Otherwise the pertinent positives of the ROS are noted above.   Objective:   BP 110/70   Pulse 84   Temp 97.9 F (36.6 C) (Oral)   Ht 5' 4.5" (1.638 m)   BMI 34.64 kg/m    GEN: WDWN, NAD, Non-toxic, Alert & Oriented x 3 HEENT: Atraumatic, Normocephalic.  Ears and Nose: No external deformity. EXTR: No clubbing/cyanosis/edema NEURO: wheelchair PSYCH: flat affect  Patient was examined in the wheelchair.  She has a loss of approximately 15 degrees of extension on the left knee.  She has a loss of approximately 7-8 degrees on the right knee of extension.  On the left, the patient is able to flex her knee to approximately 90 degrees.  On the right this is approximately 205 degrees.  She has pain on the medial joint lines bilaterally as well as to a lesser extent the lateral joint lines.  There is significant crepitus with movement.  Her exam is guarded and moderately difficult to get the patient to relax.  She does have some pain with flexion pinch as well.  She is stable to varus and valgus stress.  Radiology: Dg Knee Complete 4 Views Left  Result Date: 04/28/2017 CLINICAL DATA:  Chronic left knee pain without known injury. EXAM: LEFT KNEE - COMPLETE 4+ VIEW COMPARISON:  Radiographs of Jun 17, 2013. FINDINGS: No evidence of fracture, dislocation, or joint effusion. Severe narrowing of lateral joint space is noted with osteophyte formation. Moderate narrowing of patellofemoral space is noted with osteophyte formation. Soft tissues are unremarkable. IMPRESSION: Severe degenerative joint disease as described above. No acute abnormality seen in the left knee. Electronically  Signed   By: Marijo Conception, M.D.   On: 04/28/2017 15:52    Assessment and Plan:   Arthritis of both knees - Plan: methylPREDNISolone acetate (DEPO-MEDROL) injection 80 mg, methylPREDNISolone acetate (DEPO-MEDROL) injection 80 mg  Late onset Alzheimer's disease without behavioral disturbance  Weakness of both lower extremities  Wheelchair confinement status  The radiological images were independently reviewed by myself in the office and results were reviewed with the patient. My independent interpretation of images:   The patient has end-stage degenerative joint disease on the left knee radiographically and clinically.  Most recent right knee x-ray I have is from 2015, but the patient appears to be approaching end-stage arthritis in the right knee also.  Anything that we can do to get this patient some pain relief, and hopefully ultimately get her out of her wheelchair would be ideal.  I think that this is certainly reasonable to try to inject her knees to see if we can get her some comfort.  Knee Injection, RIGHT Patient verbally consented to procedure. Risks (including potential rare risk of infection), benefits, and alternatives explained. Sterilely prepped with Chloraprep. Ethyl cholride used for anesthesia. 8 cc Lidocaine 1% mixed with 2 mL Depo-Medrol 40 mg injected using the anteromedial approach without difficulty. No complications with procedure and tolerated well. Patient had decreased pain post-injection.   Knee Injection, LEFT Patient verbally consented to procedure. Risks (including potential rare risk of infection), benefits, and alternatives explained. Sterilely prepped with Chloraprep. Ethyl cholride used for anesthesia. 8 cc Lidocaine 1% mixed with 2 mL Depo-Medrol 40 mg injected using the anteromedial approach without difficulty. No complications with procedure and tolerated well. Patient had decreased pain post-injection.    Follow-up: No follow-ups on file.  Meds  ordered this encounter  Medications  . methylPREDNISolone acetate (DEPO-MEDROL) injection 80 mg  . methylPREDNISolone acetate (DEPO-MEDROL) injection 80 mg   Signed,  Nyzir Dubois T. Zaraya Delauder, MD   Allergies as of 05/03/2017      Reactions   Sulfonamide Derivatives    REACTION: rash   Sulfur    hives      Medication List        Accurate as of 05/03/17 11:59 PM. Always use your most recent med list.          atenolol 25 MG tablet Commonly known as:  TENORMIN TAKE 1 TABLET BY MOUTH DAILY   CALCIUM 600+D 600-200 MG-UNIT Tabs Generic drug:  Calcium Carbonate-Vitamin D Take 1 tablet by mouth 2 (two) times daily.   diclofenac sodium 1 % Gel Commonly known as:  VOLTAREN Apply 4 g topically 4 (four) times daily. To knee   galantamine 12 MG tablet Commonly known as:  RAZADYNE Take 1 tablet (12 mg total) by mouth 2 (two) times daily.   lisinopril 20 MG tablet Commonly known as:  PRINIVIL,ZESTRIL TAKE 1 TABLET BY MOUTH DAILY   thiamine 100 MG tablet Commonly known as:  VITAMIN B-1 Take 100 mg by mouth daily.

## 2017-05-10 ENCOUNTER — Telehealth: Payer: Self-pay | Admitting: Family Medicine

## 2017-05-10 ENCOUNTER — Other Ambulatory Visit (INDEPENDENT_AMBULATORY_CARE_PROVIDER_SITE_OTHER): Payer: Medicare Other

## 2017-05-10 DIAGNOSIS — R509 Fever, unspecified: Secondary | ICD-10-CM | POA: Diagnosis not present

## 2017-05-10 DIAGNOSIS — R109 Unspecified abdominal pain: Secondary | ICD-10-CM

## 2017-05-10 DIAGNOSIS — R300 Dysuria: Secondary | ICD-10-CM | POA: Diagnosis not present

## 2017-05-10 LAB — URINALYSIS, ROUTINE W REFLEX MICROSCOPIC
BILIRUBIN URINE: NEGATIVE
Hgb urine dipstick: NEGATIVE
KETONES UR: NEGATIVE
NITRITE: POSITIVE — AB
PH: 6.5 (ref 5.0–8.0)
RBC / HPF: NONE SEEN (ref 0–?)
Specific Gravity, Urine: 1.02 (ref 1.000–1.030)
Total Protein, Urine: NEGATIVE
URINE GLUCOSE: NEGATIVE
UROBILINOGEN UA: 0.2 (ref 0.0–1.0)

## 2017-05-10 NOTE — Telephone Encounter (Signed)
Copied from [place] [phone]. Topic: Quick Communication - See Telephone Encounter >> May 10, 2017  8:47 AM Bea Graff, NT wrote: CRM for notification. See Telephone encounter for: 05/10/17. Pts son, [name] [name] calling and states that he would like to come in and get a urine sample cup and test his moms urine due to a possible UTI. He states her urine has an "odor like it did last time" and that her Alzheimer's makes it hard to get her in the car and to the appointment. He states when she has a UTI she gets uncooperative. He wants to see if he can check it at home instead of coming in for an appointment. States last time they had to get an ambulance to come to take her to the hospital. Please call son at [phone]. This is his wifes mobile number, Caren Griffins who is also on the DPR.

## 2017-05-10 NOTE — Telephone Encounter (Addendum)
Spoke with Caren Griffins.  She states [name]'s urine just has a strong odor.   Denies fever and states [name] doesn't tell them if she is in any pain due to advanced dementia.  I advised that they could come by and pick up supplies to collect sample at home.  Instructions giving on how to collect a clean catch urine and advised sample needs to be return to office with in 2 hours of collection or it will need to be refrigerated.  Caren Griffins states understanding.  Supplies placed up at the front desk for family to pick up.

## 2017-05-10 NOTE — Telephone Encounter (Signed)
Please document and symptoms .Marland Kitchen Like dysuria, abd pain, increased frequency, urgency, fever, flank pain etc.  If fever or flank pain.. Needs to be seen. Otherwise given dementia.. We can have her bring in urine .Marland Kitchen Send  to [facility] for UA and culture as I am not in office to read.  Make sure he is aware of how to collect clean catch.

## 2017-05-12 ENCOUNTER — Telehealth: Payer: Self-pay | Admitting: Family Medicine

## 2017-05-12 LAB — URINE CULTURE
MICRO NUMBER:: 90382633
SPECIMEN QUALITY:: ADEQUATE

## 2017-05-12 MED ORDER — CEPHALEXIN 500 MG PO CAPS: 500.0000 mg | ORAL_CAPSULE | Freq: Three times a day (TID) | ORAL | 0 refills | Status: DC

## 2017-05-12 NOTE — Telephone Encounter (Signed)
Caren Griffins notified by telephone that Dr. Diona Browner has sent [name] [name] in an antibiotic(Keflex) for UTI to [facility] in [place].

## 2017-05-12 NOTE — Telephone Encounter (Signed)
Please see 05/10/17 phone note also.

## 2017-05-12 NOTE — Telephone Encounter (Signed)
Caren Griffins (daughter-in-law) called backed. States pt.'s only symptom has been the strong smelling urine and some increased confusion. Please notify them if medication is called in.

## 2017-05-12 NOTE — Telephone Encounter (Signed)
I will send in 7 day course of keflex.

## 2017-05-23 ENCOUNTER — Other Ambulatory Visit: Payer: Self-pay | Admitting: Family Medicine

## 2017-08-15 ENCOUNTER — Other Ambulatory Visit: Payer: Self-pay | Admitting: Family Medicine

## 2017-08-20 ENCOUNTER — Other Ambulatory Visit: Payer: Self-pay | Admitting: Family Medicine

## 2017-10-08 ENCOUNTER — Other Ambulatory Visit: Payer: Self-pay | Admitting: Family Medicine

## 2017-10-09 NOTE — Telephone Encounter (Signed)
Last office visit 05/03/2017 with Dr. Lorelei Pont for Arthritis both knees. CPE scheduled for 11/02/17.   Last refilled 05/01/2017 for 500 g with 3 refills.[place] to refill?

## 2017-10-19 ENCOUNTER — Encounter: Payer: Medicare Other | Admitting: Family Medicine

## 2017-11-02 ENCOUNTER — Ambulatory Visit (INDEPENDENT_AMBULATORY_CARE_PROVIDER_SITE_OTHER): Payer: Medicare Other | Admitting: Family Medicine

## 2017-11-02 ENCOUNTER — Encounter: Payer: Self-pay | Admitting: Family Medicine

## 2017-11-02 ENCOUNTER — Ambulatory Visit (INDEPENDENT_AMBULATORY_CARE_PROVIDER_SITE_OTHER): Payer: Medicare Other

## 2017-11-02 VITALS — BP 100/60 | HR 78 | Temp 97.5°F | Ht 65.0 in | Wt 205.5 lb

## 2017-11-02 DIAGNOSIS — E559 Vitamin D deficiency, unspecified: Secondary | ICD-10-CM

## 2017-11-02 DIAGNOSIS — E1140 Type 2 diabetes mellitus with diabetic neuropathy, unspecified: Secondary | ICD-10-CM

## 2017-11-02 DIAGNOSIS — I10 Essential (primary) hypertension: Secondary | ICD-10-CM

## 2017-11-02 DIAGNOSIS — F0280 Dementia in other diseases classified elsewhere without behavioral disturbance: Secondary | ICD-10-CM | POA: Diagnosis not present

## 2017-11-02 DIAGNOSIS — G301 Alzheimer's disease with late onset: Secondary | ICD-10-CM

## 2017-11-02 DIAGNOSIS — Z0000 Encounter for general adult medical examination without abnormal findings: Secondary | ICD-10-CM | POA: Diagnosis not present

## 2017-11-02 LAB — VITAMIN D 25 HYDROXY (VIT D DEFICIENCY, FRACTURES): VITD: 30.45 ng/mL (ref 30.00–100.00)

## 2017-11-02 LAB — HEMOGLOBIN A1C: HEMOGLOBIN A1C: 6.5 % (ref 4.6–6.5)

## 2017-11-02 LAB — HM DIABETES FOOT EXAM

## 2017-11-02 NOTE — Assessment & Plan Note (Signed)
Well controlled. Continue current medication.  

## 2017-11-02 NOTE — Assessment & Plan Note (Signed)
Stable on no med but contributes to imbalance.

## 2017-11-02 NOTE — Progress Notes (Signed)
Subjective:   [name] [name] is a 84 y.o. female who presents for Medicare Annual (Subsequent) preventive examination.  Review of Systems:  N/A Cardiac Risk Factors include: advanced age (>61men, >75 women);obesity (BMI >30kg/m2);diabetes mellitus;dyslipidemia;hypertension     Objective:     Vitals: BP 100/60 (BP Location: Right Arm, Patient Position: Sitting, Cuff Size: Normal)   Pulse 78   Temp (!) 97.5 F (36.4 C) (Oral)   Ht 5\' 5"  (1.651 m) Comment: shoes  Wt 205 lb 8 oz (93.2 kg)   SpO2 95%   BMI 34.20 kg/m   Body mass index is 34.2 kg/m.  Advanced Directives 11/02/2017 11/02/2017 03/13/2017 10/14/2016 09/24/2015 09/24/2015 01/02/2014  Does Patient Have a Medical Advance Directive? Yes Yes Yes Yes Yes Yes Yes  Type of Paramedic of [place];Living will [place];Living will [place];Living will [place];Living will [place];Living will [place];Living will -  Does patient want to make changes to medical advance directive? - - No - Patient declined - No - Patient declined No - Patient declined -  Copy of [place] in Chart? No - copy requested No - copy requested Yes No - copy requested Yes Yes No - copy requested    Tobacco Social History   Tobacco Use  Smoking Status Former Smoker  . Last attempt to quit: 02/14/1957  . Years since quitting: 60.7  Smokeless Tobacco Never Used     Counseling given: No   Clinical Intake:  Pre-visit preparation completed: Yes  Pain : No/denies pain Pain Score: 0-No pain     Nutritional Status: BMI > 30  Obese Nutritional Risks: None Diabetes: Yes CBG done?: No Did pt. bring in CBG monitor from home?: No  How often do you need to have someone help you when you read instructions, pamphlets, or other written materials from your doctor or pharmacy?: 4 - Often What is the last grade level you  completed in school?: 12th grade + some college  Interpreter Needed?: No  Comments: pt lives with spouse Information entered by :: LPinson, LPN  Past Medical History:  Diagnosis Date  . Alzheimer's dementia   . Breast cancer, left breast ([place]) 2007  . Diverticulosis   . HTN (hypertension)   . Hyperlipidemia   . Osteopenia   . Pneumonia X 1   Past Surgical History:  Procedure Laterality Date  . BREAST LUMPECTOMY Left 2007  . CATARACT EXTRACTION W/ INTRAOCULAR LENS IMPLANT Right    Family History  Problem Relation Age of Onset  . Cancer Brother        Prostate  . Alzheimer's disease Mother   . Stroke Father   . Aneurysm Father   . Colon cancer Neg Hx    Social History   Socioeconomic History  . Marital status: Married    Spouse name: Not on file  . Number of children: Not on file  . Years of education: Not on file  . Highest education level: Not on file  Occupational History  . Not on file  Social Needs  . Financial resource strain: Not on file  . Food insecurity:    Worry: Not on file    Inability: Not on file  . Transportation needs:    Medical: Not on file    Non-medical: Not on file  Tobacco Use  . Smoking status: Former Smoker    Last attempt to quit: 02/14/1957  Years since quitting: 60.7  . Smokeless tobacco: Never Used  Substance and Sexual Activity  . Alcohol use: No  . Drug use: No  . Sexual activity: Not Currently    Birth control/protection: Post-menopausal  Lifestyle  . Physical activity:    Days per week: Not on file    Minutes per session: Not on file  . Stress: Not on file  Relationships  . Social connections:    Talks on phone: Not on file    Gets together: Not on file    Attends religious service: Not on file    Active member of club or organization: Not on file    Attends meetings of clubs or organizations: Not on file    Relationship status: Not on file  Other Topics Concern  . Not on file  Social History Narrative  . Not on  file    Outpatient Encounter Medications as of 11/02/2017  Medication Sig  . atenolol (TENORMIN) 25 MG tablet TAKE 1 TABLET BY MOUTH DAILY  . AZO-CRANBERRY PO Take 2 tablets by mouth daily.  . B Complex Vitamins (VITAMIN B-COMPLEX PO) Take 1 tablet by mouth daily.  . Calcium Carbonate-Vitamin D (CALCIUM 600+D) 600-200 MG-UNIT TABS Take 1 tablet by mouth 2 (two) times daily.   . diclofenac sodium (VOLTAREN) 1 % GEL APPLY 4 G TOPICALLY 4 (FOUR) TIMES DAILY. TO KNEE  . galantamine (RAZADYNE) 12 MG tablet TAKE 1 TABLET BY MOUTH TWICE A DAY  . lisinopril (PRINIVIL,ZESTRIL) 20 MG tablet TAKE 1 TABLET BY MOUTH DAILY  . TURMERIC PO Take 1 tablet by mouth daily.  . [DISCONTINUED] cephALEXin (KEFLEX) 500 MG capsule Take 1 capsule (500 mg total) by mouth 3 (three) times daily.  . [DISCONTINUED] thiamine (VITAMIN B-1) 100 MG tablet Take 100 mg by mouth daily.   No facility-administered encounter medications on file as of 11/02/2017.     Activities of Daily Living In your present state of health, do you have any difficulty performing the following activities: 11/02/2017 11/02/2017  Hearing? N N  Vision? N N  Difficulty concentrating or making decisions? [name]  Walking or climbing stairs? Y Y  Dressing or bathing? Y Y  Doing errands, shopping? [name]  Preparing Food and eating ? Y Y  Using the Toilet? Y Y  In the past six months, have you accidently leaked urine? Y Y  Do you have problems with loss of bowel control? Y Y  Managing your Medications? Y Y  Managing your Finances? [name]  Housekeeping or managing your Housekeeping? [name]  Some recent data might be hidden    Patient Care Team: Jinny Sanders, MD as PCP - General    Assessment:   This is a routine wellness examination for [name].   Visual Acuity Screening   Right eye Left eye Both eyes  Without correction:     With correction: 20/200 20/50 20/50-1    Exercise Activities and Dietary recommendations Current Exercise Habits: The patient  does not participate in regular exercise at present, Exercise limited by: orthopedic condition(s)  Goals    . Increase water intake     Starting 11/02/2017, I will attempt to drink at least 3-4 glasses of water daily.       Fall Risk Fall Risk  11/02/2017 11/02/2017 10/14/2016 09/24/2015 09/24/2015  Falls in the past year? Yes Yes No Yes Yes  Comment unwitnessed fall in kitchen; laceration to left elbow unwitnessed fall in kitchen; laceration to left elbow - - -  Number falls in past yr: 1 1 - 1 1  Injury with Fall? Yes Yes - No No  Risk for fall due to : Impaired balance/gait;Impaired mobility;Mental status change Impaired balance/gait;Impaired mobility;Mental status change - History of fall(s);Impaired balance/gait;Impaired mobility;Mental status change History of fall(s);Impaired balance/gait;Impaired mobility;Mental status change  Follow up - - - Falls evaluation completed;Education provided;Falls prevention discussed Falls evaluation completed;Education provided;Falls prevention discussed   Depression Screen PHQ 2/9 Scores 11/02/2017 11/02/2017 10/14/2016 09/24/2015  PHQ - 2 Score 0 0 0 1  PHQ- 9 Score 0 0 0 -     Cognitive Function MMSE - Mini Mental State Exam 11/02/2017 11/02/2017 10/14/2016 09/24/2015  Not completed: (No Data) (No Data) (No Data) Unable to complete    DX: Alzheimer's disease. Mini-Cog not completed.     Immunization History  Administered Date(s) Administered  . PPD Test 03/13/2015  . Pneumococcal Conjugate-13 10/03/2014  . Pneumococcal Polysaccharide-23 01/24/2008  . Td 11/27/1997, 01/24/2008    Screening Tests Health Maintenance  Topic Date Due  . INFLUENZA VACCINE  09/23/2028 (Originally 09/14/2017)  . OPHTHALMOLOGY EXAM  09/23/2028 (Originally 11/11/1943)  . TETANUS/TDAP  01/23/2018  . HEMOGLOBIN A1C  05/03/2018  . FOOT EXAM  11/03/2018  . DEXA SCAN  Completed  . PNA vac Low Risk Adult  Completed     Plan:     I have personally reviewed, addressed, and  noted the following in the patient's chart:  A. Medical and social history B. Use of alcohol, tobacco or illicit drugs  C. Current medications and supplements D. Functional ability and status E.  Nutritional status F.  Physical activity G. Advance directives H. List of other physicians I.  Hospitalizations, surgeries, and ER visits in previous 12 months J.  [place] to include hearing, vision, cognitive, depression L. Referrals and appointments - none  In addition, I have reviewed and discussed with patient certain preventive protocols, quality metrics, and best practice recommendations. A written personalized care plan for preventive services as well as general preventive health recommendations were provided to patient.  See attached scanned questionnaire for additional information.   Signed,   Lindell Noe, MHA, BS, LPN Health Coach

## 2017-11-02 NOTE — Assessment & Plan Note (Addendum)
Has stabilized out  On galantamine and turmeric.

## 2017-11-02 NOTE — Patient Instructions (Signed)
[name] [name] , Thank you for taking time to come for your Medicare Wellness Visit. I appreciate your ongoing commitment to your health goals. Please review the following plan we discussed and let me know if I can assist you in the future.   These are the goals we discussed: Goals    . Increase water intake     Starting 11/02/2017, I will attempt to drink at least 3-4 glasses of water daily.       This is a list of the screening recommended for you and due dates:  Health Maintenance  Topic Date Due  . Flu Shot  09/23/2028*  . Eye exam for diabetics  09/23/2028*  . Tetanus Vaccine  01/23/2018  . Hemoglobin A1C  05/03/2018  . Complete foot exam   11/03/2018  . DEXA scan (bone density measurement)  Completed  . Pneumonia vaccines  Completed  *Topic was postponed. The date shown is not the original due date.   Preventive Care for Adults  A healthy lifestyle and preventive care can promote health and wellness. Preventive health guidelines for adults include the following key practices.  . A routine yearly physical is a good way to check with your health care provider about your health and preventive screening. It is a chance to share any concerns and updates on your health and to receive a thorough exam.  . Visit your dentist for a routine exam and preventive care every 6 months. Brush your teeth twice a day and floss once a day. Good oral hygiene prevents tooth decay and gum disease.  . The frequency of eye exams is based on your age, health, family medical history, use  of contact lenses, and other factors. Follow your health care provider's recommendations for frequency of eye exams.  . Eat a healthy diet. Foods like vegetables, fruits, whole grains, low-fat dairy products, and lean protein foods contain the nutrients you need without too many calories. Decrease your intake of foods high in solid fats, added sugars, and salt. Eat the right amount of calories for you. Get information about  a proper diet from your health care provider, if necessary.  . Regular physical exercise is one of the most important things you can do for your health. Most adults should get at least 150 minutes of moderate-intensity exercise (any activity that increases your heart rate and causes you to sweat) each week. In addition, most adults need muscle-strengthening exercises on 2 or more days a week.  Silver Sneakers may be a benefit available to you. To determine eligibility, you may visit the website: www.silversneakers.com or contact program at 903-862-6575 Mon-Fri between 8AM-8PM.   . Maintain a healthy weight. The body mass index (BMI) is a screening tool to identify possible weight problems. It provides an estimate of body fat based on height and weight. Your health care provider can find your BMI and can help you achieve or maintain a healthy weight.   For adults 20 years and older: ? A BMI below 18.5 is considered underweight. ? A BMI of 18.5 to 24.9 is normal. ? A BMI of 25 to 29.9 is considered overweight. ? A BMI of 30 and above is considered obese.   . Maintain normal blood lipids and cholesterol levels by exercising and minimizing your intake of saturated fat. Eat a balanced diet with plenty of fruit and vegetables. Blood tests for lipids and cholesterol should begin at age 57 and be repeated every 5 years. If your lipid or cholesterol levels  are high, you are over 50, or you are at high risk for heart disease, you may need your cholesterol levels checked more frequently. Ongoing high lipid and cholesterol levels should be treated with medicines if diet and exercise are not working.  . If you smoke, find out from your health care provider how to quit. If you do not use tobacco, please do not start.  . If you choose to drink alcohol, please do not consume more than 2 drinks per day. One drink is considered to be 12 ounces (355 mL) of beer, 5 ounces (148 mL) of wine, or 1.5 ounces (44 mL) of  liquor.  . If you are 57-83 years old, ask your health care provider if you should take aspirin to prevent strokes.  . Use sunscreen. Apply sunscreen liberally and repeatedly throughout the day. You should seek shade when your shadow is shorter than you. Protect yourself by wearing long sleeves, pants, a wide-brimmed hat, and sunglasses year round, whenever you are outdoors.  . Once a month, do a whole body skin exam, using a mirror to look at the skin on your back. Tell your health care provider of new moles, moles that have irregular borders, moles that are larger than a pencil eraser, or moles that have changed in shape or color.

## 2017-11-02 NOTE — Progress Notes (Signed)
I reviewed health advisor's note, was available for consultation, and agree with documentation and plan.  

## 2017-11-02 NOTE — Progress Notes (Signed)
PCP notes:   Health maintenance:  A1C - completed Foot exam - PCP follow-up needed  Abnormal screenings:   Fall risk - hx of single fall Fall Risk  11/02/2017 11/02/2017 10/14/2016 09/24/2015 09/24/2015  Falls in the past year? Yes Yes No Yes Yes  Comment unwitnessed fall in kitchen; laceration to left elbow unwitnessed fall in kitchen; laceration to left elbow - - -  Number falls in past yr: 1 1 - 1 1  Injury with Fall? Yes Yes - No No  Risk for fall due to : Impaired balance/gait;Impaired mobility;Mental status change Impaired balance/gait;Impaired mobility;Mental status change - History of fall(s);Impaired balance/gait;Impaired mobility;Mental status change History of fall(s);Impaired balance/gait;Impaired mobility;Mental status change  Follow up - - - Falls evaluation completed;Education provided;Falls prevention discussed Falls evaluation completed;Education provided;Falls prevention discussed    Patient concerns:   None  Nurse concerns:  None  Next PCP appt:   11/02/17 @ 1400

## 2017-11-02 NOTE — Patient Instructions (Addendum)
Consider repeat knee steroid injection for OA with Dr. Lorelei Pont.  Discuss bone density given high risk for fall.

## 2017-11-02 NOTE — Assessment & Plan Note (Signed)
Due for re-eval. 

## 2017-11-02 NOTE — Progress Notes (Signed)
Subjective:    Patient ID: [name] [name], female    DOB: Feb 20, 1933, 84 y.o.   MRN: [id number]  HPI The patient presents for complete physical and review of chronic health problems.   The patient saw Candis Musa, LPN for medicare wellness. Note reviewed in detail.   Alzheimer's dementia:  Stable on galantamine per daughter.  She feels like she is much better on turmeric. She is wlaking better now after knee steroids and diclofenac gel. She is somewhat less confused than at last OV.   Fairly good appetite. Using melatonin at bedtime. Using hospital bed. She did fall in the last year.  Urinary incontinence at night, small amount of bowel incontinence. Able to hold stool until AM mostly.  OA diclofenac gel helping with knee pain.  Hypertension: Good control on current regimen with lisinopril and atenolol. Using medication without problems or lightheadedness: none Chest pain with exertion:none Edema:none Short of breath:none Average home BPs: Other issues:  Diabetes:   Due for re-eval. 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    Wt Readings from Last 3 Encounters:  11/02/17 205 lb 8 oz (93.2 kg)  11/02/17 205 lb 8 oz (93.2 kg)  04/28/17 205 lb (93 kg)    Social History /Family History/Past Medical History reviewed in detail and updated in EMR if needed.  Blood pressure 100/60, pulse 78, temperature (!) 97.5 F (36.4 C), temperature source Oral, height 5\' 5"  (1.651 m), weight 205 lb 8 oz (93.2 kg), SpO2 95 %.  Review of Systems  Constitutional: Negative for fatigue and fever.  HENT: Negative for congestion.   Eyes: Negative for pain.  Respiratory: Negative for cough and shortness of breath.     Cardiovascular: Negative for chest pain, palpitations and leg swelling.  Gastrointestinal: Negative for abdominal pain.  Genitourinary: Negative for dysuria and vaginal bleeding.  Musculoskeletal: Negative for back pain.  Neurological: Negative for syncope, light-headedness and headaches.  Psychiatric/Behavioral: Negative for dysphoric mood.       Objective:   Physical Exam  Constitutional: Vital signs are normal. She appears well-developed and well-nourished. She is cooperative.  Non-toxic appearance. She does not appear ill. No distress.  Elderly female in NAD  central obesity  HENT:  Head: Normocephalic.  Right Ear: Hearing, tympanic membrane, external ear and ear canal normal. Tympanic membrane is not erythematous, not retracted and not bulging.  Left Ear: Hearing, tympanic membrane, external ear and ear canal normal. Tympanic membrane is not erythematous, not retracted and not bulging.  Nose: No mucosal edema or rhinorrhea. Right sinus exhibits no maxillary sinus tenderness and no frontal sinus tenderness. Left sinus exhibits no maxillary sinus tenderness and no frontal sinus tenderness.  Mouth/Throat: Uvula is midline, oropharynx is clear and moist and mucous membranes are normal.  Eyes: Pupils are equal, round, and reactive to light. Conjunctivae, EOM and lids are normal. Lids are everted and swept, no foreign bodies found.  Neck: Trachea normal and normal range of motion. Neck supple. Carotid bruit is not present. No thyroid mass and no thyromegaly present.  Cardiovascular: Normal rate, regular rhythm, S1 normal, S2 normal, normal heart sounds, intact distal pulses and normal pulses. Exam reveals no gallop and no friction rub.  No murmur heard. Mild bilateral  edmea peripherally  Pulmonary/Chest: Effort normal and breath sounds normal. No tachypnea. No respiratory distress. She has no decreased breath sounds. She has no wheezes. She has no rhonchi. She has no rales.  Abdominal:  Soft. Normal appearance and bowel sounds are normal. There is no tenderness.  Musculoskeletal:       Right knee: She exhibits decreased range of motion. She exhibits no swelling, no effusion and no deformity. Tenderness found.       Left knee: She exhibits decreased range of motion. She exhibits no swelling, no effusion, no deformity, no bony tenderness, normal meniscus and no MCL laxity. Tenderness found. No medial joint line, no lateral joint line, no MCL, no LCL and no patellar tendon tenderness noted.   Unable to stand..  Unclear if uncooperative or  Cannot support weight  Neurological: She is alert. No cranial nerve deficit or sensory deficit. She exhibits abnormal muscle tone.  Oriented x 1.. Person not place and time  Now using walker.. Much stronger than last OV  4/5 strength in lower  And upper extremeties bialterally  Skin: Skin is warm, dry and intact. No rash noted.  Many SK on body.    Psychiatric: Her behavior is normal. Thought content normal. Her mood appears not anxious. Her speech is tangential. She is not withdrawn. Cognition and memory are impaired. She expresses impulsivity and inappropriate judgment. She does not exhibit a depressed mood. She expresses no homicidal and no suicidal ideation. She expresses no suicidal plans and no homicidal plans. She is noncommunicative. She exhibits abnormal recent memory and abnormal remote memory.   Progression now to nonsensical speech     Diabetic foot exam: Normal inspection No skin breakdown No calluses  Normal DP pulses Normal sensation to light touch and monofilament Nails normal      Assessment & Plan:  The patient's preventative maintenance and recommended screening tests for an annual wellness exam were reviewed in full today. Brought up to date unless services declined.  Reviewed health maintenance.. Addressed necessary items.  Discussed vaccines.. Family deferred for pt.  DEXA: family will consider.

## 2017-11-06 ENCOUNTER — Encounter: Payer: Self-pay | Admitting: Family Medicine

## 2017-11-06 ENCOUNTER — Ambulatory Visit (INDEPENDENT_AMBULATORY_CARE_PROVIDER_SITE_OTHER): Payer: Medicare Other | Admitting: Family Medicine

## 2017-11-06 VITALS — BP 120/66 | HR 74 | Temp 98.3°F | Ht 65.0 in | Wt 199.5 lb

## 2017-11-06 DIAGNOSIS — M170 Bilateral primary osteoarthritis of knee: Secondary | ICD-10-CM

## 2017-11-06 MED ORDER — METHYLPREDNISOLONE ACETATE 40 MG/ML IJ SUSP
80.0000 mg | Freq: Once | INTRAMUSCULAR | Status: AC
  Administered 2017-11-06: 80 mg via INTRA_ARTICULAR

## 2017-11-06 NOTE — Progress Notes (Signed)
   Dr. Frederico Hamman T. Damar Petit, MD, [facility] [place], [address] Phone: [phone] Fax: [phone]  11/06/2017  Patient: [name] [name], MRN: [id number], DOB: 1933/06/02, 84 y.o.  Primary Physician:  Jinny Sanders, MD   Chief Complaint  Patient presents with  . Knee Pain    Bilateral Knee Injections    Knee Injection, RIGHT Date of procedure: 11/06/2017 Patient verbally consented to procedure. Risks (including potential rare risk of infection), benefits, and alternatives explained. Sterilely prepped with Chloraprep. Ethyl cholride used for anesthesia. 8 cc Lidocaine 1% mixed with 2 mL Depo-Medrol 40 mg injected using the anteromedial approach without difficulty. No complications with procedure and tolerated well. Patient had decreased pain post-injection.  Medication: 2 mL of Depo-Medrol 40 mg, equaling Depo-Medrol 80 mg total  Knee Injection, LEFT Date of procedure: 11/06/2017 Patient verbally consented to procedure. Risks (including potential rare risk of infection), benefits, and alternatives explained. Sterilely prepped with Chloraprep. Ethyl cholride used for anesthesia. 8 cc Lidocaine 1% mixed with 2 mL Depo-Medrol 40 mg injected using the anteromedial approach without difficulty. No complications with procedure and tolerated well. Patient had decreased pain post-injection.  Medication: 2 mL of Depo-Medrol 40 mg, equaling Depo-Medrol 80 mg total   Signed,  Charmane Protzman T. Alga Southall, MD

## 2017-11-15 ENCOUNTER — Encounter: Payer: Self-pay | Admitting: Family Medicine

## 2017-11-15 ENCOUNTER — Other Ambulatory Visit: Payer: Self-pay | Admitting: Family Medicine

## 2017-11-15 DIAGNOSIS — M8589 Other specified disorders of bone density and structure, multiple sites: Secondary | ICD-10-CM | POA: Diagnosis not present

## 2017-11-18 ENCOUNTER — Other Ambulatory Visit: Payer: Self-pay | Admitting: Family Medicine

## 2017-12-14 ENCOUNTER — Other Ambulatory Visit: Payer: Self-pay | Admitting: Family Medicine

## 2017-12-18 ENCOUNTER — Other Ambulatory Visit: Payer: Self-pay | Admitting: Family Medicine

## 2017-12-18 NOTE — Telephone Encounter (Signed)
Last office visit 11/06/2017 with Dr. Lorelei Pont for arthritis both knees.  Last refilled 10/09/2017 for 500 g with 3 refills.  Next Appt: CPE 11/06/2018.

## 2018-01-26 ENCOUNTER — Other Ambulatory Visit: Payer: Self-pay | Admitting: Family Medicine

## 2018-02-27 ENCOUNTER — Telehealth: Payer: Self-pay | Admitting: *Deleted

## 2018-02-27 DIAGNOSIS — G301 Alzheimer's disease with late onset: Principal | ICD-10-CM

## 2018-02-27 DIAGNOSIS — F0280 Dementia in other diseases classified elsewhere without behavioral disturbance: Secondary | ICD-10-CM

## 2018-02-27 NOTE — Telephone Encounter (Signed)
Call family  per my notes in 2014: Aricept, namenda and exelon patches made her nauseated, diarrhea.  There are no other options unless they would like to try low dose oral exelon to see if she tolerates it in place of galantamine.

## 2018-02-27 NOTE — Telephone Encounter (Signed)
Received fax from [facility] stating Galantamine 12 mg tablets are not currently available.  They are requesting an alternative therapy if appropriate.  Please advise.

## 2018-02-27 NOTE — Telephone Encounter (Signed)
Spoke with [place] at [facility].  There are no available strengths available at this time.

## 2018-02-27 NOTE — Telephone Encounter (Signed)
Contact [facility]..  Go they have 4 mg? And take 3 a day?

## 2018-02-28 MED ORDER — RIVASTIGMINE 4.6 MG/24HR TD PT24: 4.6000 mg | MEDICATED_PATCH | Freq: Every day | TRANSDERMAL | 12 refills | Status: DC

## 2018-02-28 NOTE — Addendum Note (Signed)
Addended byEliezer Lofts E on: 02/28/2018 02:11 PM   Modules accepted: Orders

## 2018-02-28 NOTE — Telephone Encounter (Signed)
No answer.  Will try calling again later today or tomorrow.

## 2018-02-28 NOTE — Telephone Encounter (Signed)
Have to start low dose and slowly increase to similar dose of galantamine. If tolerated we can increase to next level of dose in 4 weeks... please let caregiver know to contact us in 4 weeks.

## 2018-02-28 NOTE — Telephone Encounter (Signed)
Spoke with Granddaughter.  She would like to try the Exelon Patch again.  She states they have to crush all her oral medications so she thinks the patch would be easier.  [facility] [place].

## 2018-03-01 NOTE — Telephone Encounter (Signed)
[name] (granddaughter) notified as instructed by telephone.  She will call me back in 4 weeks for increase if Mrs. Northrup tolerates lower dose okay.

## 2018-03-15 ENCOUNTER — Other Ambulatory Visit: Payer: Self-pay | Admitting: Family Medicine

## 2018-03-15 NOTE — Telephone Encounter (Signed)
Last office visit 11/06/2017 for arthritis both knees.  Last refilled 10/09/2017 for 4 g with 3 refills.  CPE scheduled 11/06/2018.

## 2018-03-19 MED ORDER — RIVASTIGMINE 9.5 MG/24HR TD PT24: 9.5000 mg | MEDICATED_PATCH | Freq: Every day | TRANSDERMAL | 11 refills | Status: DC

## 2018-03-19 NOTE — Telephone Encounter (Signed)
Higher dose patches sent in. Remove old patch and change daily. Have caregiver call if any SE on higher dose so we can return to lower dose.

## 2018-03-19 NOTE — Progress Notes (Signed)
   Dr. Frederico Hamman T. Christan Ciccarelli, MD, [facility] Primary Care and Sports Medicine [place], [address] Phone: [phone] Fax: [phone]  03/21/2018  Patient: [name] [name], MRN: [id number], DOB: 15-Jan-1934, 85 y.o.  Primary Physician:  Jinny Sanders, MD   No chief complaint on file.   F/u B knee OA, last with B knee injections with steroids 11/06/2017. With advanced Alzheimer's, think that anything that we can do to increase her functionality and decrease her pain is worthwhile. Family tells me she has had a decreased ability to walk over the last month or so.  Seen in wheelchair. Moderate joint line tenderness.  Knee Injection, RIGHT Date of procedure: 03/21/2018 Patient verbally consented to procedure. Risks (including potential rare risk of infection), benefits, and alternatives explained. Sterilely prepped with Chloraprep. Ethyl cholride used for anesthesia. 8 cc Lidocaine 1% mixed with 2 mL Depo-Medrol 40 mg injected using the anteromedial approach without difficulty. No complications with procedure and tolerated well. Patient had decreased pain post-injection.  Medication: 2 mL of Depo-Medrol 40 mg, equaling Depo-Medrol 80 mg total  Knee Injection, LEFT Date of procedure: 03/21/2018 Patient verbally consented to procedure. Risks (including potential rare risk of infection), benefits, and alternatives explained. Sterilely prepped with Chloraprep. Ethyl cholride used for anesthesia. 8 cc Lidocaine 1% mixed with 2 mL Depo-Medrol 40 mg injected using the anteromedial approach without difficulty. No complications with procedure and tolerated well. Patient had decreased pain post-injection.  Medication: 2 mL of Depo-Medrol 40 mg, equaling Depo-Medrol 80 mg total   Signed,  Benancio Osmundson T. Shalona Harbour, MD

## 2018-03-19 NOTE — Telephone Encounter (Signed)
[name] (grand daughter) called office to let Butch Penny know she is responding to the medication very well. She wanted to let Butch Penny know that she can go ahead with the increase.

## 2018-03-19 NOTE — Addendum Note (Signed)
Addended by: Eliezer Lofts E on: 03/19/2018 09:25 AM   Modules accepted: Orders

## 2018-03-19 NOTE — Telephone Encounter (Signed)
[name] notified as instructed by telephone.

## 2018-03-21 ENCOUNTER — Ambulatory Visit (INDEPENDENT_AMBULATORY_CARE_PROVIDER_SITE_OTHER): Payer: Medicare Other | Admitting: Family Medicine

## 2018-03-21 ENCOUNTER — Encounter: Payer: Self-pay | Admitting: Family Medicine

## 2018-03-21 VITALS — BP 118/60 | HR 75 | Temp 97.6°F | Ht 65.0 in

## 2018-03-21 DIAGNOSIS — M170 Bilateral primary osteoarthritis of knee: Secondary | ICD-10-CM | POA: Diagnosis not present

## 2018-03-21 MED ORDER — METHYLPREDNISOLONE ACETATE 40 MG/ML IJ SUSP
80.0000 mg | Freq: Once | INTRAMUSCULAR | Status: AC
  Administered 2018-03-21: 80 mg via INTRA_ARTICULAR

## 2018-04-17 IMAGING — DX DG CHEST 1V PORT
1 series · 1 of 1 positions shown · non-contrast
Comparison: 07/15/2008

CLINICAL DATA: Suspected sepsis

EXAM:
PORTABLE CHEST 1 VIEW

[chest ap]
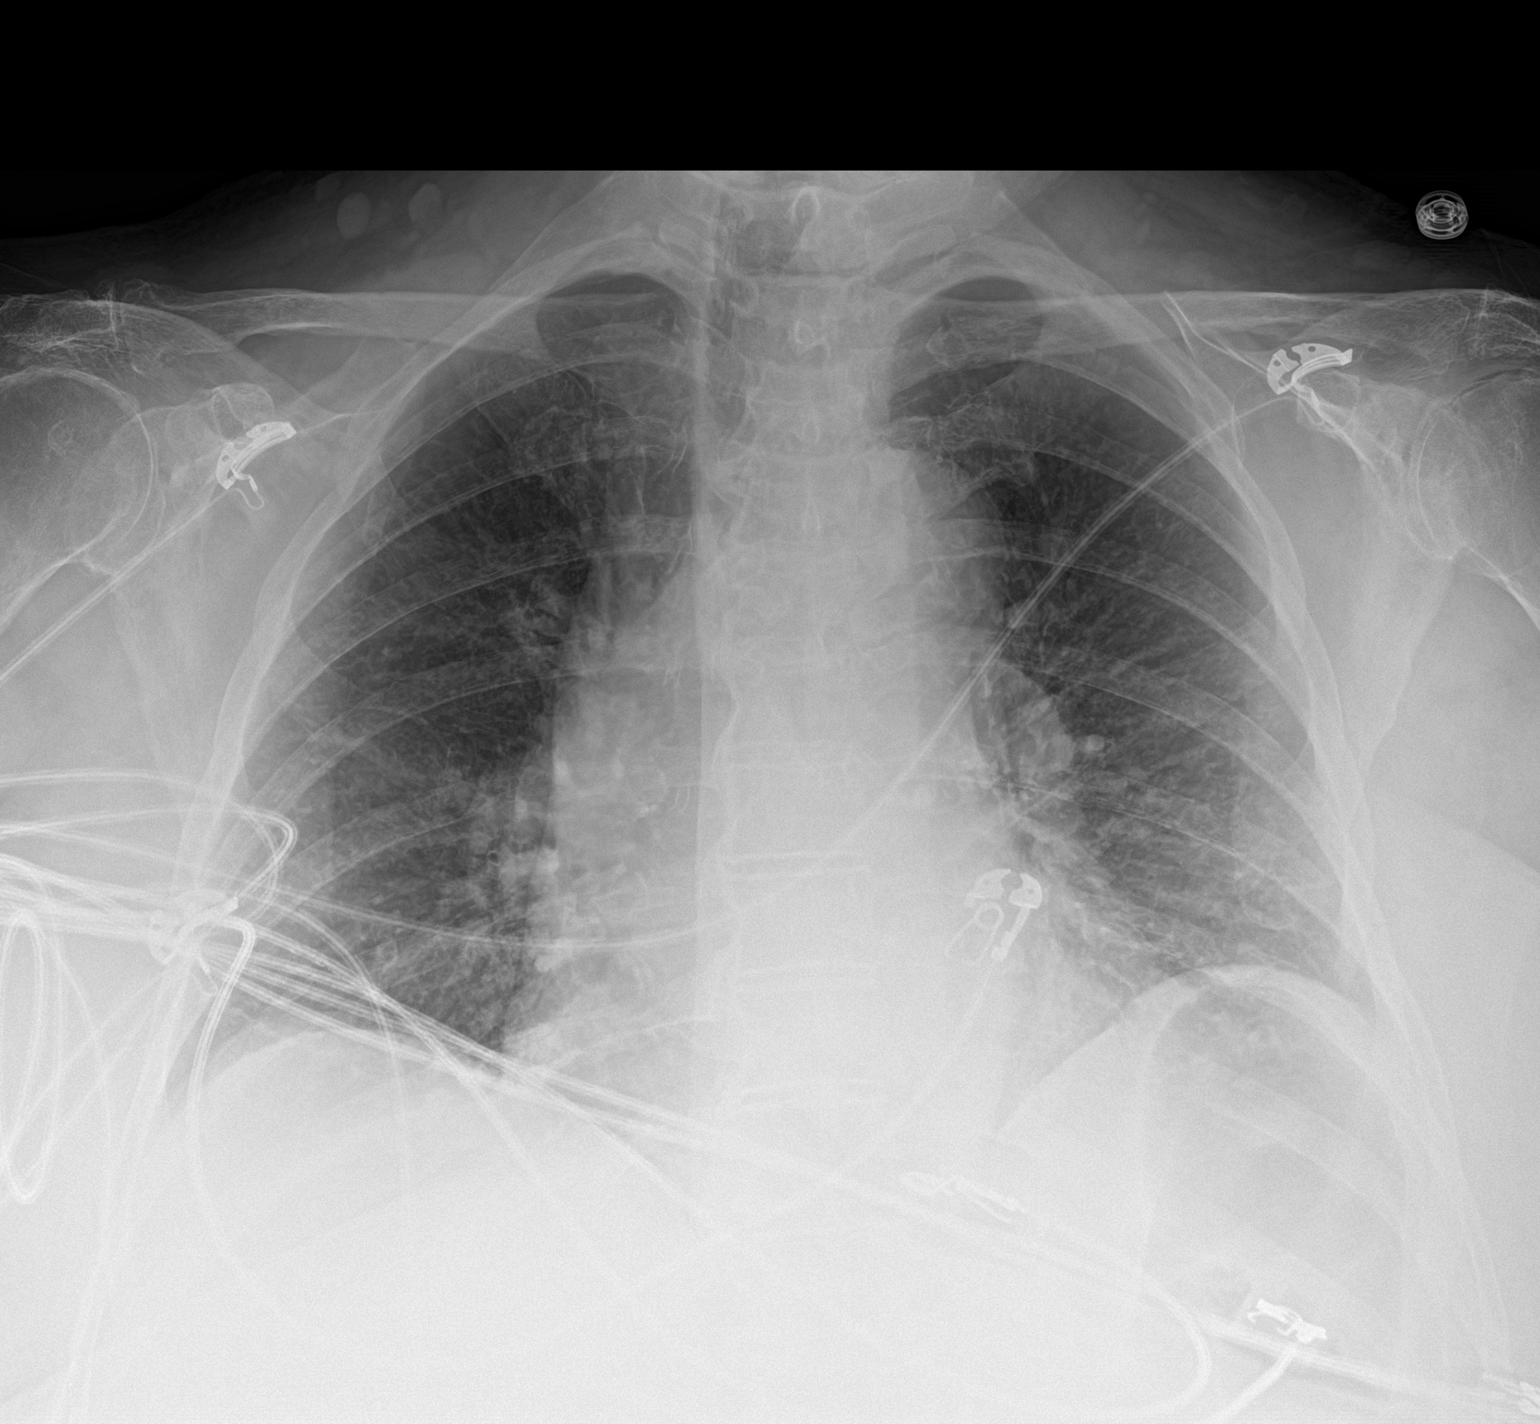

[1 of 1 positions shown; findings below may reference images not displayed]

FINDINGS: Borderline cardiomegaly. Mild central vascular congestion. Mild
diffuse interstitial prominence suggesting edema. No focal
consolidation or effusion. No pneumothorax. Aortic atherosclerosis
IMPRESSION: 1. Low lung volumes.
2. Borderline to mild cardiomegaly with mild central vascular
congestion and diffuse interstitial opacities suggesting minimal
edema
3. No focal pulmonary infiltrate

## 2018-05-13 ENCOUNTER — Other Ambulatory Visit: Payer: Self-pay | Admitting: Family Medicine

## 2018-07-28 ENCOUNTER — Other Ambulatory Visit: Payer: Self-pay | Admitting: Family Medicine

## 2018-08-07 ENCOUNTER — Other Ambulatory Visit: Payer: Self-pay | Admitting: Family Medicine

## 2018-08-14 ENCOUNTER — Telehealth: Payer: Self-pay | Admitting: Family Medicine

## 2018-08-14 NOTE — Telephone Encounter (Signed)
Robert notified as instructed by telephone.  Appointment scheduled for 08/20/2018 at 10:40 am with Dr. Lorelei Pont.  They will call front office upon arrival and I will bring them in the side door next to Dr. Lillie Fragmin room.

## 2018-08-14 NOTE — Telephone Encounter (Signed)
Is  Dr. Lorelei Pont willing to see her.. can they come in side door?  Will she wear a face shield? Probably not

## 2018-08-14 NOTE — Telephone Encounter (Signed)
[name] [name],   I am happy to see this patient.  Helping a severe alzheimer's patient with very bad pain for me is an important thing. It is not realistic that she will wear a mask.  I am completely fine having her come in the side door directly to a room without a mask.  We would have to figure out how to check her in, but it is easy for me to take care of her.

## 2018-08-14 NOTE — Telephone Encounter (Signed)
Pt's grandaughter called to set pt up for injections in her knee. Pt is in a lot of pain. Pt has alzheimer's and will not wear a mas- she is trying to figure ot best way to get pt seen without a mask. They have attempted put a mask on her and she will not wear it. Best number [phone] Griffins and Herbie Baltimore landline [phone] Caren Griffins cell, [phone] cell [name]

## 2018-08-14 NOTE — Telephone Encounter (Signed)
Thank you so much for caring for her!

## 2018-08-19 NOTE — Progress Notes (Signed)
Kayelynn Abdou T. Oceana Walthall, MD Primary Care and [place] at [facility] [place], [address] Phone: [phone]  FAX: [phone]  [name] [name] - 85 y.o. female  MRN [id number]  Date of Birth: [birth year]  Visit Date: 08/20/2018  PCP: Jinny Sanders, MD  Referred by: Jinny Sanders, MD  Chief Complaint  Patient presents with  . Knee Pain    Bilateral   Subjective:   [name] [name] is a 85 y.o. very pleasant female patient who presents with the following:  Pleasant lady with Alz disease who presents with f/u of B knee OA:  Good response to steroid injections in the past.  Additional history is provided by her granddaughter.  The patient does have advanced Alzheimer's.  Her ability to provide history is limited.  The patient also has type 2 diabetes.  Lab Results  Component Value Date   HGBA1C 6.5 11/02/2017     B knee OA injections steroids  Past Medical History, Surgical History, Social History, Family History, Problem List, Medications, and Allergies have been reviewed and updated if relevant.  Patient Active Problem List   Diagnosis Date Noted  . Chronic pain of left knee 04/28/2017  . Lower extremity weakness 04/28/2017  . Hyperkalemia 03/12/2017  . Counseling regarding end of life decision making 10/03/2014  . Controlled type 2 diabetes mellitus with diabetic neuropathy ([place]) 10/03/2014  . Bowel incontinence 01/30/2014  . Chronic venous insufficiency 08/15/2013  . Vitamin D deficiency 02/13/2013  . Neuropathy due to type 2 diabetes mellitus ([place]) 05/17/2011  . Uterine prolapse 11/29/2010  . Urine incontinence 11/29/2010  . Alzheimer's dementia without behavioral disturbance, severe, 05/2016 12/30 07/05/2010  . Breast cancer in situ 07/30/2008    Class: Diagnosis of  . Ductal carcinoma in situ (DCIS) of left breast 07/11/2008  . SEBORRHEIC KERATOSIS 06/20/2007  . HYPERCHOLESTEROLEMIA 05/12/2006  . Essential  hypertension, benign 05/12/2006  . DIVERTICULOSIS, COLON 05/12/2006  . VAGINITIS, ATROPHIC 05/12/2006  . Osteopenia 05/12/2006  . TREMOR, ESSENTIAL 09/20/2005  . SYNCOPE, HX OF 09/20/2005    Past Medical History:  Diagnosis Date  . Alzheimer's dementia ([place])   . Breast cancer, left breast ([place]) 2007  . Diverticulosis   . HTN (hypertension)   . Hyperlipidemia   . Osteopenia   . Pneumonia X 1    Past Surgical History:  Procedure Laterality Date  . BREAST LUMPECTOMY Left 2007  . CATARACT EXTRACTION W/ INTRAOCULAR LENS IMPLANT Right     Social History   Socioeconomic History  . Marital status: Married    Spouse name: Not on file  . Number of children: Not on file  . Years of education: Not on file  . Highest education level: Not on file  Occupational History  . Not on file  Social Needs  . Financial resource strain: Not on file  . Food insecurity    Worry: Not on file    Inability: Not on file  . Transportation needs    Medical: Not on file    Non-medical: Not on file  Tobacco Use  . Smoking status: Former Smoker    Quit date: 02/14/1957    Years since quitting: 61.5  . Smokeless tobacco: Never Used  Substance and Sexual Activity  . Alcohol use: No  . Drug use: No  . Sexual activity: Not Currently    Birth control/protection: Post-menopausal  Lifestyle  . Physical activity    Days per week: Not  on file    Minutes per session: Not on file  . Stress: Not on file  Relationships  . Social Herbalist on phone: Not on file    Gets together: Not on file    Attends religious service: Not on file    Active member of club or organization: Not on file    Attends meetings of clubs or organizations: Not on file    Relationship status: Not on file  . Intimate partner violence    Fear of current or ex partner: Not on file    Emotionally abused: Not on file    Physically abused: Not on file    Forced sexual activity: Not on file  Other Topics Concern  . Not  on file  Social History Narrative  . Not on file    Family History  Problem Relation Age of Onset  . Cancer Brother        Prostate  . Alzheimer's disease Mother   . Stroke Father   . Aneurysm Father   . Colon cancer Neg Hx     Allergies  Allergen Reactions  . Sulfonamide Derivatives     REACTION: rash  . Sulfur     hives    Medication list reviewed and updated in full in [place].  GEN: No fevers, chills. Nontoxic. Primarily MSK c/o today. MSK: Detailed in the HPI GI: tolerating PO intake without difficulty Neuro: No numbness, parasthesias, or tingling associated. Otherwise the pertinent positives of the ROS are noted above.   Objective:   BP 110/64   Pulse 83   Temp 98.3 F (36.8 C) (Temporal)   Ht 5\' 5"  (1.651 m)   SpO2 97%   BMI 33.20 kg/m    GEN: WDWN, NAD, Non-toxic, not orienter HEENT: Atraumatic, Normocephalic.  Ears and Nose: No external deformity. EXTR: No clubbing/cyanosis/edema NEURO: Normal gait.  PSYCH: Normally interactive. Conversant. Not depressed or anxious appearing.  Calm demeanor.    Bilateral knees have joint line tenderness medially and laterally and they have a minimal effusion.  Radiology  No results found.  Assessment and Plan:     ICD-10-CM   1. Primary osteoarthritis of knees, bilateral  M17.0   2. Alzheimer's dementia without behavioral disturbance, unspecified timing of dementia onset ([place])  G30.9    F02.80   3. Controlled type 2 diabetes mellitus with diabetic neuropathy, without long-term current use of insulin (H[facility])  E11.40    Doing well.  Failure of other conservative treatments.  It is reasonable in this end-stage dementia patient with Alzheimer's disease to provide some palliative pain relief.  Aspiration/Injection Procedure Note [name] [name] February 23, 1933 Date of procedure: 08/20/2018  Procedure: Large Joint Joint Aspiration / Injection of the Right Knee Indications: Pain  Procedure Details Patient  verbally consented to procedure. Risks (including potential rare risk of infection), benefits, and alternatives explained. Sterilely prepped with Chloraprep. Ethyl cholride used for anesthesia. 8 cc Lidocaine 1% mixed with 2 mL Depo-Medrol 40 mg injected using the anteromedial approach without difficulty. No complications with procedure and tolerated well. Patient had decreased pain post-injection.  Medication: 2 mL of Depo-Medrol 40 mg, equaling Depo-Medrol 80 mg total  Aspiration/Injection Procedure Note [name] [name] 1933/03/23 Date of procedure: 08/20/2018  Procedure: Large Joint Aspiration / Injection of the Left Knee Indications: Pain  Procedure Details Patient verbally consented to procedure. Risks (including potential rare risk of infection), benefits, and alternatives explained. Sterilely prepped with Chloraprep. Ethyl cholride used for  anesthesia. 8 cc Lidocaine 1% mixed with 2 mL Depo-Medrol 40 mg injected using the anteromedial approach without difficulty. No complications with procedure and tolerated well. Patient had decreased pain post-injection.  Medication: 2 mL of Depo-Medrol 40 mg, equaling Depo-Medrol 80 mg total   Follow-up: No follow-ups on file.  No orders of the defined types were placed in this encounter.  No orders of the defined types were placed in this encounter.   Signed,  Maud Deed. Cienna Dumais, MD   Outpatient Encounter Medications as of 08/20/2018  Medication Sig  . atenolol (TENORMIN) 25 MG tablet TAKE 1 TABLET BY MOUTH EVERY DAY  . AZO-CRANBERRY PO Take 2 tablets by mouth daily.  . B Complex Vitamins (VITAMIN B-COMPLEX PO) Take 1 tablet by mouth daily.  . Calcium Carbonate-Vitamin D (CALCIUM 600+D) 600-200 MG-UNIT TABS Take 1 tablet by mouth 2 (two) times daily.   . diclofenac sodium (VOLTAREN) 1 % GEL APPLY 4 G TOPICALLY 4 (FOUR) TIMES DAILY. TO KNEE  . lisinopril (ZESTRIL) 20 MG tablet TAKE 1 TABLET BY MOUTH EVERY DAY  . rivastigmine (EXELON) 9.5  mg/24hr Place 1 patch (9.5 mg total) onto the skin daily.  . TURMERIC PO Take 1 tablet by mouth daily.  . [DISCONTINUED] diclofenac sodium (VOLTAREN) 1 % GEL APPLY 4 G TOPICALLY 4 (FOUR) TIMES DAILY. TO KNEE   No facility-administered encounter medications on file as of 08/20/2018.

## 2018-08-20 ENCOUNTER — Encounter: Payer: Self-pay | Admitting: Family Medicine

## 2018-08-20 ENCOUNTER — Ambulatory Visit (INDEPENDENT_AMBULATORY_CARE_PROVIDER_SITE_OTHER): Payer: Medicare Other | Admitting: Family Medicine

## 2018-08-20 VITALS — BP 110/64 | HR 83 | Temp 98.3°F | Ht 65.0 in

## 2018-08-20 DIAGNOSIS — F0280 Dementia in other diseases classified elsewhere without behavioral disturbance: Secondary | ICD-10-CM | POA: Diagnosis not present

## 2018-08-20 DIAGNOSIS — G309 Alzheimer's disease, unspecified: Secondary | ICD-10-CM

## 2018-08-20 DIAGNOSIS — E1140 Type 2 diabetes mellitus with diabetic neuropathy, unspecified: Secondary | ICD-10-CM

## 2018-08-20 DIAGNOSIS — M170 Bilateral primary osteoarthritis of knee: Secondary | ICD-10-CM | POA: Diagnosis not present

## 2018-08-20 MED ORDER — METHYLPREDNISOLONE ACETATE 40 MG/ML IJ SUSP
80.0000 mg | Freq: Once | INTRAMUSCULAR | Status: AC
  Administered 2018-08-20: 12:00:00 80 mg via INTRA_ARTICULAR

## 2018-08-20 MED ORDER — METHYLPREDNISOLONE ACETATE 40 MG/ML IJ SUSP
80.0000 mg | Freq: Once | INTRAMUSCULAR | Status: AC
  Administered 2018-08-20: 80 mg via INTRA_ARTICULAR

## 2018-08-20 NOTE — Addendum Note (Signed)
Addended by: Carter Kitten on: 08/20/2018 12:18 PM   Modules accepted: Orders

## 2018-11-01 ENCOUNTER — Telehealth: Payer: Self-pay | Admitting: Family Medicine

## 2018-11-01 DIAGNOSIS — M8580 Other specified disorders of bone density and structure, unspecified site: Secondary | ICD-10-CM

## 2018-11-01 DIAGNOSIS — E1140 Type 2 diabetes mellitus with diabetic neuropathy, unspecified: Secondary | ICD-10-CM

## 2018-11-01 DIAGNOSIS — E559 Vitamin D deficiency, unspecified: Secondary | ICD-10-CM

## 2018-11-01 NOTE — Telephone Encounter (Signed)
-----   Message from Cloyd Stagers, RT sent at 10/23/2018  2:30 PM EDT ----- Regarding: Lab Orders for Friday 9.18.2020 Please place lab orders for Friday 9.18.2020, Doxy.me visit for physical on Tuesday 9.22.2020 Thank you, Dyke Maes RT(R)

## 2018-11-02 ENCOUNTER — Other Ambulatory Visit (INDEPENDENT_AMBULATORY_CARE_PROVIDER_SITE_OTHER): Payer: Medicare Other

## 2018-11-02 DIAGNOSIS — E1140 Type 2 diabetes mellitus with diabetic neuropathy, unspecified: Secondary | ICD-10-CM | POA: Diagnosis not present

## 2018-11-02 DIAGNOSIS — E559 Vitamin D deficiency, unspecified: Secondary | ICD-10-CM | POA: Diagnosis not present

## 2018-11-02 LAB — COMPREHENSIVE METABOLIC PANEL
ALT: 8 U/L (ref 0–35)
AST: 11 U/L (ref 0–37)
Albumin: 3.9 g/dL (ref 3.5–5.2)
Alkaline Phosphatase: 71 U/L (ref 39–117)
BUN: 20 mg/dL (ref 6–23)
CO2: 29 mEq/L (ref 19–32)
Calcium: 9.9 mg/dL (ref 8.4–10.5)
Chloride: 102 mEq/L (ref 96–112)
Creatinine, Ser: 0.91 mg/dL (ref 0.40–1.20)
GFR: 59.04 mL/min — ABNORMAL LOW (ref >60.00)
Glucose, Bld: 101 mg/dL — ABNORMAL HIGH (ref 70–99)
Potassium: 4.5 mEq/L (ref 3.5–5.1)
Sodium: 139 mEq/L (ref 135–145)
Total Bilirubin: 0.4 mg/dL (ref 0.2–1.2)
Total Protein: 6.8 g/dL (ref 6.0–8.3)

## 2018-11-02 LAB — LIPID PANEL
Cholesterol: 230 mg/dL — ABNORMAL HIGH (ref 0–200)
HDL: 37.7 mg/dL — ABNORMAL LOW (ref >39.00)
NonHDL: 192.64
Total CHOL/HDL Ratio: 6
Triglycerides: 293 mg/dL — ABNORMAL HIGH (ref 0.0–149.0)
VLDL: 58.6 mg/dL — ABNORMAL HIGH (ref 0.0–40.0)

## 2018-11-02 LAB — HEMOGLOBIN A1C: Hgb A1c MFr Bld: 6.3 % (ref 4.6–6.5)

## 2018-11-02 LAB — VITAMIN D 25 HYDROXY (VIT D DEFICIENCY, FRACTURES): VITD: 49.42 ng/mL (ref 30.00–100.00)

## 2018-11-02 LAB — LDL CHOLESTEROL, DIRECT: Direct LDL: 157 mg/dL

## 2018-11-02 NOTE — Progress Notes (Signed)
No critical labs need to be addressed urgently. We will discuss labs in detail at upcoming office visit.   

## 2018-11-06 ENCOUNTER — Ambulatory Visit: Payer: Medicare Other

## 2018-11-06 ENCOUNTER — Encounter: Payer: Medicare Other | Admitting: Family Medicine

## 2018-11-06 ENCOUNTER — Ambulatory Visit (INDEPENDENT_AMBULATORY_CARE_PROVIDER_SITE_OTHER): Payer: Medicare Other | Admitting: Family Medicine

## 2018-11-06 ENCOUNTER — Encounter: Payer: Self-pay | Admitting: Family Medicine

## 2018-11-06 VITALS — BP 124/57 | HR 63 | Temp 96.6°F

## 2018-11-06 DIAGNOSIS — F0280 Dementia in other diseases classified elsewhere without behavioral disturbance: Secondary | ICD-10-CM

## 2018-11-06 DIAGNOSIS — E559 Vitamin D deficiency, unspecified: Secondary | ICD-10-CM

## 2018-11-06 DIAGNOSIS — I10 Essential (primary) hypertension: Secondary | ICD-10-CM | POA: Diagnosis not present

## 2018-11-06 DIAGNOSIS — G301 Alzheimer's disease with late onset: Secondary | ICD-10-CM

## 2018-11-06 DIAGNOSIS — E1140 Type 2 diabetes mellitus with diabetic neuropathy, unspecified: Secondary | ICD-10-CM | POA: Diagnosis not present

## 2018-11-06 NOTE — Assessment & Plan Note (Signed)
Stable tolerable control.

## 2018-11-06 NOTE — Progress Notes (Addendum)
VIRTUAL VISIT Due to national recommendations of social distancing due to [place] 19, a virtual visit is felt to be most appropriate for this patient at this time.   I connected with the patient on 11/06/18 at 10:40 AM EDT by virtual telehealth platform and verified that I am speaking with the correct person using two identifiers.   Interactive audio and video telecommunications were attempted between this provider and patient, however failed, due to patient having technical difficulties OR patient did not have access to video capability.  We continued and completed visit with audio only.   Interactive audio and video telecommunications were attempted between this provider and patient, however failed, due to patient having technical difficulties OR patient did not have access to video capability.  We continued and completed visit with audio only.   I discussed the limitations, risks, security and privacy concerns of performing an evaluation and management service by  virtual telehealth platform and the availability of in person appointments. I also discussed with the patient that there may be a patient responsible charge related to this service. The patient expressed understanding and agreed to proceed.  Patient location: Home Provider Location: [facility] Participants: Eliezer Lofts and Hassan Rowan   Chief Complaint  Patient presents with  . Medicare Wellness    History of Present Illness:  DAUGHTER provided majority history for patient:  The patient presents for annual medicare wellness, complete physical and review of chronic health problems. He/She also has the following acute concerns today:  I have personally reviewed the Medicare Annual Wellness questionnaire and have noted 1. The patient's medical and social history 2. Their use of alcohol, tobacco or illicit drugs 3. Their current medications and supplements 4. The patient's functional ability including ADL's, fall  risks, home safety risks and hearing or visual             impairment. 5. Diet and physical activities 6. Evidence for depression or mood disorders 7.         Updated provider list Cognitive evaluation was performed and recorded on pt medicare questionnaire form. The patients weight, height, BMI and visual acuity have been recorded in the chart  I have made referrals, counseling and provided education to the patient based review of the above and I have provided the pt with a written personalized care plan for preventive services.   Documentation of this information was scanned into the electronic record under the media tab.   Advance directives and end of life planning reviewed in detail with patient and documented in EMR. Patient given handout on advance care directives if needed. HCPOA and living will updated if needed.  Fall Risk  11/06/2018 11/02/2017 11/02/2017 10/14/2016 09/24/2015  Falls in the past year? 0 Yes Yes No Yes  Comment - unwitnessed fall in kitchen; laceration to left elbow unwitnessed fall in kitchen; laceration to left elbow - -  Number falls in past yr: - 1 1 - 1  Injury with Fall? - Yes Yes - No  Risk for fall due to : - Impaired balance/gait;Impaired mobility;Mental status change Impaired balance/gait;Impaired mobility;Mental status change - History of fall(s);Impaired balance/gait;Impaired mobility;Mental status change  Follow up - - - - Falls evaluation completed;Education provided;Falls prevention discussed     Office Visit from 11/06/2018 in [place] at [facility] Total Score  0      Unable to do hearing and vision screen.. no gross new issues. Chronic hearing loss and has glasses.  Alzheimer's  dementia:  Stable on exelon patch per daughter.  Hypertension:  Stable control on lisinopril and atenolol.  BP Readings from Last 3 Encounters:  11/06/18 (!) 124/57  08/20/18 110/64  03/21/18 118/60  Using medication without problems or  lightheadedness: none Chest pain with exertion:none Edema: none Short of breath:none Average home BPs: Other issues:  Diabetes:Stable control on no med Lab Results  Component Value Date   HGBA1C 6.3 11/02/2018  Using medications without difficulties: Hypoglycemic episodes: Hyperglycemic episodes: Feet problems: no  ulcers Blood Sugars averaging:not checking eye exam within last year:yes   vit D:good  She is eating 3 meals a day, but small. Minimal liquid intake, daughter is encouraging.  No weight loss. Wt Readings from Last 3 Encounters:  11/06/17 199 lb 8 oz (90.5 kg)  11/02/17 205 lb 8 oz (93.2 kg)  11/02/17 205 lb 8 oz (93.2 kg)    Patient Care Team: Jinny Sanders, MD as PCP - General   COVID 19 screen No recent travel or known exposure to [place] The patient denies respiratory symptoms of COVID 19 at this time.  The importance of social distancing was discussed today.   Review of Systems  Constitutional: Negative for chills and fever.  HENT: Negative for congestion and ear pain.   Eyes: Negative for pain and redness.  Respiratory: Negative for cough and shortness of breath.   Cardiovascular: Negative for chest pain, palpitations and leg swelling.  Gastrointestinal: Negative for abdominal pain, blood in stool, constipation, diarrhea, nausea and vomiting.  Genitourinary: Negative for dysuria.  Musculoskeletal: Negative for falls and myalgias.  Skin: Negative for rash.  Neurological: Negative for dizziness.  Psychiatric/Behavioral: Negative for depression. The patient is not nervous/anxious.       Past Medical History:  Diagnosis Date  . Alzheimer's dementia ([place])   . Breast cancer, left breast ([place]) 2007  . Diverticulosis   . HTN (hypertension)   . Hyperlipidemia   . Osteopenia   . Pneumonia X 1    reports that she quit smoking about 61 years ago. She has never used smokeless tobacco. She reports that she does not drink alcohol or use drugs.   Current  Outpatient Medications:  .  acetaminophen (TYLENOL) 650 MG CR tablet, Take 650 mg by mouth daily., Disp: , Rfl:  .  atenolol (TENORMIN) 25 MG tablet, TAKE 1 TABLET BY MOUTH EVERY DAY, Disp: 90 tablet, Rfl: 0 .  AZO-CRANBERRY PO, Take 2 tablets by mouth daily., Disp: , Rfl:  .  B Complex Vitamins (VITAMIN B-COMPLEX PO), Take 1 tablet by mouth daily., Disp: , Rfl:  .  Calcium Carbonate-Vitamin D (CALCIUM 600+D) 600-200 MG-UNIT TABS, Take 1 tablet by mouth 2 (two) times daily. , Disp: , Rfl:  .  diclofenac sodium (VOLTAREN) 1 % GEL, APPLY 4 G TOPICALLY 4 (FOUR) TIMES DAILY. TO KNEE, Disp: 100 g, Rfl: 1 .  lisinopril (ZESTRIL) 20 MG tablet, TAKE 1 TABLET BY MOUTH EVERY DAY, Disp: 90 tablet, Rfl: 1 .  rivastigmine (EXELON) 9.5 mg/24hr, Place 1 patch (9.5 mg total) onto the skin daily., Disp: 30 patch, Rfl: 11 .  TURMERIC PO, Take 1 tablet by mouth daily., Disp: , Rfl:    Observations/Objective: Blood pressure (!) 124/57, pulse 63, temperature (!) 96.6 F (35.9 C), temperature source Oral.  Physical Exam  Physical Exam Constitutional:      General: The patient is not in acute distress. Pulmonary:     Effort: Pulmonary effort is normal. No respiratory distress.  Neurological:  Mental Status: The patient is alert and oriented to person, place, and time.  Psychiatric:        Mood and Affect: Mood normal.        Behavior: Behavior normal.   Assessment and Plan The patient's preventative maintenance and recommended screening tests for an annual wellness exam were reviewed in full today. Brought up to date unless services declined.  Reviewed health maintenance.. Addressed necessary items.  Discussed vaccines.. Family deferred for pt.  DEXA: stable osteopenia... do not plan to repeat given difficult with test given Alzheimer's   I discussed the assessment and treatment plan with the patient. The patient was provided an opportunity to ask questions and all were answered. The patient agreed  with the plan and demonstrated an understanding of the instructions.   The patient was advised to call back or seek an in-person evaluation if the symptoms worsen or if the condition fails to improve as anticipated.   Alzheimer's dementia without behavioral disturbance, severe, 05/2016 12/30 Well controlled. Continue current medication. Minimal behavioral issues.   Stopped unnecessary meds at previous appts.. no need to continue cholesterol eval.  Controlled type 2 diabetes mellitus with diabetic neuropathy ([place]) Good control on no med.   Essential hypertension, benign Well controlled. Continue current medication.   Neuropathy due to type 2 diabetes mellitus ([facility]) Stable tolerable control.  Vitamin D deficiency Resolved with supplementation.  Total visit time 25 minutes, > 50% spent counseling and cordinating patients care.   Eliezer Lofts, MD

## 2018-11-06 NOTE — Assessment & Plan Note (Signed)
Good control on no med. 

## 2018-11-06 NOTE — Assessment & Plan Note (Signed)
Resolved with supplementation 

## 2018-11-06 NOTE — Assessment & Plan Note (Signed)
Well controlled. Continue current medication.  

## 2018-11-06 NOTE — Patient Instructions (Signed)
Call  o set up 6 month follow up  DM with lab prior

## 2018-11-06 NOTE — Assessment & Plan Note (Addendum)
Well controlled. Continue current medication. Minimal behavioral issues.   Stopped unnecessary meds at previous appts.. no need to continue cholesterol eval.

## 2018-11-13 ENCOUNTER — Other Ambulatory Visit: Payer: Self-pay | Admitting: Family Medicine

## 2018-12-11 ENCOUNTER — Other Ambulatory Visit: Payer: Self-pay | Admitting: Family Medicine

## 2018-12-11 NOTE — Telephone Encounter (Signed)
Last office visit 11/06/2018 for [place].  Last refilled 03/15/2018 for 100 g with 1 refill.  No future appointments.

## 2019-01-08 ENCOUNTER — Other Ambulatory Visit: Payer: Self-pay | Admitting: Family Medicine

## 2019-01-08 NOTE — Telephone Encounter (Signed)
Last office visit 11/06/2018 for [place].  Last refilled 12/11/2018 for 100 g with 1 refills.  No future appointments.

## 2019-01-18 ENCOUNTER — Telehealth: Payer: Self-pay

## 2019-01-18 NOTE — Telephone Encounter (Signed)
[name] (DPR signed) request status of refill for diclofenac 1% gel; advised per med list sent electronically on 01/15/19 to [facility]. [name] will ck with pharmacy.nothing further needed.

## 2019-01-21 ENCOUNTER — Other Ambulatory Visit: Payer: Self-pay | Admitting: Family Medicine

## 2019-01-30 ENCOUNTER — Other Ambulatory Visit: Payer: Self-pay | Admitting: Family Medicine

## 2019-01-30 NOTE — Telephone Encounter (Signed)
Pharmacy is requesting 200 g vs. 100 g.  Ok to approve 200 g?

## 2019-03-26 ENCOUNTER — Other Ambulatory Visit: Payer: Self-pay | Admitting: Family Medicine

## 2019-03-26 NOTE — Telephone Encounter (Signed)
Last office visit 11/06/2018 for [place].  Last refilled 01/31/2019 for 200 g with 1 refill.  No future appointments.

## 2019-04-30 DIAGNOSIS — E119 Type 2 diabetes mellitus without complications: Secondary | ICD-10-CM | POA: Diagnosis not present

## 2019-04-30 DIAGNOSIS — Z79899 Other long term (current) drug therapy: Secondary | ICD-10-CM | POA: Diagnosis not present

## 2019-05-02 DIAGNOSIS — M1711 Unilateral primary osteoarthritis, right knee: Secondary | ICD-10-CM | POA: Diagnosis not present

## 2019-05-02 DIAGNOSIS — M1712 Unilateral primary osteoarthritis, left knee: Secondary | ICD-10-CM | POA: Diagnosis not present

## 2019-05-07 DIAGNOSIS — E875 Hyperkalemia: Secondary | ICD-10-CM | POA: Diagnosis not present

## 2019-05-08 DIAGNOSIS — E875 Hyperkalemia: Secondary | ICD-10-CM | POA: Diagnosis not present

## 2019-10-07 ENCOUNTER — Other Ambulatory Visit: Payer: Self-pay | Admitting: Family Medicine

## 2019-10-08 NOTE — Telephone Encounter (Signed)
Spoke to Herbie Baltimore (son) stated pt could not come in to office.  He stated it was all they could do was get her out of bed into a chair.  He stated they have been using doctors make house calls

## 2019-10-08 NOTE — Telephone Encounter (Signed)
I will refill once, but given past September I will not have seen her in a year.. we need to at least do a virtual visit or phone visit and for further refills.

## 2019-10-08 NOTE — Telephone Encounter (Signed)
Refill

## 2019-10-08 NOTE — Telephone Encounter (Signed)
Please call and schedule appointment to follow up on diabetes with Dr. Diona Browner.

## 2020-01-01 ENCOUNTER — Other Ambulatory Visit: Payer: Self-pay | Admitting: Family Medicine

## 2020-01-01 NOTE — Telephone Encounter (Signed)
Last office visit 11/06/2018 for [name] [name].  Last refilled 10/08/2019 for #90 with no refills.  No future appointments.  Refill?

## 2020-03-28 ENCOUNTER — Other Ambulatory Visit: Payer: Self-pay | Admitting: Family Medicine

## 2020-04-21 DIAGNOSIS — M1711 Unilateral primary osteoarthritis, right knee: Secondary | ICD-10-CM | POA: Diagnosis not present

## 2020-04-21 DIAGNOSIS — G309 Alzheimer's disease, unspecified: Secondary | ICD-10-CM | POA: Diagnosis not present

## 2020-04-21 DIAGNOSIS — I10 Essential (primary) hypertension: Secondary | ICD-10-CM | POA: Diagnosis not present

## 2020-04-21 DIAGNOSIS — M1712 Unilateral primary osteoarthritis, left knee: Secondary | ICD-10-CM | POA: Diagnosis not present

## 2020-08-18 DIAGNOSIS — Z20822 Contact with and (suspected) exposure to covid-19: Secondary | ICD-10-CM | POA: Diagnosis not present

## 2020-11-02 DIAGNOSIS — G309 Alzheimer's disease, unspecified: Secondary | ICD-10-CM | POA: Diagnosis not present

## 2020-11-02 DIAGNOSIS — I10 Essential (primary) hypertension: Secondary | ICD-10-CM | POA: Diagnosis not present

## 2020-11-02 DIAGNOSIS — Z79899 Other long term (current) drug therapy: Secondary | ICD-10-CM | POA: Diagnosis not present

## 2021-01-26 DIAGNOSIS — M179 Osteoarthritis of knee, unspecified: Secondary | ICD-10-CM | POA: Diagnosis not present

## 2021-01-26 DIAGNOSIS — B351 Tinea unguium: Secondary | ICD-10-CM | POA: Diagnosis not present

## 2021-01-26 DIAGNOSIS — Z7189 Other specified counseling: Secondary | ICD-10-CM | POA: Diagnosis not present

## 2021-01-26 DIAGNOSIS — I10 Essential (primary) hypertension: Secondary | ICD-10-CM | POA: Diagnosis not present

## 2021-01-26 DIAGNOSIS — F0390 Unspecified dementia without behavioral disturbance: Secondary | ICD-10-CM | POA: Diagnosis not present

## 2021-01-26 DIAGNOSIS — E119 Type 2 diabetes mellitus without complications: Secondary | ICD-10-CM | POA: Diagnosis not present

## 2021-03-18 DIAGNOSIS — R296 Repeated falls: Secondary | ICD-10-CM | POA: Diagnosis not present

## 2021-03-18 DIAGNOSIS — E119 Type 2 diabetes mellitus without complications: Secondary | ICD-10-CM | POA: Diagnosis not present

## 2021-03-18 DIAGNOSIS — I10 Essential (primary) hypertension: Secondary | ICD-10-CM | POA: Diagnosis not present

## 2021-03-18 DIAGNOSIS — K5909 Other constipation: Secondary | ICD-10-CM | POA: Diagnosis not present

## 2021-05-05 DIAGNOSIS — K5909 Other constipation: Secondary | ICD-10-CM | POA: Diagnosis not present

## 2021-05-05 DIAGNOSIS — E119 Type 2 diabetes mellitus without complications: Secondary | ICD-10-CM | POA: Diagnosis not present

## 2021-05-05 DIAGNOSIS — G250 Essential tremor: Secondary | ICD-10-CM | POA: Diagnosis not present

## 2021-05-05 DIAGNOSIS — I10 Essential (primary) hypertension: Secondary | ICD-10-CM | POA: Diagnosis not present

## 2021-05-06 DIAGNOSIS — Z20822 Contact with and (suspected) exposure to covid-19: Secondary | ICD-10-CM | POA: Diagnosis not present

## 2021-05-07 DIAGNOSIS — I10 Essential (primary) hypertension: Secondary | ICD-10-CM | POA: Diagnosis not present

## 2021-05-07 DIAGNOSIS — E559 Vitamin D deficiency, unspecified: Secondary | ICD-10-CM | POA: Diagnosis not present

## 2021-05-07 DIAGNOSIS — E119 Type 2 diabetes mellitus without complications: Secondary | ICD-10-CM | POA: Diagnosis not present

## 2021-05-07 DIAGNOSIS — F0280 Dementia in other diseases classified elsewhere without behavioral disturbance: Secondary | ICD-10-CM | POA: Diagnosis not present

## 2021-06-02 DIAGNOSIS — Z20822 Contact with and (suspected) exposure to covid-19: Secondary | ICD-10-CM | POA: Diagnosis not present

## 2021-06-19 DIAGNOSIS — Z20822 Contact with and (suspected) exposure to covid-19: Secondary | ICD-10-CM | POA: Diagnosis not present

## 2021-07-28 DIAGNOSIS — G250 Essential tremor: Secondary | ICD-10-CM | POA: Diagnosis not present

## 2021-07-28 DIAGNOSIS — F0390 Unspecified dementia without behavioral disturbance: Secondary | ICD-10-CM | POA: Diagnosis not present

## 2021-07-28 DIAGNOSIS — I10 Essential (primary) hypertension: Secondary | ICD-10-CM | POA: Diagnosis not present

## 2021-09-11 DIAGNOSIS — K5909 Other constipation: Secondary | ICD-10-CM | POA: Diagnosis not present

## 2021-09-11 DIAGNOSIS — G629 Polyneuropathy, unspecified: Secondary | ICD-10-CM | POA: Diagnosis not present

## 2021-09-11 DIAGNOSIS — F0390 Unspecified dementia without behavioral disturbance: Secondary | ICD-10-CM | POA: Diagnosis not present

## 2021-11-09 DIAGNOSIS — E119 Type 2 diabetes mellitus without complications: Secondary | ICD-10-CM | POA: Diagnosis not present

## 2021-11-09 DIAGNOSIS — K5909 Other constipation: Secondary | ICD-10-CM | POA: Diagnosis not present

## 2021-11-09 DIAGNOSIS — I10 Essential (primary) hypertension: Secondary | ICD-10-CM | POA: Diagnosis not present

## 2021-11-09 DIAGNOSIS — R059 Cough, unspecified: Secondary | ICD-10-CM | POA: Diagnosis not present

## 2022-02-08 DIAGNOSIS — F0390 Unspecified dementia without behavioral disturbance: Secondary | ICD-10-CM | POA: Diagnosis not present

## 2022-02-08 DIAGNOSIS — K5909 Other constipation: Secondary | ICD-10-CM | POA: Diagnosis not present

## 2022-02-08 DIAGNOSIS — E119 Type 2 diabetes mellitus without complications: Secondary | ICD-10-CM | POA: Diagnosis not present

## 2022-02-08 DIAGNOSIS — I10 Essential (primary) hypertension: Secondary | ICD-10-CM | POA: Diagnosis not present

## 2022-06-15 DEATH — deceased
